# Patient Record
Sex: Male | Born: 1990 | Race: Black or African American | Hispanic: No | Marital: Single | Smoking: Never smoker
Health system: Southern US, Community
[De-identification: ages and names within clinical notes are randomized; demographics above are authoritative.]

## PROBLEM LIST (undated history)

## (undated) DIAGNOSIS — Z9109 Other allergy status, other than to drugs and biological substances: Secondary | ICD-10-CM

## (undated) DIAGNOSIS — L729 Follicular cyst of the skin and subcutaneous tissue, unspecified: Secondary | ICD-10-CM

## (undated) HISTORY — PX: COLON SURGERY: SHX602

## (undated) SURGERY — Surgical Case
Anesthesia: *Unknown

---
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
## 2012-10-01 ENCOUNTER — Encounter (HOSPITAL_COMMUNITY): Payer: Self-pay | Admitting: *Deleted

## 2012-10-01 ENCOUNTER — Emergency Department (HOSPITAL_COMMUNITY)
Admission: EM | Admit: 2012-10-01 | Discharge: 2012-10-02 | Disposition: A | Discharge status: Home / Self Care | Payer: Self-pay | Attending: Emergency Medicine | Admitting: Emergency Medicine

## 2012-10-01 DIAGNOSIS — J3489 Other specified disorders of nose and nasal sinuses: Secondary | ICD-10-CM | POA: Insufficient documentation

## 2012-10-01 DIAGNOSIS — J209 Acute bronchitis, unspecified: Secondary | ICD-10-CM | POA: Insufficient documentation

## 2012-10-01 DIAGNOSIS — R0981 Nasal congestion: Secondary | ICD-10-CM

## 2012-10-01 DIAGNOSIS — R0789 Other chest pain: Secondary | ICD-10-CM | POA: Insufficient documentation

## 2012-10-01 DIAGNOSIS — R05 Cough: Secondary | ICD-10-CM | POA: Insufficient documentation

## 2012-10-01 DIAGNOSIS — Z9109 Other allergy status, other than to drugs and biological substances: Secondary | ICD-10-CM | POA: Insufficient documentation

## 2012-10-01 DIAGNOSIS — R059 Cough, unspecified: Secondary | ICD-10-CM | POA: Insufficient documentation

## 2012-10-01 DIAGNOSIS — J40 Bronchitis, not specified as acute or chronic: Secondary | ICD-10-CM

## 2012-10-01 HISTORY — DX: Other allergy status, other than to drugs and biological substances: Z91.09

## 2012-10-01 MED ORDER — ALBUTEROL SULFATE HFA 108 (90 BASE) MCG/ACT IN AERS
2.0000 | INHALATION_SPRAY | RESPIRATORY_TRACT | Status: DC | PRN
  Administered 2012-10-01: 2 via RESPIRATORY_TRACT
  Filled 2012-10-01: qty 6.7

## 2012-10-01 MED ORDER — AEROCHAMBER PLUS W/MASK MISC
Status: AC
  Administered 2012-10-01: 1
  Filled 2012-10-01: qty 1

## 2012-10-01 NOTE — ED Notes (Addendum)
C/o cough congestion sob. Onset last week. "can't get a good breath". Throat sore from coughing. Wearing mask. No active coughing at this time. Nasal congestion noted. LS CTA, calm, NAD, alert, speaking in clear complete sentences, throat mildly swollen pink and irritated, VSS.

## 2012-10-01 NOTE — ED Provider Notes (Signed)
History     CSN: [id number]  Arrival date & time 10/01/12  2311   First MD Initiated Contact with Patient 10/01/12 2333      Chief Complaint  Patient presents with  . Shortness of Breath  . Nasal Congestion    (Consider location/radiation/quality/duration/timing/severity/associated sxs/prior treatment) HPI Comments: Patient with SOB on exertion and laying down. He tried his mothers neb machine and it did help for a short period of time. Has no Hx asthma.  States he has facial congestion but no rhinitis of Hx seasonal allergies   Patient is a 22 y.o. male presenting with shortness of breath. The history is provided by the patient.  Shortness of Breath Severity:  Mild Onset quality:  Unable to specify Duration:  10 days Timing:  Intermittent Progression:  Unchanged Chronicity:  New Context: not activity   Relieved by:  Rest Worsened by:  Activity Ineffective treatments:  None tried Associated symptoms: cough   Associated symptoms: no chest pain, no fever, no headaches, no vomiting and no wheezing     Past Medical History  Diagnosis Date  . Environmental allergies     Past Surgical History  Procedure Laterality Date  . Colon surgery      No family history on file.  History  Substance Use Topics  . Smoking status: Never Smoker   . Smokeless tobacco: Not on file  . Alcohol Use: No      Review of Systems  Constitutional: Negative for fever and chills.  HENT: Positive for congestion. Negative for rhinorrhea.   Respiratory: Positive for cough, chest tightness and shortness of breath. Negative for wheezing.   Cardiovascular: Negative for chest pain.  Gastrointestinal: Negative for nausea and vomiting.  Musculoskeletal: Negative for myalgias.  Neurological: Negative for dizziness, weakness and headaches.  All other systems reviewed and are negative.    Allergies  Eggs or egg-derived products and Peanut-containing drug products  Home Medications  No current  outpatient prescriptions on file.  BP 134/73  Pulse 74  Temp(Src) 98.7 F (37.1 C) (Oral)  Resp 16  Ht 6' (1.829 m)  Wt 210 lb (95.255 kg)  BMI 28.47 kg/m2  SpO2 98%  Physical Exam  Nursing note and vitals reviewed. Constitutional: He is oriented to person, place, and time. He appears well-developed and well-nourished.  HENT:  Head: Normocephalic and atraumatic.  Mouth/Throat: Oropharynx is clear and moist.  Eyes: Pupils are equal, round, and reactive to light.  Neck: Normal range of motion.  Cardiovascular: Normal rate and regular rhythm.   Pulmonary/Chest: Effort normal and breath sounds normal. He has no wheezes. He exhibits no tenderness.  Musculoskeletal: Normal range of motion.  Lymphadenopathy:    He has no cervical adenopathy.  Neurological: He is alert and oriented to person, place, and time.  Skin: Skin is warm and dry. No rash noted.    ED Course  Procedures (including critical care time)  Labs Reviewed - No data to display Dg Chest 2 View  10/02/2012   *RADIOLOGY REPORT*  Clinical Data: Cough and congestion; choking sensation.  CHEST - 2 VIEW  Comparison: None.  Findings: The lungs are well-aerated and clear.  There is no evidence of focal opacification, pleural effusion or pneumothorax.  The heart is normal in size; the mediastinal contour is within normal limits.  No acute osseous abnormalities are seen.  IMPRESSION: No acute cardiopulmonary process seen.   Original Report Authenticated By: Tonia Ghent, M.D.     1. Bronchitis   2. Nasal  sinus congestion       MDM           Arman Filter, NP 10/02/12 0110

## 2012-10-02 ENCOUNTER — Emergency Department (HOSPITAL_COMMUNITY): Payer: Self-pay

## 2012-10-02 NOTE — ED Provider Notes (Signed)
Medical screening examination/treatment/procedure(s) were performed by non-physician practitioner and as supervising physician I was immediately available for consultation/collaboration.  Lyanne Co, MD 10/02/12 [phone]

## 2013-03-30 ENCOUNTER — Emergency Department (HOSPITAL_COMMUNITY)
Admission: EM | Admit: 2013-03-30 | Discharge: 2013-03-30 | Disposition: A | Discharge status: Home / Self Care | Payer: Self-pay | Attending: Emergency Medicine | Admitting: Emergency Medicine

## 2013-03-30 ENCOUNTER — Encounter (HOSPITAL_COMMUNITY): Payer: Self-pay | Admitting: Emergency Medicine

## 2013-03-30 DIAGNOSIS — J069 Acute upper respiratory infection, unspecified: Secondary | ICD-10-CM | POA: Insufficient documentation

## 2013-03-30 MED ORDER — ALBUTEROL SULFATE HFA 108 (90 BASE) MCG/ACT IN AERS
2.0000 | INHALATION_SPRAY | Freq: Four times a day (QID) | RESPIRATORY_TRACT | Status: DC | PRN
  Administered 2013-03-30: 2 via RESPIRATORY_TRACT
  Filled 2013-03-30: qty 6.7

## 2013-03-30 MED ORDER — BENZONATATE 100 MG PO CAPS: 100.0000 mg | ORAL_CAPSULE | Freq: Three times a day (TID) | ORAL | Status: DC

## 2013-03-30 MED ORDER — GUAIFENESIN 100 MG/5ML PO LIQD: 100.0000 mg | ORAL | Status: DC | PRN

## 2013-03-30 NOTE — ED Provider Notes (Signed)
CSN: [id number]     Arrival date & time 03/30/13  1603 History  This chart was scribed for non-physician practitioner, Junius Finner, PA-C,working with Raeford Razor, MD, by Karle Plumber, ED Scribe.  This patient was seen in room TR05C/TR05C and the patient's care was started at 5:34 PM.  Chief Complaint  Patient presents with  . Shortness of Breath  . Nasal Congestion   The history is provided by the patient. No language interpreter was used.   HPI Comments:  Martin Stafford is a 22 y.o. male who presents to the Emergency Department complaining of severe pressure in his chest and severe congestion in his head onset one week. He reports an associated cough that keeps him up at night, rhinorrhea and sore throat. Pt reports taking Sudafed yesterday with no relief. He states everyone he lives with is sick as well. Pt denies h/o asthma but states he has intermittent problems breathing. Pt denies face pain, nausea, vomiting, or fever. He denies any recent travel. He states he does not have a PCP.   Past Medical History  Diagnosis Date  . Environmental allergies    Past Surgical History  Procedure Laterality Date  . Colon surgery     No family history on file. History  Substance Use Topics  . Smoking status: Never Smoker   . Smokeless tobacco: Not on file  . Alcohol Use: No    Review of Systems  HENT: Positive for congestion (nasal), rhinorrhea and sore throat.   Respiratory: Positive for cough and shortness of breath.   All other systems reviewed and are negative.   Allergies  Eggs or egg-derived products and Peanut-containing drug products  Home Medications   Current Outpatient Rx  Name  Route  Sig  Dispense  Refill  . benzonatate (TESSALON) 100 MG capsule   Oral   Take 1 capsule (100 mg total) by mouth every 8 (eight) hours.   21 capsule   0   . guaiFENesin (ROBITUSSIN) 100 MG/5ML liquid   Oral   Take 5-10 mLs (100-200 mg total) by mouth every 4 (four) hours as  needed for cough.   60 mL   0    Triage Vitals: BP 147/86  Pulse 87  Temp(Src) 98.4 F (36.9 C) (Oral)  Resp 18  Wt 218 lb 12.8 oz (99.247 kg)  SpO2 97% Physical Exam  Nursing note and vitals reviewed. Constitutional: He is oriented to person, place, and time. He appears well-developed and well-nourished.  HENT:  Head: Normocephalic and atraumatic.  Right Ear: Hearing, tympanic membrane, external ear and ear canal normal.  Left Ear: Hearing, tympanic membrane, external ear and ear canal normal.  Nose: Mucosal edema and rhinorrhea present. Right sinus exhibits no maxillary sinus tenderness and no frontal sinus tenderness. Left sinus exhibits no maxillary sinus tenderness and no frontal sinus tenderness.  Mouth/Throat: Uvula is midline, oropharynx is clear and moist and mucous membranes are normal. Mucous membranes are not pale, not dry and not cyanotic. He does not have dentures. No oral lesions. No trismus in the jaw. Normal dentition. No dental abscesses, uvula swelling, lacerations or dental caries. No oropharyngeal exudate, posterior oropharyngeal edema, posterior oropharyngeal erythema or tonsillar abscesses.  Eyes: EOM are normal.  Neck: Normal range of motion.  Cardiovascular: Normal rate.   Pulmonary/Chest: Effort normal and breath sounds normal. No respiratory distress. He has no wheezes. He has no rales.  Clear to auscultation bilaterally.  Musculoskeletal: Normal range of motion.  Neurological: He is alert  and oriented to person, place, and time.  Skin: Skin is warm and dry.  Psychiatric: He has a normal mood and affect. His behavior is normal.    ED Course  Procedures (including critical care time) DIAGNOSTIC STUDIES: Oxygen Saturation is 97% on RA, normal by my interpretation.   COORDINATION OF CARE: 5:37 PM- Will prescribe a cough medicine. Pt requests prescription for an inhaler. Will provide work note. Advised pt to rest and stay well-hydrated. Also advised to f/u  with PCP to discuss possible need for nebulizer machine at home as pt is concerned he needs one. Pt verbalizes understanding and agrees to plan.  Medications - No data to display  Labs Review Labs Reviewed - No data to display Imaging Review No results found.  EKG Interpretation   None       MDM   1. URI, acute     I personally performed the services described in this documentation, which was scribed in my presence. The recorded information has been reviewed and is accurate.    Junius Finner, PA-C 03/30/13 2340

## 2013-03-30 NOTE — ED Notes (Signed)
Pt discharged.Vital signs stable and GCS 15.Discharge instruction given. 

## 2013-03-30 NOTE — ED Notes (Signed)
C/o sob, runny nose, nasal congestion, and sore throat x 1 week.  States symptoms worse since last night.

## 2013-03-31 NOTE — ED Provider Notes (Signed)
Medical screening examination/treatment/procedure(s) were performed by non-physician practitioner and as supervising physician I was immediately available for consultation/collaboration.  EKG Interpretation   None        Raeford Razor, MD 03/31/13 [phone]

## 2014-10-03 ENCOUNTER — Emergency Department (HOSPITAL_COMMUNITY)
Admission: EM | Admit: 2014-10-03 | Discharge: 2014-10-04 | Disposition: A | Discharge status: Home / Self Care | Payer: Self-pay | Attending: Emergency Medicine | Admitting: Emergency Medicine

## 2014-10-03 ENCOUNTER — Encounter (HOSPITAL_COMMUNITY): Payer: Self-pay | Admitting: *Deleted

## 2014-10-03 DIAGNOSIS — S46819A Strain of other muscles, fascia and tendons at shoulder and upper arm level, unspecified arm, initial encounter: Secondary | ICD-10-CM

## 2014-10-03 DIAGNOSIS — S0990XA Unspecified injury of head, initial encounter: Secondary | ICD-10-CM | POA: Insufficient documentation

## 2014-10-03 DIAGNOSIS — S46919A Strain of unspecified muscle, fascia and tendon at shoulder and upper arm level, unspecified arm, initial encounter: Secondary | ICD-10-CM | POA: Insufficient documentation

## 2014-10-03 DIAGNOSIS — Y92410 Unspecified street and highway as the place of occurrence of the external cause: Secondary | ICD-10-CM | POA: Insufficient documentation

## 2014-10-03 DIAGNOSIS — Y9389 Activity, other specified: Secondary | ICD-10-CM | POA: Insufficient documentation

## 2014-10-03 DIAGNOSIS — Y998 Other external cause status: Secondary | ICD-10-CM | POA: Insufficient documentation

## 2014-10-03 DIAGNOSIS — S29092A Other injury of muscle and tendon of back wall of thorax, initial encounter: Secondary | ICD-10-CM | POA: Insufficient documentation

## 2014-10-03 DIAGNOSIS — S199XXA Unspecified injury of neck, initial encounter: Secondary | ICD-10-CM | POA: Insufficient documentation

## 2014-10-03 MED ORDER — NAPROXEN 500 MG PO TABS: 500.0000 mg | ORAL_TABLET | Freq: Two times a day (BID) | ORAL | Status: DC

## 2014-10-03 MED ORDER — HYDROCODONE-ACETAMINOPHEN 5-325 MG PO TABS: 1.0000 | ORAL_TABLET | ORAL | Status: DC | PRN

## 2014-10-03 MED ORDER — NAPROXEN 500 MG PO TABS
500.0000 mg | ORAL_TABLET | Freq: Once | ORAL | Status: AC
  Administered 2014-10-03: 500 mg via ORAL
  Filled 2014-10-03: qty 1

## 2014-10-03 MED ORDER — HYDROCODONE-ACETAMINOPHEN 5-325 MG PO TABS
1.0000 | ORAL_TABLET | Freq: Once | ORAL | Status: AC
  Administered 2014-10-03: 1 via ORAL
  Filled 2014-10-03: qty 1

## 2014-10-03 NOTE — Discharge Instructions (Signed)
Please follow the directions provided. Be sure to follow-up with your primary care doctor to make sure you're getting better. You may use the naproxen twice a day for pain and inflammation. You may use the Vicodin for pain not relieved by the naproxen. May use ice and heat to help with discomfort. Don't hesitate to return for any new, worsening, or concerning symptoms.    SEEK IMMEDIATE MEDICAL CARE IF:  You have numbness, tingling, or weakness in the arms or legs.  You develop severe headaches not relieved with medicine.  You have severe neck pain, especially tenderness in the middle of the back of your neck.  You have changes in bowel or bladder control.  There is increasing pain in any area of the body.  You have shortness of breath, light-headedness, dizziness, or fainting.  You have chest pain.  You feel sick to your stomach (nauseous), throw up (vomit), or sweat.  You have increasing abdominal discomfort.  There is blood in your urine, stool, or vomit.  You have pain in your shoulder (shoulder strap areas).  You feel your symptoms are getting worse.
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
## 2014-10-03 NOTE — ED Notes (Signed)
Called twice, no response
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
## 2014-12-05 ENCOUNTER — Encounter (HOSPITAL_COMMUNITY): Payer: Self-pay | Admitting: Emergency Medicine

## 2014-12-05 ENCOUNTER — Emergency Department (HOSPITAL_COMMUNITY)
Admission: EM | Admit: 2014-12-05 | Discharge: 2014-12-06 | Disposition: A | Discharge status: Home / Self Care | Payer: Self-pay | Attending: Emergency Medicine | Admitting: Emergency Medicine

## 2014-12-05 DIAGNOSIS — K219 Gastro-esophageal reflux disease without esophagitis: Secondary | ICD-10-CM | POA: Insufficient documentation

## 2014-12-05 DIAGNOSIS — R519 Headache, unspecified: Secondary | ICD-10-CM

## 2014-12-05 DIAGNOSIS — R51 Headache: Secondary | ICD-10-CM | POA: Insufficient documentation

## 2014-12-05 NOTE — ED Notes (Signed)
Pt states for the past week he has been having a lot of acid in his throat and has been unable to eat or drink a lot of fluids because it hurts his esophagus  Pt states he has not eaten in three days  Pt states he has used alka seltzer that helps for a short time but then it hurts worse when it wears off  Pt states he has not tried OTC acid reducers like zantac or prilosec  Pt states he has been having headaches lately too

## 2014-12-06 MED ORDER — IBUPROFEN 800 MG PO TABS
800.0000 mg | ORAL_TABLET | Freq: Once | ORAL | Status: AC
  Administered 2014-12-06: 800 mg via ORAL
  Filled 2014-12-06: qty 1

## 2014-12-06 MED ORDER — GI COCKTAIL ~~LOC~~
30.0000 mL | Freq: Once | ORAL | Status: AC
  Administered 2014-12-06: 30 mL via ORAL
  Filled 2014-12-06: qty 30

## 2014-12-06 MED ORDER — OMEPRAZOLE 20 MG PO CPDR: 20.0000 mg | DELAYED_RELEASE_CAPSULE | Freq: Every day | ORAL | Status: DC

## 2014-12-06 NOTE — ED Notes (Signed)
Patient would like a doctor's note with his discharge paperwork.

## 2014-12-06 NOTE — ED Provider Notes (Signed)
CSN: [id number]     Arrival date & time 12/05/14  2005 History   First MD Initiated Contact with Patient 12/06/14 0005     Chief Complaint  Patient presents with  . Gastrophageal Reflux     (Consider location/radiation/quality/duration/timing/severity/associated sxs/prior Treatment) HPI Comments: Patient is a 24 year old male past medical history significant for headaches, environmental allergies presenting to the emergency department for evaluation of 2 complaints. Patient's first complaint is 4 days of burning epigastric pain with radiation to his chest and throat. He states he feels like he has been tasting acid in his throat. He states eating and drinking worsen his pain. States he has tried Alka-Seltzer helps for short time but eventually wears off. Patient is also complaining of a throbbing headache that he has had on and off for several days. He endorses a history of headaches similar to this one. States he normally takes ibuprofen with relief, has not tried anything in 4 days for his headache. Denies any fevers, nausea, vomiting, syncope.  Patient is a 24 y.o. male presenting with GERD.  Gastrophageal Reflux Associated symptoms include abdominal pain, chest pain and headaches.    Past Medical History  Diagnosis Date  . Environmental allergies    Past Surgical History  Procedure Laterality Date  . Colon surgery     Family History  Problem Relation Age of Onset  . Hypertension Father   . Hypertension Other   . Diabetes Other   . Asthma Mother    History  Substance Use Topics  . Smoking status: Never Smoker   . Smokeless tobacco: Not on file  . Alcohol Use: No    Review of Systems  Eyes: Negative for photophobia and visual disturbance.  Cardiovascular: Positive for chest pain.  Gastrointestinal: Positive for abdominal pain.  Neurological: Positive for headaches.  All other systems reviewed and are negative.     Allergies  Eggs or egg-derived products and  Peanut-containing drug products  Home Medications   Prior to Admission medications   Medication Sig Start Date End Date Taking? Authorizing Provider  aspirin-sod bicarb-citric acid (ALKA-SELTZER) 325 MG TBEF tablet Take 325 mg by mouth every 6 (six) hours as needed (acid reflux).   Yes Historical Provider, MD  benzonatate (TESSALON) 100 MG capsule Take 1 capsule (100 mg total) by mouth every 8 (eight) hours. Patient not taking: Reported on 12/06/2014 03/30/13   Junius Finner, PA-C  guaiFENesin (ROBITUSSIN) 100 MG/5ML liquid Take 5-10 mLs (100-200 mg total) by mouth every 4 (four) hours as needed for cough. Patient not taking: Reported on 12/06/2014 03/30/13   Junius Finner, PA-C  HYDROcodone-acetaminophen (NORCO/VICODIN) 5-325 MG per tablet Take 1 tablet by mouth every 4 (four) hours as needed. Patient not taking: Reported on 12/06/2014 10/03/14   Harle Battiest, NP  naproxen (NAPROSYN) 500 MG tablet Take 1 tablet (500 mg total) by mouth 2 (two) times daily. Patient not taking: Reported on 12/06/2014 10/03/14   Harle Battiest, NP  omeprazole (PRILOSEC) 20 MG capsule Take 1 capsule (20 mg total) by mouth daily. 12/06/14   Jannet Calip, PA-C   BP 146/80 mmHg  Pulse 76  Temp(Src) 98.1 F (36.7 C) (Oral)  Resp 16  SpO2 99% Physical Exam  Constitutional: He is oriented to person, place, and time. He appears well-developed and well-nourished. No distress.  HENT:  Head: Normocephalic and atraumatic.  Right Ear: External ear normal.  Left Ear: External ear normal.  Nose: Nose normal.  Mouth/Throat: Oropharynx is clear and moist.  Eyes: Conjunctivae  are normal.  Neck: Normal range of motion. Neck supple.  No nuchal rigidity.   Cardiovascular: Normal rate, regular rhythm and normal heart sounds.   Pulmonary/Chest: Effort normal and breath sounds normal. No respiratory distress.  Abdominal: Soft. Bowel sounds are normal. There is no tenderness. There is no guarding.   Musculoskeletal: Normal range of motion. He exhibits no edema.  Neurological: He is alert and oriented to person, place, and time.  Skin: Skin is warm and dry. He is not diaphoretic.  Psychiatric: He has a normal mood and affect.  Nursing note and vitals reviewed.   ED Course  Procedures (including critical care time) Medications  ibuprofen (ADVIL,MOTRIN) tablet 800 mg (800 mg Oral Given 12/06/14 0134)  gi cocktail (Maalox,Lidocaine,Donnatal) (30 mLs Oral Given 12/06/14 0134)    Labs Review Labs Reviewed - No data to display  Imaging Review No results found.   EKG Interpretation None      MDM   Final diagnoses:  Gastroesophageal reflux disease, esophagitis presence not specified  Acute nonintractable headache, unspecified headache type   Filed Vitals:   12/06/14 0246  BP: 146/80  Pulse: 76  Temp:   Resp:    Afebrile, NAD, non-toxic appearing, AAOx4.   1) GERD: Symptoms consistent with acid reflux. Low clinical suspicion for cardiac or pulmonary cause of pain. GI cocktail given with improvement. Will start on trial of omeprazole with advised PCP f/u. Symptomatic measures and dietary measures discussed with patient.  2) HA: Pt HA treated and improved while in ED.  Presentation is like pts typical HA and non concerning for [facility], ICH, Meningitis, or temporal arteritis. Pt is afebrile with no focal neuro deficits, nuchal rigidity, or change in vision.   Pt is to follow up with PCP to discuss prophylactic medication. Pt verbalizes understanding and is agreeable with plan to dc.     Francee Piccolo, PA-C 12/06/14 6045  Marisa Severin, MD 12/07/14 [phone]

## 2014-12-06 NOTE — Discharge Instructions (Signed)
Please follow up with your primary care physician in 1-2 days. If you do not have one please call the [facility] number listed above.Please read all discharge instructions and return precautions.   Gastroesophageal Reflux Disease, Adult Gastroesophageal reflux disease (GERD) happens when acid from your stomach flows up into the esophagus. When acid comes in contact with the esophagus, the acid causes soreness (inflammation) in the esophagus. Over time, GERD may create small holes (ulcers) in the lining of the esophagus. CAUSES   Increased body weight. This puts pressure on the stomach, making acid rise from the stomach into the esophagus.  Smoking. This increases acid production in the stomach.  Drinking alcohol. This causes decreased pressure in the lower esophageal sphincter (valve or ring of muscle between the esophagus and stomach), allowing acid from the stomach into the esophagus.  Late evening meals and a full stomach. This increases pressure and acid production in the stomach.  A malformed lower esophageal sphincter. Sometimes, no cause is found. SYMPTOMS   Burning pain in the lower part of the mid-chest behind the breastbone and in the mid-stomach area. This may occur twice a week or more often.  Trouble swallowing.  Sore throat.  Dry cough.  Asthma-like symptoms including chest tightness, shortness of breath, or wheezing. DIAGNOSIS  Your caregiver may be able to diagnose GERD based on your symptoms. In some cases, X-rays and other tests may be done to check for complications or to check the condition of your stomach and esophagus. TREATMENT  Your caregiver may recommend over-the-counter or prescription medicines to help decrease acid production. Ask your caregiver before starting or adding any new medicines.  HOME CARE INSTRUCTIONS   Change the factors that you can control. Ask your caregiver for guidance concerning weight loss, quitting smoking, and  alcohol consumption.  Avoid foods and drinks that make your symptoms worse, such as:  Caffeine or alcoholic drinks.  Chocolate.  Peppermint or mint flavorings.  Garlic and onions.  Spicy foods.  Citrus fruits, such as oranges, lemons, or limes.  Tomato-based foods such as sauce, chili, salsa, and pizza.  Fried and fatty foods.  Avoid lying down for the 3 hours prior to your bedtime or prior to taking a nap.  Eat small, frequent meals instead of large meals.  Wear loose-fitting clothing. Do not wear anything tight around your waist that causes pressure on your stomach.  Raise the head of your bed 6 to 8 inches with wood blocks to help you sleep. Extra pillows will not help.  Only take over-the-counter or prescription medicines for pain, discomfort, or fever as directed by your caregiver.  Do not take aspirin, ibuprofen, or other nonsteroidal anti-inflammatory drugs (NSAIDs). SEEK IMMEDIATE MEDICAL CARE IF:   You have pain in your arms, neck, jaw, teeth, or back.  Your pain increases or changes in intensity or duration.  You develop nausea, vomiting, or sweating (diaphoresis).  You develop shortness of breath, or you faint.  Your vomit is green, yellow, black, or looks like coffee grounds or blood.  Your stool is red, bloody, or black. These symptoms could be signs of other problems, such as heart disease, gastric bleeding, or esophageal bleeding. MAKE SURE YOU:   Understand these instructions.  Will watch your condition.  Will get help right away if you are not doing well or get worse. Document Released: 02/05/2005 Document Revised: 07/21/2011 Document Reviewed: 11/15/2010 [facility] Patient Information 2015 [place], [place]. This information is not intended to replace advice  given to you by your health care provider. Make sure you discuss any questions you have with your health care provider. Food Choices for Gastroesophageal Reflux Disease When you have  gastroesophageal reflux disease (GERD), the foods you eat and your eating habits are very important. Choosing the right foods can help ease the discomfort of GERD. WHAT GENERAL GUIDELINES DO I NEED TO FOLLOW?  Choose fruits, vegetables, whole grains, low-fat dairy products, and low-fat meat, fish, and poultry.  Limit fats such as oils, salad dressings, butter, nuts, and avocado.  Keep a food diary to identify foods that cause symptoms.  Avoid foods that cause reflux. These may be different for different people.  Eat frequent small meals instead of three large meals each day.  Eat your meals slowly, in a relaxed setting.  Limit fried foods.  Cook foods using methods other than frying.  Avoid drinking alcohol.  Avoid drinking large amounts of liquids with your meals.  Avoid bending over or lying down until 2-3 hours after eating. WHAT FOODS ARE NOT RECOMMENDED? The following are some foods and drinks that may worsen your symptoms: Vegetables Tomatoes. Tomato juice. Tomato and spaghetti sauce. Chili peppers. Onion and garlic. Horseradish. Fruits Oranges, grapefruit, and lemon (fruit and juice). Meats High-fat meats, fish, and poultry. This includes hot dogs, ribs, ham, sausage, salami, and bacon. Dairy Whole milk and chocolate milk. Sour cream. Cream. Butter. Ice cream. Cream cheese.  Beverages Coffee and tea, with or without caffeine. Carbonated beverages or energy drinks. Condiments Hot sauce. Barbecue sauce.  Sweets/Desserts Chocolate and cocoa. Donuts. Peppermint and spearmint. Fats and Oils High-fat foods, including [place] fries and potato chips. Other Vinegar. Strong spices, such as black pepper, white pepper, red pepper, cayenne, curry powder, cloves, ginger, and chili powder. The items listed above may not be a complete list of foods and beverages to avoid. Contact your dietitian for more information. Document Released: 04/28/2005 Document Revised: 05/03/2013  Document Reviewed: 03/02/2013 [facility] Patient Information 2015 [place], [place]. This information is not intended to replace advice given to you by your health care provider. Make sure you discuss any questions you have with your health care provider.

## 2015-10-28 ENCOUNTER — Emergency Department (HOSPITAL_COMMUNITY): Payer: No Typology Code available for payment source

## 2015-10-28 ENCOUNTER — Emergency Department (HOSPITAL_COMMUNITY)
Admission: EM | Admit: 2015-10-28 | Discharge: 2015-10-28 | Disposition: A | Discharge status: Home / Self Care | Payer: No Typology Code available for payment source | Attending: Emergency Medicine | Admitting: Emergency Medicine

## 2015-10-28 ENCOUNTER — Encounter (HOSPITAL_COMMUNITY): Payer: Self-pay | Admitting: Emergency Medicine

## 2015-10-28 DIAGNOSIS — M545 Low back pain, unspecified: Secondary | ICD-10-CM

## 2015-10-28 DIAGNOSIS — M79602 Pain in left arm: Secondary | ICD-10-CM | POA: Insufficient documentation

## 2015-10-28 DIAGNOSIS — Y9389 Activity, other specified: Secondary | ICD-10-CM | POA: Insufficient documentation

## 2015-10-28 DIAGNOSIS — Z79899 Other long term (current) drug therapy: Secondary | ICD-10-CM | POA: Insufficient documentation

## 2015-10-28 DIAGNOSIS — Z791 Long term (current) use of non-steroidal anti-inflammatories (NSAID): Secondary | ICD-10-CM | POA: Insufficient documentation

## 2015-10-28 DIAGNOSIS — Y999 Unspecified external cause status: Secondary | ICD-10-CM | POA: Insufficient documentation

## 2015-10-28 DIAGNOSIS — Y92410 Unspecified street and highway as the place of occurrence of the external cause: Secondary | ICD-10-CM | POA: Insufficient documentation

## 2015-10-28 DIAGNOSIS — Z7982 Long term (current) use of aspirin: Secondary | ICD-10-CM | POA: Insufficient documentation

## 2015-10-28 MED ORDER — IBUPROFEN 800 MG PO TABS: 800.0000 mg | ORAL_TABLET | Freq: Three times a day (TID) | ORAL | Status: DC

## 2015-10-28 MED ORDER — IBUPROFEN 800 MG PO TABS
800.0000 mg | ORAL_TABLET | Freq: Once | ORAL | Status: AC
  Administered 2015-10-28: 800 mg via ORAL
  Filled 2015-10-28: qty 1

## 2015-10-28 MED ORDER — METHOCARBAMOL 500 MG PO TABS: 500.0000 mg | ORAL_TABLET | Freq: Two times a day (BID) | ORAL | Status: DC
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
## 2017-07-27 ENCOUNTER — Emergency Department (INDEPENDENT_AMBULATORY_CARE_PROVIDER_SITE_OTHER): Payer: BLUE CROSS/BLUE SHIELD

## 2017-07-27 ENCOUNTER — Encounter: Payer: Self-pay | Admitting: Emergency Medicine

## 2017-07-27 ENCOUNTER — Emergency Department (INDEPENDENT_AMBULATORY_CARE_PROVIDER_SITE_OTHER)
Admission: EM | Admit: 2017-07-27 | Discharge: 2017-07-27 | Disposition: A | Discharge status: Home / Self Care | Payer: BLUE CROSS/BLUE SHIELD | Source: Home / Self Care | Attending: Emergency Medicine | Admitting: Emergency Medicine

## 2017-07-27 DIAGNOSIS — J0120 Acute ethmoidal sinusitis, unspecified: Secondary | ICD-10-CM | POA: Diagnosis not present

## 2017-07-27 DIAGNOSIS — R05 Cough: Secondary | ICD-10-CM

## 2017-07-27 DIAGNOSIS — R509 Fever, unspecified: Secondary | ICD-10-CM | POA: Diagnosis not present

## 2017-07-27 MED ORDER — AMOXICILLIN 500 MG PO CAPS: 500.0000 mg | ORAL_CAPSULE | Freq: Three times a day (TID) | ORAL | 0 refills | Status: DC

## 2017-07-27 MED ORDER — IBUPROFEN 800 MG PO TABS: 800.0000 mg | ORAL_TABLET | Freq: Three times a day (TID) | ORAL | 0 refills | Status: DC

## 2017-07-27 NOTE — ED Provider Notes (Signed)
[facility]    CSN: [id number] Arrival date & time: 07/27/17  1234     History   Chief Complaint Chief Complaint  Patient presents with  . Chills    HPI Martin Stafford is a 27 y.o. male.   The history is provided by the patient. No language interpreter was used.  Fever  Max temp prior to arrival:  101 Temp source:  Subjective Severity:  Moderate Onset quality:  Gradual Duration:  1 week Timing:  Constant Progression:  Worsening Chronicity:  New Relieved by:  Nothing Worsened by:  Nothing Ineffective treatments:  None tried Associated symptoms: chills   Associated symptoms: no chest pain   Risk factors: recent sickness   Risk factors: no contaminated food, no contaminated water, no hx of cancer, no immunosuppression and no sick contacts    Pt was diagnosed with a viral illness at [facility] 5 days ago.  Flu test was negative strep was negative.  Pt complains of continued headache,  Fever on and off and chills and sweating at night. Past Medical History:  Diagnosis Date  . Environmental allergies     There are no active problems to display for this patient.   Past Surgical History:  Procedure Laterality Date  . COLON SURGERY         Home Medications    Prior to Admission medications   Medication Sig Start Date End Date Taking? Authorizing Provider  amoxicillin (AMOXIL) 500 MG capsule Take 1 capsule (500 mg total) by mouth 3 (three) times daily. 07/27/17   Elson Areas, PA-C  aspirin-sod bicarb-citric acid (ALKA-SELTZER) 325 MG TBEF tablet Take 325 mg by mouth every 6 (six) hours as needed (acid reflux).    [provider]  benzonatate (TESSALON) 100 MG capsule Take 1 capsule (100 mg total) by mouth every 8 (eight) hours. Patient not taking: Reported on 12/06/2014 03/30/13   Lurene Shadow, PA-C  guaiFENesin (ROBITUSSIN) 100 MG/5ML liquid Take 5-10 mLs (100-200 mg total) by mouth every 4 (four) hours as needed for cough. Patient not  taking: Reported on 12/06/2014 03/30/13   Lurene Shadow, PA-C  HYDROcodone-acetaminophen (NORCO/VICODIN) 5-325 MG per tablet Take 1 tablet by mouth every 4 (four) hours as needed. Patient not taking: Reported on 12/06/2014 10/03/14   Harle Battiest, NP  ibuprofen (ADVIL,MOTRIN) 800 MG tablet Take 1 tablet (800 mg total) by mouth 3 (three) times daily. 07/27/17   Elson Areas, PA-C  methocarbamol (ROBAXIN) 500 MG tablet Take 1 tablet (500 mg total) by mouth 2 (two) times daily. 10/28/15   Barrett, Rolm Gala, PA-C  naproxen (NAPROSYN) 500 MG tablet Take 1 tablet (500 mg total) by mouth 2 (two) times daily. Patient not taking: Reported on 12/06/2014 10/03/14   Harle Battiest, NP  omeprazole (PRILOSEC) 20 MG capsule Take 1 capsule (20 mg total) by mouth daily. 12/06/14   Piepenbrink, Victorino Dike, PA-C    Family History Family History  Problem Relation Age of Onset  . Hypertension Father   . Hypertension Other   . Diabetes Other   . Asthma Mother     Social History Social History   Tobacco Use  . Smoking status: Never Smoker  . Smokeless tobacco: Never Used  Substance Use Topics  . Alcohol use: No  . Drug use: No     Allergies   Eggs or egg-derived products and Peanut-containing drug products   Review of Systems Review of Systems  Constitutional: Positive for chills and fever.  Cardiovascular: Negative  for chest pain.  All other systems reviewed and are negative.    Physical Exam Triage Vital Signs ED Triage Vitals [07/27/17 1315]  Enc Vitals Group     BP 129/81     Pulse Rate 76     Resp      Temp 97.8 F (36.6 C)     Temp Source Oral     SpO2 99 %     Weight 233 lb (105.7 kg)     Height      Head Circumference      Peak Flow      Pain Score 0     Pain Loc      Pain Edu?      Excl. in GC?    No data found.  Updated Vital Signs BP 129/81 (BP Location: Right Arm)   Pulse 76   Temp 97.8 F (36.6 C) (Oral)   Wt 233 lb (105.7 kg)   SpO2 99%   BMI 31.60  kg/m   Visual Acuity Right Eye Distance:   Left Eye Distance:   Bilateral Distance:    Right Eye Near:   Left Eye Near:    Bilateral Near:     Physical Exam  Constitutional: He appears well-developed and well-nourished.  HENT:  Head: Normocephalic and atraumatic.  Eyes: Conjunctivae are normal.  Neck: Neck supple.  Cardiovascular: Normal rate and regular rhythm.  No murmur heard. Pulmonary/Chest: Effort normal and breath sounds normal. No respiratory distress.  Abdominal: Soft. There is no tenderness.  Musculoskeletal: He exhibits no edema.  Neurological: He is alert.  Skin: Skin is warm and dry.  Psychiatric: He has a normal mood and affect.  Nursing note and vitals reviewed.    UC Treatments / Results  Labs (all labs ordered are listed, but only abnormal results are displayed) Labs Reviewed - No data to display  EKG  EKG Interpretation None       Radiology Dg Chest 2 View  Result Date: 07/27/2017 CLINICAL DATA:  Cough and fever for the past week. EXAM: CHEST - 2 VIEW COMPARISON:  Chest x-ray dated Oct 02, 2012. FINDINGS: The heart size and mediastinal contours are within normal limits. Both lungs are clear. The visualized skeletal structures are unremarkable. IMPRESSION: No active cardiopulmonary disease. Electronically Signed   By: Obie Dredge M.D.   On: 07/27/2017 13:35    Procedures Procedures (including critical care time)  Medications Ordered in UC Medications - No data to display   Initial Impression / Assessment and Plan / UC Course  I have reviewed the triage vital signs and the nursing notes.  Pertinent labs & imaging results that were available during my care of the patient were reviewed by me and considered in my medical decision making (see chart for details).     MDM pt looks well chest xray is normal.  No signs of meningitis.  Pt may have sinus issues causing his headache.  I will treat him with amoxidcillian and ibuprofen.  I advised  1 week recheck if not resolved.  To ED if symptoms worsen An After Visit Summary was printed and given to the patient. Meds ordered this encounter  Medications  . ibuprofen (ADVIL,MOTRIN) 800 MG tablet    Sig: Take 1 tablet (800 mg total) by mouth 3 (three) times daily.    Dispense:  21 tablet    Refill:  0    Order Specific Question:   Supervising Provider    Answer:   Georgina Pillion, DAVID [  [id number]]  . amoxicillin (AMOXIL) 500 MG capsule    Sig: Take 1 capsule (500 mg total) by mouth 3 (three) times daily.    Dispense:  30 capsule    Refill:  0    Order Specific Question:   Supervising Provider    Answer:   Georgina PillionMASSEY, DAVID [[id number]]     Final Clinical Impressions(s) / UC Diagnoses   Final diagnoses:  Acute ethmoidal sinusitis, recurrence not specified    ED Discharge Orders        Ordered    ibuprofen (ADVIL,MOTRIN) 800 MG tablet  3 times daily     07/27/17 1341    amoxicillin (AMOXIL) 500 MG capsule  3 times daily     07/27/17 1341       Controlled Substance Prescriptions [place] Controlled Substance Registry consulted? Not Applicable   Elson AreasSofia, Lenzi Marmo K, [place]PA-C 07/27/17 1500

## 2017-07-27 NOTE — Discharge Instructions (Signed)
See your Physician for recheck in 1 week  °

## 2017-07-27 NOTE — ED Triage Notes (Signed)
Pt c/o chills and HA since last wed. States he was seen at [facility] for a syncope episode. States All labs WNL.

## 2017-07-29 ENCOUNTER — Other Ambulatory Visit: Payer: Self-pay

## 2017-07-29 ENCOUNTER — Encounter: Payer: Self-pay | Admitting: *Deleted

## 2017-07-29 ENCOUNTER — Telehealth: Payer: Self-pay | Admitting: *Deleted

## 2017-07-29 ENCOUNTER — Emergency Department (INDEPENDENT_AMBULATORY_CARE_PROVIDER_SITE_OTHER)
Admission: EM | Admit: 2017-07-29 | Discharge: 2017-07-29 | Disposition: A | Discharge status: Home / Self Care | Payer: BLUE CROSS/BLUE SHIELD | Source: Home / Self Care | Attending: Family Medicine | Admitting: Family Medicine

## 2017-07-29 DIAGNOSIS — H8110 Benign paroxysmal vertigo, unspecified ear: Secondary | ICD-10-CM | POA: Diagnosis not present

## 2017-07-29 MED ORDER — MECLIZINE HCL 25 MG PO TABS: ORAL_TABLET | ORAL | 0 refills | Status: DC

## 2017-07-29 NOTE — ED Provider Notes (Signed)
[name]    CSN: [id number] Arrival date & time: 07/29/17  1322     History   Chief Complaint Chief Complaint  Patient presents with  . Dizziness    HPI Martin Stafford is a 27 y.o. male.   Patient was treated for possible sinusitis two days ago.   Since then his fever and chills have resolved, his energy has improved, and he feels better.  He awoke this morning feeling off balance with changes in position.  No headache.  No nausea/vomiting.   The history is provided by the patient.    Past Medical History:  Diagnosis Date  . Environmental allergies     There are no active problems to display for this patient.   Past Surgical History:  Procedure Laterality Date  . COLON SURGERY         Home Medications    Prior to Admission medications   Medication Sig Start Date End Date Taking? Authorizing Provider  amoxicillin (AMOXIL) 500 MG capsule Take 1 capsule (500 mg total) by mouth 3 (three) times daily. 07/27/17   Elson Areas, PA-C  aspirin-sod bicarb-citric acid (ALKA-SELTZER) 325 MG TBEF tablet Take 325 mg by mouth every 6 (six) hours as needed (acid reflux).    [provider]  ibuprofen (ADVIL,MOTRIN) 800 MG tablet Take 1 tablet (800 mg total) by mouth 3 (three) times daily. 07/27/17   Elson Areas, PA-C  meclizine (ANTIVERT) 25 MG tablet Take one tab by mouth one to two times daily as needed for dizziness 07/29/17   Lattie Haw, MD  methocarbamol (ROBAXIN) 500 MG tablet Take 1 tablet (500 mg total) by mouth 2 (two) times daily. 10/28/15   Barrett, Rolm Gala, PA-C  omeprazole (PRILOSEC) 20 MG capsule Take 1 capsule (20 mg total) by mouth daily. 12/06/14   Piepenbrink, Victorino Dike, PA-C    Family History Family History  Problem Relation Age of Onset  . Hypertension Father   . Hypertension Other   . Diabetes Other   . Asthma Mother     Social History Social History   Tobacco Use  . Smoking status: Never Smoker  . Smokeless  tobacco: Never Used  Substance Use Topics  . Alcohol use: No  . Drug use: No     Allergies   Eggs or egg-derived products and Peanut-containing drug products   Review of Systems Review of Systems   No sore throat No cough No pleuritic pain No wheezing ? nasal congestion No post-nasal drainage No sinus pain/pressure No itchy/red eyes No earache + dizziness No hemoptysis No SOB No fever/chills No nausea No vomiting No abdominal pain No diarrhea No urinary symptoms No skin rash + fatigue No myalgias No headache    Physical Exam Triage Vital Signs ED Triage Vitals  Enc Vitals Group     BP 07/29/17 1340 131/81     Pulse Rate 07/29/17 1340 84     Resp 07/29/17 1340 18     Temp 07/29/17 1340 (!) 97.4 F (36.3 C)     Temp Source 07/29/17 1340 Oral     SpO2 07/29/17 1340 99 %     Weight 07/29/17 1341 235 lb (106.6 kg)     Height 07/29/17 1341 6' (1.829 m)     Head Circumference --      Peak Flow --      Pain Score 07/29/17 1341 0     Pain Loc --      Pain Edu? --  Excl. in GC? --    No data found.  Updated Vital Signs BP 131/81 (BP Location: Right Arm)   Pulse 84   Temp (!) 97.4 F (36.3 C) (Oral)   Resp 18   Ht 6' (1.829 m)   Wt 235 lb (106.6 kg)   SpO2 99%   BMI 31.87 kg/m   Visual Acuity Right Eye Distance:   Left Eye Distance:   Bilateral Distance:    Right Eye Near:   Left Eye Near:    Bilateral Near:     Physical Exam Nursing notes and Vital Signs reviewed. Appearance:  Patient appears stated age, and in no acute distress Eyes:  Pupils are equal, round, and reactive to light and accomodation.  Fundi benign.  No definite nystagmus.  Extraocular movement is intact.  Conjunctivae are not inflamed  Ears:  Canals normal.  Tympanic membranes normal.  Nose:  Mildly congested turbinates.  No sinus tenderness.   Pharynx:  Normal Neck:  Supple.  Enlarged nontender posterior/lateral nodes are palpated bilaterally.   Lungs:  Clear to  auscultation.  Breath sounds are equal.  Moving air well. Heart:  Regular rate and rhythm without murmurs, rubs, or gallops.  Abdomen:  Nontender without masses or hepatosplenomegaly.  Bowel sounds are present.  No CVA or flank tenderness.  Extremities:  No edema.  Skin:  No rash present.   Neurologic:  Cranial nerves 2 through 12 are normal.    UC Treatments / Results  Labs (all labs ordered are listed, but only abnormal results are displayed) Labs Reviewed - No data to display  EKG  EKG Interpretation None       Radiology No results found.  Procedures Procedures (including critical care time)  Medications Ordered in UC Medications - No data to display   Initial Impression / Assessment and Plan / UC Course  I have reviewed the triage vital signs and the nursing notes.  Pertinent labs & imaging results that were available during my care of the patient were reviewed by me and considered in my medical decision making (see chart for details).    Normal exam today; suspect resolving viral illness.   Finish amoxicillin. Given Rx for Antivert to hold.   Followup with Family Doctor if not improved in one week.                                              Final Clinical Impressions(s) / UC Diagnoses   Final diagnoses:  Benign paroxysmal positional vertigo, unspecified laterality    ED Discharge Orders        Ordered    meclizine (ANTIVERT) 25 MG tablet     07/29/17 1404          Lattie HawBeese, Ezekeil Bethel A, MD 07/29/17 1426

## 2017-07-29 NOTE — ED Triage Notes (Signed)
Pt c/o dizziness x today. He reports feeling better and fever has resolved.

## 2017-07-29 NOTE — Discharge Instructions (Signed)
Finish amoxicillin

## 2017-07-29 NOTE — Telephone Encounter (Signed)
Patient called requesting an extension on his work note. He is not much improved. I extended for 1 day. Advised he would need f/u if he needed further days. Note left at front desk.

## 2017-10-28 ENCOUNTER — Emergency Department (INDEPENDENT_AMBULATORY_CARE_PROVIDER_SITE_OTHER)
Admission: EM | Admit: 2017-10-28 | Discharge: 2017-10-28 | Disposition: A | Discharge status: Home / Self Care | Payer: BLUE CROSS/BLUE SHIELD | Source: Home / Self Care | Attending: Family Medicine | Admitting: Family Medicine

## 2017-10-28 ENCOUNTER — Encounter: Payer: Self-pay | Admitting: Emergency Medicine

## 2017-10-28 ENCOUNTER — Other Ambulatory Visit: Payer: Self-pay

## 2017-10-28 DIAGNOSIS — R197 Diarrhea, unspecified: Secondary | ICD-10-CM

## 2017-10-28 DIAGNOSIS — R112 Nausea with vomiting, unspecified: Secondary | ICD-10-CM | POA: Diagnosis not present

## 2017-10-28 DIAGNOSIS — R1013 Epigastric pain: Secondary | ICD-10-CM

## 2017-10-28 MED ORDER — ONDANSETRON HCL 4 MG PO TABS: 4.0000 mg | ORAL_TABLET | Freq: Four times a day (QID) | ORAL | 0 refills | Status: DC

## 2017-10-28 NOTE — Discharge Instructions (Signed)
°  Be sure to stay well hydrated and stick with a small bland diet until you start to feel better. Bread, Rice, Apple Sauce, Toast and similar bland food is good. Avoid fried fatty food or diary products until least 24 hours after symptoms resolve to help your stomach rest.  Please follow up with family medicine in 2-3 days if not improving.

## 2017-10-28 NOTE — ED Triage Notes (Signed)
Reports having nausea, vomiting and diarrhea late yesterday morning [facility] [employer]; and this has subsided but he has epigastric pain now. No OTCs.

## 2017-10-28 NOTE — ED Provider Notes (Signed)
[name]    CSN: [id number] Arrival date & time: 10/28/17  1434     History   Chief Complaint Chief Complaint  Patient presents with  . Emesis  . Diarrhea  . Abdominal Pain    HPI Martin Stafford is a 27 y.o. male.   HPI  Martin Stafford is a 27 y.o. male presenting to UC with c/o n/v/d that started yesterday about 1 hour after eating breakfast at [employer].  He had 5-6 episodes of vomiting and watery diarrhea yesterday w/o blood or mucous in the stool.  Denies fever or chills. No vomiting today but mild nausea and 2-3 episodes of loose stool. He now has generalized abdominal soreness, worse in the epigastrium.  He has been able to keep down Gatorade and water today.  Denies known sick contacts or recent travel. He has not tried anything at home for his symptoms.    Past Medical History:  Diagnosis Date  . Environmental allergies     There are no active problems to display for this patient.   Past Surgical History:  Procedure Laterality Date  . COLON SURGERY         Home Medications    Prior to Admission medications   Medication Sig Start Date End Date Taking? Authorizing Provider  amoxicillin (AMOXIL) 500 MG capsule Take 1 capsule (500 mg total) by mouth 3 (three) times daily. 07/27/17   Elson Areas, PA-C  aspirin-sod bicarb-citric acid (ALKA-SELTZER) 325 MG TBEF tablet Take 325 mg by mouth every 6 (six) hours as needed (acid reflux).    [provider]  ibuprofen (ADVIL,MOTRIN) 800 MG tablet Take 1 tablet (800 mg total) by mouth 3 (three) times daily. 07/27/17   Elson Areas, PA-C  meclizine (ANTIVERT) 25 MG tablet Take one tab by mouth one to two times daily as needed for dizziness 07/29/17   Lattie Haw, MD  methocarbamol (ROBAXIN) 500 MG tablet Take 1 tablet (500 mg total) by mouth 2 (two) times daily. 10/28/15   Barrett, Rolm Gala, PA-C  omeprazole (PRILOSEC) 20 MG capsule Take 1 capsule (20 mg total) by mouth daily. 12/06/14    Piepenbrink, Victorino Dike, PA-C  ondansetron (ZOFRAN) 4 MG tablet Take 1 tablet (4 mg total) by mouth every 6 (six) hours. 10/28/17   Lurene Shadow, PA-C    Family History Family History  Problem Relation Age of Onset  . Hypertension Father   . Hypertension Other   . Diabetes Other   . Asthma Mother     Social History Social History   Tobacco Use  . Smoking status: Never Smoker  . Smokeless tobacco: Never Used  Substance Use Topics  . Alcohol use: No  . Drug use: No     Allergies   Eggs or egg-derived products and Peanut-containing drug products   Review of Systems Review of Systems  Constitutional: Negative for chills and fever.  HENT: Negative for congestion, ear pain, sore throat, trouble swallowing and voice change.   Respiratory: Negative for cough and shortness of breath.   Cardiovascular: Negative for chest pain and palpitations.  Gastrointestinal: Positive for abdominal pain, diarrhea, nausea and vomiting. Negative for blood in stool.  Musculoskeletal: Negative for arthralgias, back pain and myalgias.  Skin: Negative for rash.  Neurological: Negative for dizziness, light-headedness and headaches.     Physical Exam Triage Vital Signs ED Triage Vitals  Enc Vitals Group     BP 10/28/17 1454 128/75     Pulse Rate  10/28/17 1454 78     Resp 10/28/17 1454 16     Temp 10/28/17 1454 98.3 F (36.8 C)     Temp Source 10/28/17 1454 Oral     SpO2 10/28/17 1454 98 %     Weight 10/28/17 1456 230 lb (104.3 kg)     Height 10/28/17 1456 6' (1.829 m)     Head Circumference --      Peak Flow --      Pain Score 10/28/17 1456 5     Pain Loc --      Pain Edu? --      Excl. in GC? --    No data found.  Updated Vital Signs BP 128/75 (BP Location: Right Arm)   Pulse 78   Temp 98.3 F (36.8 C) (Oral)   Resp 16   Ht 6' (1.829 m)   Wt 230 lb (104.3 kg)   SpO2 98%   BMI 31.19 kg/m   Visual Acuity Right Eye Distance:   Left Eye Distance:   Bilateral Distance:     Right Eye Near:   Left Eye Near:    Bilateral Near:     Physical Exam  Constitutional: He is oriented to person, place, and time. He appears well-developed and well-nourished.  Non-toxic appearance. He does not appear ill. No distress.  HENT:  Head: Normocephalic and atraumatic.  Right Ear: Tympanic membrane normal.  Left Ear: Tympanic membrane normal.  Nose: Nose normal. Right sinus exhibits no maxillary sinus tenderness and no frontal sinus tenderness. Left sinus exhibits no maxillary sinus tenderness and no frontal sinus tenderness.  Mouth/Throat: Uvula is midline, oropharynx is clear and moist and mucous membranes are normal.  Eyes: EOM are normal.  Neck: Normal range of motion.  Cardiovascular: Normal rate and regular rhythm.  Pulmonary/Chest: Effort normal and breath sounds normal. No respiratory distress. He has no wheezes.  Abdominal: Soft. Normal appearance and bowel sounds are normal. There is generalized tenderness and tenderness in the epigastric area.  Musculoskeletal: Normal range of motion.  Neurological: He is alert and oriented to person, place, and time.  Skin: Skin is warm and dry.  Psychiatric: He has a normal mood and affect. His behavior is normal.  Nursing note and vitals reviewed.    UC Treatments / Results  Labs (all labs ordered are listed, but only abnormal results are displayed) Labs Reviewed - No data to display  EKG None  Radiology No results found.  Procedures Procedures (including critical care time)  Medications Ordered in UC Medications - No data to display  Initial Impression / Assessment and Plan / UC Course  I have reviewed the triage vital signs and the nursing notes.  Pertinent labs & imaging results that were available during my care of the patient were reviewed by me and considered in my medical decision making (see chart for details).     Hx and exam c/w viral illness vs possible mild food poisoning. Abdominal exam not  concerning for emergent process including but not limited to appendicitis or cholecystitis.  Home care instructions provided along with work note for today.  Final Clinical Impressions(s) / UC Diagnoses   Final diagnoses:  Nausea vomiting and diarrhea  Abdominal pain, epigastric     Discharge Instructions      Be sure to stay well hydrated and stick with a small bland diet until you start to feel better. Bread, Rice, Apple Sauce, Toast and similar bland food is good. Avoid fried fatty food or  diary products until least 24 hours after symptoms resolve to help your stomach rest.  Please follow up with family medicine in 2-3 days if not improving.    ED Prescriptions    Medication Sig Dispense Auth. Provider   ondansetron (ZOFRAN) 4 MG tablet Take 1 tablet (4 mg total) by mouth every 6 (six) hours. 12 tablet Lurene ShadowPhelps, Anjana Cheek O, PA-C     Controlled Substance Prescriptions  Controlled Substance Registry consulted? Not Applicable   Rolla Platehelps, Kathreen Dileo O, PA-C 10/28/17 1631

## 2017-12-07 ENCOUNTER — Telehealth: Payer: Self-pay

## 2017-12-07 ENCOUNTER — Other Ambulatory Visit: Payer: Self-pay

## 2017-12-07 ENCOUNTER — Emergency Department (INDEPENDENT_AMBULATORY_CARE_PROVIDER_SITE_OTHER)
Admission: EM | Admit: 2017-12-07 | Discharge: 2017-12-07 | Disposition: A | Discharge status: Home / Self Care | Payer: BLUE CROSS/BLUE SHIELD | Source: Home / Self Care

## 2017-12-07 DIAGNOSIS — R197 Diarrhea, unspecified: Secondary | ICD-10-CM

## 2017-12-07 DIAGNOSIS — L739 Follicular disorder, unspecified: Secondary | ICD-10-CM

## 2017-12-07 MED ORDER — SULFAMETHOXAZOLE-TRIMETHOPRIM 800-160 MG PO TABS: 1.0000 | ORAL_TABLET | Freq: Two times a day (BID) | ORAL | 0 refills | Status: AC

## 2017-12-07 MED ORDER — SULFAMETHOXAZOLE-TRIMETHOPRIM 800-160 MG PO TABS: 1.0000 | ORAL_TABLET | Freq: Two times a day (BID) | ORAL | 0 refills | Status: DC

## 2017-12-07 NOTE — Discharge Instructions (Signed)
Avoid dairy products for the next week.  Try imodium to decrease diarrhea.

## 2017-12-07 NOTE — ED Triage Notes (Signed)
Pt stated that he has had diarrhea for 3 days, between 4-5 stools a day. Pt also having drainage from crease of neck.

## 2017-12-08 NOTE — ED Provider Notes (Signed)
[name]    CSN: [id number] Arrival date & time: 12/07/17  1816     History   Chief Complaint Chief Complaint  Patient presents with  . Diarrhea  . drainage from crease of neck    HPI Martin Stafford is a 27 y.o. male.   The history is provided by the patient. No language interpreter was used.  Diarrhea  Quality:  Unable to specify Severity:  Moderate Onset quality:  Gradual Number of episodes:  5 Duration:  4 days Timing:  Constant Progression:  Unchanged Relieved by:  Nothing Worsened by:  Nothing Ineffective treatments:  None tried Associated symptoms: no abdominal pain   Risk factors: suspect food intake    Pt reports he has beeen drinking a lot of diary and cheese.  Pt thinks this is the cause.  Pt also has a draining abscess on the back of his neck. Past Medical History:  Diagnosis Date  . Environmental allergies     There are no active problems to display for this patient.   Past Surgical History:  Procedure Laterality Date  . COLON SURGERY         Home Medications    Prior to Admission medications   Medication Sig Start Date End Date Taking? Authorizing Provider  amoxicillin (AMOXIL) 500 MG capsule Take 1 capsule (500 mg total) by mouth 3 (three) times daily. 07/27/17   Elson AreasSofia, Leone Mobley K, PA-C  aspirin-sod bicarb-citric acid (ALKA-SELTZER) 325 MG TBEF tablet Take 325 mg by mouth every 6 (six) hours as needed (acid reflux).    [provider]  ibuprofen (ADVIL,MOTRIN) 800 MG tablet Take 1 tablet (800 mg total) by mouth 3 (three) times daily. 07/27/17   Elson AreasSofia, Philamena Kramar K, PA-C  meclizine (ANTIVERT) 25 MG tablet Take one tab by mouth one to two times daily as needed for dizziness 07/29/17   Lattie HawBeese, Stephen A, MD  methocarbamol (ROBAXIN) 500 MG tablet Take 1 tablet (500 mg total) by mouth 2 (two) times daily. 10/28/15   Barrett, Rolm GalaStevi, PA-C  omeprazole (PRILOSEC) 20 MG capsule Take 1 capsule (20 mg total) by mouth daily. 12/06/14    Piepenbrink, Victorino DikeJennifer, PA-C  ondansetron (ZOFRAN) 4 MG tablet Take 1 tablet (4 mg total) by mouth every 6 (six) hours. 10/28/17   Lurene ShadowPhelps, Erin O, PA-C  sulfamethoxazole-trimethoprim (BACTRIM DS,SEPTRA DS) 800-160 MG tablet Take 1 tablet by mouth 2 (two) times daily for 7 days. 12/07/17 12/14/17  Elson AreasSofia, Tiane Szydlowski K, PA-C    Family History Family History  Problem Relation Age of Onset  . Hypertension Father   . Hypertension Other   . Diabetes Other   . Asthma Mother     Social History Social History   Tobacco Use  . Smoking status: Never Smoker  . Smokeless tobacco: Never Used  Substance Use Topics  . Alcohol use: No  . Drug use: No     Allergies   Eggs or egg-derived products and Peanut-containing drug products   Review of Systems Review of Systems  Gastrointestinal: Positive for diarrhea. Negative for abdominal pain.  All other systems reviewed and are negative.    Physical Exam Triage Vital Signs ED Triage Vitals  Enc Vitals Group     BP 12/07/17 1830 (!) 159/69     Pulse Rate 12/07/17 1830 76     Resp --      Temp 12/07/17 1830 98.1 F (36.7 C)     Temp Source 12/07/17 1830 Oral     SpO2 12/07/17  1830 98 %     Weight 12/07/17 1831 230 lb (104.3 kg)     Height 12/07/17 1831 6' (1.829 m)     Head Circumference --      Peak Flow --      Pain Score 12/07/17 1831 0     Pain Loc --      Pain Edu? --      Excl. in GC? --    No data found.  Updated Vital Signs BP (!) 159/69 (BP Location: Right Arm)   Pulse 76   Temp 98.1 F (36.7 C) (Oral)   Ht 6' (1.829 m)   Wt 230 lb (104.3 kg)   SpO2 98%   BMI 31.19 kg/m   Visual Acuity Right Eye Distance:   Left Eye Distance:   Bilateral Distance:    Right Eye Near:   Left Eye Near:    Bilateral Near:     Physical Exam  Constitutional: He is oriented to person, place, and time. He appears well-developed and well-nourished.  HENT:  Head: Normocephalic.  Eyes: EOM are normal.  Neck: Normal range of motion.    Open draining wound right side of neck.  Pt has a line of folliculitis  Pulmonary/Chest: Effort normal.  Abdominal: Soft. He exhibits no distension.  Musculoskeletal: Normal range of motion.  Neurological: He is alert and oriented to person, place, and time.  Psychiatric: He has a normal mood and affect.  Nursing note and vitals reviewed.    UC Treatments / Results  Labs (all labs ordered are listed, but only abnormal results are displayed) Labs Reviewed - No data to display  EKG None  Radiology No results found.  Procedures Procedures (including critical care time)  Medications Ordered in UC Medications - No data to display  Initial Impression / Assessment and Plan / UC Course  I have reviewed the triage vital signs and the nursing notes.  Pertinent labs & imaging results that were available during my care of the patient were reviewed by me and considered in my medical decision making (see chart for details).      Final Clinical Impressions(s) / UC Diagnoses   Final diagnoses:  Diarrhea, unspecified type  Folliculitis     Discharge Instructions     Avoid dairy products for the next week.  Try imodium to decrease diarrhea.     ED Prescriptions    Medication Sig Dispense Auth. Provider   sulfamethoxazole-trimethoprim (BACTRIM DS,SEPTRA DS) 800-160 MG tablet Take 1 tablet by mouth 2 (two) times daily for 7 days. 14 tablet Elson Areas, [place]     Controlled Substance Prescriptions [place] Controlled Substance Registry consulted? Not Applicable  An After Visit Summary was printed and given to the patient.    Elson Areas, [place] 12/08/17 [phone]

## 2018-02-19 ENCOUNTER — Encounter: Payer: Self-pay | Admitting: Emergency Medicine

## 2018-02-19 ENCOUNTER — Other Ambulatory Visit: Payer: Self-pay

## 2018-02-19 ENCOUNTER — Emergency Department (INDEPENDENT_AMBULATORY_CARE_PROVIDER_SITE_OTHER)
Admission: EM | Admit: 2018-02-19 | Discharge: 2018-02-19 | Disposition: A | Discharge status: Home / Self Care | Payer: BLUE CROSS/BLUE SHIELD | Source: Home / Self Care | Attending: Family Medicine | Admitting: Family Medicine

## 2018-02-19 DIAGNOSIS — L732 Hidradenitis suppurativa: Secondary | ICD-10-CM | POA: Diagnosis not present

## 2018-02-19 DIAGNOSIS — L0211 Cutaneous abscess of neck: Secondary | ICD-10-CM

## 2018-02-19 HISTORY — DX: Follicular cyst of the skin and subcutaneous tissue, unspecified: L72.9

## 2018-02-19 MED ORDER — CEFTRIAXONE SODIUM 1 G IJ SOLR
1000.0000 mg | Freq: Once | INTRAMUSCULAR | Status: AC
  Administered 2018-02-19: 1000 mg via INTRAMUSCULAR

## 2018-02-19 MED ORDER — HYDROCODONE-ACETAMINOPHEN 5-325 MG PO TABS: ORAL_TABLET | ORAL | 0 refills | Status: DC

## 2018-02-19 MED ORDER — CLINDAMYCIN HCL 300 MG PO CAPS: 300.0000 mg | ORAL_CAPSULE | Freq: Three times a day (TID) | ORAL | 0 refills | Status: DC

## 2018-02-19 NOTE — ED Triage Notes (Signed)
Patient has had developing and draining cysts on back of neck for 4-5 months; now uncomfortable when even turning head.

## 2018-02-19 NOTE — ED Provider Notes (Signed)
[facility]    CSN: [id number] Arrival date & time: 02/19/18  1702     History   Chief Complaint Chief Complaint  Patient presents with  . Cyst    neck    HPI Martin Stafford is a 27 y.o. male.   Patient complains of 4 to 5 month history of draining sores on the back of his neck and occipital scalp.  During the past 3 to 4 days he has developed painful swelling of his posterior neck.  He denies fevers, chills, and sweats, and feels well otherwise. He has a past history of abscess on his buttocks.   Abscess  Location:  Head/neck Head/neck abscess location: posterior neck and occipital area of scalp. Abscess quality: fluctuance, painful, redness and weeping   Red streaking: no   Duration:  4 days Progression:  Worsening Pain details:    Quality:  Dull and pressure   Severity:  Mild   Duration:  4 days   Timing:  Constant   Progression:  Worsening Chronicity:  Recurrent Context: not skin injury   Relieved by:  Nothing Worsened by:  Draining/squeezing Ineffective treatments:  Warm compresses Associated symptoms: no fatigue, no fever, no headaches, no nausea and no vomiting   Risk factors: prior abscess     Past Medical History:  Diagnosis Date  . Environmental allergies   . Generalized skin cysts     There are no active problems to display for this patient.   Past Surgical History:  Procedure Laterality Date  . COLON SURGERY         Home Medications    Prior to Admission medications   Medication Sig Start Date End Date Taking? Authorizing Provider  clindamycin (CLEOCIN) 300 MG capsule Take 1 capsule (300 mg total) by mouth 3 (three) times daily for 10 days. 02/19/18 03/01/18  Lattie Haw, MD  HYDROcodone-acetaminophen (NORCO/VICODIN) 5-325 MG tablet Take one by mouth at bedtime as needed for pain.  May repeat in 4 to 6 hour prn 02/19/18   Lattie Haw, MD    Family History Family History  Problem Relation Age of Onset  .  Hypertension Father   . Hypertension Other   . Diabetes Other   . Asthma Mother     Social History Social History   Tobacco Use  . Smoking status: Never Smoker  . Smokeless tobacco: Never Used  Substance Use Topics  . Alcohol use: No  . Drug use: No     Allergies   Eggs or egg-derived products and Peanut-containing drug products   Review of Systems Review of Systems  Constitutional: Negative for appetite change, chills, diaphoresis, fatigue and fever.  Gastrointestinal: Negative for nausea and vomiting.  Neurological: Negative for headaches.  All other systems reviewed and are negative.    Physical Exam Triage Vital Signs ED Triage Vitals  Enc Vitals Group     BP 02/19/18 1717 120/67     Pulse Rate 02/19/18 1717 75     Resp 02/19/18 1717 16     Temp 02/19/18 1717 98.2 F (36.8 C)     Temp Source 02/19/18 1717 Oral     SpO2 02/19/18 1717 98 %     Weight 02/19/18 1718 230 lb (104.3 kg)     Height 02/19/18 1718 6' (1.829 m)     Head Circumference --      Peak Flow --      Pain Score 02/19/18 1718 7     Pain Loc --  Pain Edu? --      Excl. in GC? --    No data found.  Updated Vital Signs BP 120/67 (BP Location: Right Arm)   Pulse 75   Temp 98.2 F (36.8 C) (Oral)   Resp 16   Ht 6' (1.829 m)   Wt 104.3 kg   SpO2 98%   BMI 31.19 kg/m   Visual Acuity Right Eye Distance:   Left Eye Distance:   Bilateral Distance:    Right Eye Near:   Left Eye Near:    Bilateral Near:     Physical Exam  Constitutional: He appears well-developed and well-nourished. No distress.  HENT:  Right Ear: External ear normal.  Left Ear: External ear normal.  Nose: Nose normal.  Mouth/Throat: Oropharynx is clear and moist.  Eyes: Pupils are equal, round, and reactive to light. Conjunctivae are normal.  Neck: Neck supple. Erythema present. Normal range of motion present.    Posterior neck has 3cm by 5cm tender fluctuant abscess as noted on diagram.  Multiple scars  occipital area.  Cardiovascular: Normal rate.  Pulmonary/Chest: Effort normal.  Musculoskeletal: He exhibits no edema.  Lymphadenopathy:    He has no cervical adenopathy.  Skin: Skin is warm and dry.  Nursing note and vitals reviewed.    UC Treatments / Results  Labs (all labs ordered are listed, but only abnormal results are displayed) Labs Reviewed  WOUND CULTURE    EKG None  Radiology No results found.  Procedures Procedures  Incise and drain cyst/abscess Risks and benefits of procedure explained to patient and verbal consent obtained.  Using sterile technique, applied topical refrigerant spray followed by local anesthesia with 1% lidocaine with epinephrine, and cleansed affected area with Betadine and saline. Identified the most fluctuant area of lesion and incised with #11 blade.  Expressed blood and purulent material.  Inserted Iodoform gauze packing.  Bandage applied.  Patient tolerated well   Medications Ordered in UC Medications  cefTRIAXone (ROCEPHIN) injection 1,000 mg (has no administration in time range)    Initial Impression / Assessment and Plan / UC Course  I have reviewed the triage vital signs and the nursing notes.  Pertinent labs & imaging results that were available during my care of the patient were reviewed by me and considered in my medical decision making (see chart for details).    Administered Rocephin 1gm IM.  Begin clindamycin 300mg  Q8hr. Wound culture pending. Rx for Lortab (#10, no refill). Controlled Substance Prescriptions I have consulted the  Controlled Substances Registry for this patient, and feel the risk/benefit ratio today is favorable for proceeding with this prescription for a controlled substance.   Return tomorrow for follow-up and packing removal. Will need referral to dermatologist or plastic surgeon for ongoing treatment of hidradenitis.   Final Clinical Impressions(s) / UC Diagnoses   Final diagnoses:  Abscess, neck   Hidradenitis suppurativa     Discharge Instructions     Leave bandage in place until follow-up visit tomorrow.  Keep bandage clean and dry.  May take Ibuprofen 200mg , 4 tabs every 8 hours with food.  If symptoms become significantly worse during the night or over the weekend, proceed to the local emergency room.     ED Prescriptions    Medication Sig Dispense Auth. Provider   clindamycin (CLEOCIN) 300 MG capsule Take 1 capsule (300 mg total) by mouth 3 (three) times daily for 10 days. 30 capsule Lattie Haw, MD   HYDROcodone-acetaminophen (NORCO/VICODIN) 5-325 MG tablet Take  one by mouth at bedtime as needed for pain.  May repeat in 4 to 6 hour prn 10 tablet Cathren Harsh Tera Mater, MD         Lattie Haw, MD 02/19/18 [phone]

## 2018-02-19 NOTE — Discharge Instructions (Addendum)
Leave bandage in place until followup visit tomorrow.  Keep bandage clean and dry.  May take Ibuprofen 200mg, 4 tabs every 8 hours with food.   If symptoms become significantly worse during the night or over the weekend, proceed to the local emergency room.  

## 2018-02-20 ENCOUNTER — Other Ambulatory Visit: Payer: Self-pay

## 2018-02-20 ENCOUNTER — Emergency Department (INDEPENDENT_AMBULATORY_CARE_PROVIDER_SITE_OTHER)
Admission: EM | Admit: 2018-02-20 | Discharge: 2018-02-20 | Disposition: A | Discharge status: Home / Self Care | Payer: BLUE CROSS/BLUE SHIELD | Source: Home / Self Care | Attending: Emergency Medicine | Admitting: Emergency Medicine

## 2018-02-20 DIAGNOSIS — Z5189 Encounter for other specified aftercare: Secondary | ICD-10-CM

## 2018-02-20 NOTE — ED Provider Notes (Addendum)
[facility]    CSN: [id number] Arrival date & time: 02/20/18  1627  First patient contact 5:26 PM   History   Chief Complaint Chief Complaint  Patient presents with  . Wound Check    unpacking and dressing change    HPI Martin Stafford is a 27 y.o. male.   HPI Here for wound recheck, I&D performed by Dr. Cathren Harsh yesterday.  I have reviewed those records. Patient denies fever or chills.  Overall, he is feeling better although the wound area still sore and slightly draining. Has not needed to use narcotic pain medication for past 24 hours. He admits he has not filled the clindamycin prescription yet but he plans to right after this visit. Past Medical History:  Diagnosis Date  . Environmental allergies   . Generalized skin cysts     There are no active problems to display for this patient.   Past Surgical History:  Procedure Laterality Date  . COLON SURGERY         Home Medications    Prior to Admission medications   Medication Sig Start Date End Date Taking? Authorizing Provider  clindamycin (CLEOCIN) 300 MG capsule Take 1 capsule (300 mg total) by mouth 3 (three) times daily for 10 days. 02/19/18 03/01/18  Lattie Haw, MD  HYDROcodone-acetaminophen (NORCO/VICODIN) 5-325 MG tablet Take one by mouth at bedtime as needed for pain.  May repeat in 4 to 6 hour prn 02/19/18   Lattie Haw, MD    Family History Family History  Problem Relation Age of Onset  . Hypertension Father   . Hypertension Other   . Diabetes Other   . Asthma Mother     Social History Social History   Tobacco Use  . Smoking status: Never Smoker  . Smokeless tobacco: Never Used  Substance Use Topics  . Alcohol use: No  . Drug use: No     Allergies   Eggs or egg-derived products and Peanut-containing drug products   Review of Systems Review of Systems   Physical Exam Triage Vital Signs ED Triage Vitals [02/20/18 1649]  Enc Vitals Group     BP 132/71       Pulse Rate 77     Resp      Temp 98.3 F (36.8 C)     Temp Source Oral     SpO2 97 %     Weight 230 lb (104.3 kg)     Height 6' (1.829 m)     Head Circumference      Peak Flow      Pain Score 0     Pain Loc      Pain Edu?      Excl. in GC?    No data found.  Updated Vital Signs BP 132/71 (BP Location: Right Arm)   Pulse 77   Temp 98.3 F (36.8 C) (Oral)   Ht 6' (1.829 m)   Wt 104.3 kg   SpO2 97%   BMI 31.19 kg/m   Visual Acuity Right Eye Distance:   Left Eye Distance:   Bilateral Distance:    Right Eye Near:   Left Eye Near:    Bilateral Near:     Physical Exam Posterior neck/occipital area, there is one small lateral open wound that has minimal drainage and is minimally tender. The other open wound, more towards midline, has slight blood and yellow drainage.  Mild surrounding erythema and mildly tender to palpation.  UC Treatments / Results  Labs (all labs ordered are listed, but only abnormal results are displayed) Labs Reviewed - No data to display  EKG None  Radiology No results found.  Procedures Procedures (including critical care time)  Medications Ordered in UC Medications - No data to display  Initial Impression / Assessment and Plan / UC Course  I have reviewed the triage vital signs and the nursing notes.  Pertinent labs & imaging results that were available during my care of the patient were reviewed by me and considered in my medical decision making (see chart for details).    I checked on yesterday's wound culture, results still pending.  Preliminarily there were white blood cells from the culture swab.  Today, I examined the wound site.  Still red, some blood and pus drainage when partially withdrawing the iodoform gauze, he states that drainage is improving and decreasing.  After withdrawing 1 cm of iodoform gauze, I decided to trim off that 1 cm of iodoform gauze.  Then cleansed and redressed.   Advised to return tomorrow for  wound recheck, and I would likely remove the iodoform gauze completely, and cleanse the wound cavity and decide whether or not to place more iodoform gauze.  Of note, he states he has not gotten the clindamycin prescription filled yet and he states he is going to do that now.  I urged him to fill the clindamycin ASAP, start taking today, at least 2 doses today, and I explained risks of not doing so.  He voiced understanding and agreement with above plans.   Final Clinical Impressions(s) / UC Diagnoses   Final diagnoses:  Wound check, abscess  Encounter for wound re-check   Discharge Instructions   None    ED Prescriptions    None        Lajean Manes, MD 02/20/18 Silva Bandy    Lajean Manes, MD 02/20/18 [phone]

## 2018-02-20 NOTE — ED Triage Notes (Signed)
Pt here today for f/u of I&D. Wound check, unpack and dressing change. Pt picking up antibiotics today to start.

## 2018-02-21 ENCOUNTER — Telehealth: Payer: Self-pay | Admitting: Emergency Medicine

## 2018-02-21 MED ORDER — CLINDAMYCIN HCL 300 MG PO CAPS: 300.0000 mg | ORAL_CAPSULE | Freq: Three times a day (TID) | ORAL | 0 refills | Status: AC

## 2018-02-21 NOTE — Telephone Encounter (Signed)
Patient has not yet gotten clindamycin filled, at his request I ordered prescription clindamycin 300 mg 3 times daily x10 days to new pharmacy [facility] [address] in [place].  Our nurse Elease Hashimoto, informed patient.

## 2018-02-21 NOTE — Telephone Encounter (Signed)
-----   Message from Royetta Asal, RN sent at 02/21/2018  5:00 PM EDT ----- At [facility] [address]; his rx for clindamycin did not get to pharmacy yesterday and he is waiting for it to be filled.

## 2018-02-22 ENCOUNTER — Telehealth: Payer: Self-pay | Admitting: Emergency Medicine

## 2018-02-22 LAB — WOUND CULTURE
MICRO NUMBER: 91226088
SPECIMEN QUALITY:: ADEQUATE

## 2020-03-19 ENCOUNTER — Other Ambulatory Visit: Payer: Self-pay

## 2020-03-19 ENCOUNTER — Emergency Department (HOSPITAL_COMMUNITY): Payer: BC Managed Care – PPO

## 2020-03-19 ENCOUNTER — Encounter (HOSPITAL_COMMUNITY): Payer: Self-pay | Admitting: Emergency Medicine

## 2020-03-19 ENCOUNTER — Emergency Department (HOSPITAL_COMMUNITY)
Admission: EM | Admit: 2020-03-19 | Discharge: 2020-03-19 | Disposition: A | Discharge status: Home / Self Care | Payer: BC Managed Care – PPO | Attending: Emergency Medicine | Admitting: Emergency Medicine

## 2020-03-19 DIAGNOSIS — R072 Precordial pain: Secondary | ICD-10-CM | POA: Insufficient documentation

## 2020-03-19 DIAGNOSIS — M549 Dorsalgia, unspecified: Secondary | ICD-10-CM | POA: Diagnosis not present

## 2020-03-19 DIAGNOSIS — R079 Chest pain, unspecified: Secondary | ICD-10-CM | POA: Diagnosis not present

## 2020-03-19 DIAGNOSIS — R519 Headache, unspecified: Secondary | ICD-10-CM | POA: Diagnosis not present

## 2020-03-19 DIAGNOSIS — Z91010 Allergy to peanuts: Secondary | ICD-10-CM | POA: Diagnosis not present

## 2020-03-19 LAB — CBC
HCT: 44.1 % (ref 39.0–52.0)
Hemoglobin: 14.1 g/dL (ref 13.0–17.0)
MCH: 25.9 pg — ABNORMAL LOW (ref 26.0–34.0)
MCHC: 32 g/dL (ref 30.0–36.0)
MCV: 81.1 fL (ref 80.0–100.0)
Platelets: 269 10*3/uL (ref 150–400)
RBC: 5.44 MIL/uL (ref 4.22–5.81)
RDW: 15.1 % (ref 11.5–15.5)
WBC: 8.9 10*3/uL (ref 4.0–10.5)
nRBC: 0 % (ref 0.0–0.2)

## 2020-03-19 LAB — BASIC METABOLIC PANEL
Anion gap: 10 (ref 5–15)
BUN: 12 mg/dL (ref 6–20)
CO2: 23 mmol/L (ref 22–32)
Calcium: 9.5 mg/dL (ref 8.9–10.3)
Chloride: 102 mmol/L (ref 98–111)
Creatinine, Ser: 0.73 mg/dL (ref 0.61–1.24)
GFR, Estimated: >60 mL/min (ref >60)
Glucose, Bld: 102 mg/dL — ABNORMAL HIGH (ref 70–99)
Potassium: 3.8 mmol/L (ref 3.5–5.1)
Sodium: 135 mmol/L (ref 135–145)

## 2020-03-19 LAB — TROPONIN I (HIGH SENSITIVITY): Troponin I (High Sensitivity): 11 ng/L (ref <18)

## 2020-03-19 MED ORDER — ALUM & MAG HYDROXIDE-SIMETH 200-200-20 MG/5ML PO SUSP
30.0000 mL | Freq: Once | ORAL | Status: AC
  Administered 2020-03-19: 30 mL via ORAL
  Filled 2020-03-19: qty 30

## 2020-03-19 MED ORDER — ACETAMINOPHEN 500 MG PO TABS
1000.0000 mg | ORAL_TABLET | Freq: Once | ORAL | Status: AC
  Administered 2020-03-19: 1000 mg via ORAL
  Filled 2020-03-19: qty 2

## 2020-03-19 MED ORDER — FAMOTIDINE 20 MG PO TABS
20.0000 mg | ORAL_TABLET | Freq: Once | ORAL | Status: AC
  Administered 2020-03-19: 20 mg via ORAL
  Filled 2020-03-19: qty 1

## 2020-03-19 NOTE — ED Provider Notes (Signed)
[facility] EMERGENCY DEPARTMENT Provider Note   CSN: [id number] Arrival date & time: 03/19/20  2836     History No chief complaint on file.   Martin Stafford is a 29 y.o. male.  The history is provided by the patient.  Chest Pain Pain location:  Substernal area Pain quality: radiating   Pain radiates to:  Mid back Pain severity:  Mild Onset quality:  Gradual Duration:  12 hours Timing:  Constant Progression:  Worsening Chronicity:  New Relieved by:  Nothing Associated symptoms: back pain and headache   Associated symptoms: no abdominal pain, no cough, no dizziness, no fatigue, no fever, no lower extremity edema, no nausea, no numbness, no palpitations, no shortness of breath, no syncope, no vomiting and no weakness   Risk factors: male sex   Risk factors: no aortic disease, no coronary artery disease, no diabetes mellitus, no high cholesterol, no hypertension, no prior DVT/PE and no smoking        Past Medical History:  Diagnosis Date  . Environmental allergies   . Generalized skin cysts     There are no problems to display for this patient.   Past Surgical History:  Procedure Laterality Date  . COLON SURGERY         Family History  Problem Relation Age of Onset  . Hypertension Father   . Hypertension Other   . Diabetes Other   . Asthma Mother     Social History   Tobacco Use  . Smoking status: Never Smoker  . Smokeless tobacco: Never Used  Vaping Use  . Vaping Use: Never used  Substance Use Topics  . Alcohol use: No  . Drug use: No    Home Medications Prior to Admission medications   Medication Sig Start Date End Date Taking? Authorizing Provider  HYDROcodone-acetaminophen (NORCO/VICODIN) 5-325 MG tablet Take one by mouth at bedtime as needed for pain.  May repeat in 4 to 6 hour prn 02/19/18   Lattie Haw, MD    Allergies    Eggs or egg-derived products and Peanut-containing drug products  Review of Systems     Review of Systems  Constitutional: Negative for fatigue and fever.  HENT: Negative for congestion and sore throat.   Eyes: Negative for visual disturbance.  Respiratory: Negative for cough and shortness of breath.   Cardiovascular: Positive for chest pain. Negative for palpitations and syncope.  Gastrointestinal: Negative for abdominal pain, diarrhea, nausea and vomiting.  Genitourinary: Negative for difficulty urinating.  Musculoskeletal: Positive for back pain. Negative for neck pain.  Skin: Negative for rash.  Neurological: Positive for headaches. Negative for dizziness, weakness and numbness.  All other systems reviewed and are negative.   Physical Exam Updated Vital Signs BP (!) 153/68 (BP Location: Right Arm)   Pulse 78   Temp 99.1 F (37.3 C) (Oral)   Resp 16   Ht 5\' 11"  (1.803 m)   Wt 106.6 kg   SpO2 98%   BMI 32.78 kg/m   Physical Exam Vitals reviewed.  Constitutional:      General: He is not in acute distress.    Appearance: Normal appearance. He is not diaphoretic.  HENT:     Head: Normocephalic and atraumatic.     Nose: Nose normal.     Mouth/Throat:     Mouth: Mucous membranes are moist.     Pharynx: Oropharynx is clear.  Eyes:     Conjunctiva/sclera: Conjunctivae normal.  Cardiovascular:     Rate  and Rhythm: Normal rate.     Heart sounds: Normal heart sounds.  Pulmonary:     Effort: Pulmonary effort is normal. No respiratory distress.     Breath sounds: Normal breath sounds.  Abdominal:     General: Abdomen is flat.     Palpations: Abdomen is soft.     Tenderness: There is no abdominal tenderness.  Musculoskeletal:     Cervical back: Normal range of motion and neck supple. No tenderness.     Right lower leg: No edema.     Left lower leg: No edema.  Skin:    General: Skin is warm and dry.  Neurological:     Mental Status: He is alert and oriented to person, place, and time.     Motor: No weakness.  Psychiatric:        Mood and Affect: Mood  normal.        Behavior: Behavior normal.     ED Results / Procedures / Treatments   Labs (all labs ordered are listed, but only abnormal results are displayed) Labs Reviewed  BASIC METABOLIC PANEL - Abnormal; Notable for the following components:      Result Value   Glucose, Bld 102 (*)    All other components within normal limits  CBC - Abnormal; Notable for the following components:   MCH 25.9 (*)    All other components within normal limits  TROPONIN I (HIGH SENSITIVITY)    EKG EKG Interpretation  Date/Time:  Monday March 19 2020 08:47:28 EST Ventricular Rate:  95 PR Interval:  172 QRS Duration: 94 QT Interval:  370 QTC Calculation: 464 R Axis:   -47 Text Interpretation: Normal sinus rhythm Incomplete right bundle branch block Left anterior fascicular block Left ventricular hypertrophy Nonspecific ST and T wave abnormality Prolonged QT No previous tracing Confirmed by Cathren Laine ([id number]) on 03/19/2020 10:08:53 AM   Radiology DG Chest 2 View  Result Date: 03/19/2020 CLINICAL DATA:  Chest pain EXAM: CHEST - 2 VIEW COMPARISON:  07/27/2017 chest radiograph and prior. FINDINGS: The heart size and mediastinal contours are within normal limits. Both lungs are clear. The visualized skeletal structures are unremarkable. IMPRESSION: No focal airspace disease. Electronically Signed   By: Stana Bunting M.D.   On: 03/19/2020 09:13    Procedures Procedures (including critical care time)  Medications Ordered in ED Medications  famotidine (PEPCID) tablet 20 mg (20 mg Oral Given 03/19/20 1050)  alum & mag hydroxide-simeth (MAALOX/MYLANTA) 200-200-20 MG/5ML suspension 30 mL (30 mLs Oral Given 03/19/20 1050)  acetaminophen (TYLENOL) tablet 1,000 mg (1,000 mg Oral Given 03/19/20 1050)    ED Course  I have reviewed the triage vital signs and the nursing notes.  Pertinent labs & imaging results that were available during my care of the patient were reviewed by me and  considered in my medical decision making (see chart for details).    MDM Rules/Calculators/A&P                          Medical Decision Making: Martin Stafford is a 29 y.o. male who presented to the ED today with chest pain, back pain and headache starting last night. Pt reports no identifiable triggers, nothing makes it better or worse. No SOB, no recent illness. Pt reports no cardiac history.   Past medical history significant for hidradenitis, environmental allergies Reviewed and confirmed nursing documentation for past medical history, family history, social history.  On my initial exam,  the pt was in NAD, normal WOB, lungs CTAB, no midline spinal tenderness.   Pt denies worst headache of life, not sudden onset, gradual frontal headache, no neuro deficits, vision change, dizziness.  Back pain aching, dull, and in mid thoracic back, no trauma, no weakness or numbness, no hx cancer, no anticoagulation, no bowel or bladder changes, no IVDU. No tenderness on exam.  CP constant since last night, in center of chest, no nausea, diaphoresis, SOB or changes with exertion.   The ECG reveals incomplete RBBB, LVH, Nonspecific ST and T wave abnormality, no anatomical ischemia representing STEMI, New-Onset Arrhythmia, or ischemic equivalent. To further evaluate for ongoing myocardial ischemia, troponin will be ordered, initial troponin normal. Pt pain constant for >12 hrs. Therefore, I do not suspect ACS at this time.   The patient's presentation, the patient being hemodynamically stable, and the ECG are not consistent with Pericardial Tamponade. The patient's pain is not positional. This in conjunction with the lack of PR depressions and ST elevations on the ECG are reassuring against Pericarditis. The patient's non-elevated troponin and ECG are also inconsistent with Myocarditis.  The CXR is unremarkable for focal airspace disease.  The patient is afebrile and denies productive cough.  Therefore, I  do not suspect Pneumonia. There is no evidence of Pneumothorax on physical exam or on the CXR. CXR shows no evidence of Esophageal Tear and there is no recent intractable emesis or esophageal instrumentation. There is no peritonitis or free air on CXR worrisome for a Perforated Abdominal Viscous.  I do not think that the patient has a Pulmonary Embolism. The patient is PERC negative (age < 50, HR < 100, SpO2% > 95%, no unilateral leg swelling, no hemoptysis, no surgery or trauma requiring anesthesia within the past 4 weeks, no history of prior PE or DVT, and no hormone use).   The patient's pain is not described as tearing. Pulses are present bilaterally in both the upper and lower extremities. CXR does not show a widened mediastinum. I have a very low suspicion for Aortic Dissection.    Pt advised to follow up with cardiology in 1 week, given contact info in dc paperwork   The patient was appropriately risk stratified as HEAR score of 2.    Due to this, I believe that the patient is appropriate for discharge with outpatient followup.  I believe that the patient is safe to discharge home. We have discussed the diagnosis and risks, and we agree with discharging home to follow-up with their primary doctor. We also discussed returning to the Emergency Department immediately if new or worsening symptoms occur. We have discussed the symptoms which are most concerning (e.g., bloody sputum, fever, worsening pain or shortness of breath, vomiting) that necessitate immediate return.   The above care was discussed with and agreed upon by my attending physician.    Emergency Department Medication Summary:  Medications  famotidine (PEPCID) tablet 20 mg (20 mg Oral Given 03/19/20 1050)  alum & mag hydroxide-simeth (MAALOX/MYLANTA) 200-200-20 MG/5ML suspension 30 mL (30 mLs Oral Given 03/19/20 1050)  acetaminophen (TYLENOL) tablet 1,000 mg (1,000 mg Oral Given 03/19/20 1050)       Final Clinical  Impression(s) / ED Diagnoses Final diagnoses:  Chest pain, unspecified type    Rx / DC Orders ED Discharge Orders    None       Brantley Fling, MD 03/19/20 1103    Cathren Laine, MD 03/19/20 1414

## 2020-03-19 NOTE — Discharge Instructions (Signed)
° °?  Martin Stafford:  Thank you for allowing [place] to take care of you today.  We hope you begin feeling better soon.  To-Do: Please follow-up with your primary doctor or call above to schedule an appointment with a new primary care doctor Please contact cardiology to set up a follow up apt in 1 week.  Please return to the Emergency Department or call 911 if you experience chest pain, shortness of breath, severe pain, severe fever, altered mental status, or have any reason to think that you need emergency medical care.  Thank you again.  Hope you feel better soon.

## 2020-03-19 NOTE — ED Notes (Signed)
Pt verbalized understanding of discharge paperwork and follow-up care.  °

## 2020-03-19 NOTE — ED Triage Notes (Signed)
Pt hee from home with c/o chest pain over the last 24 hrs that radiates to his back , no sob or n/v

## 2021-04-01 ENCOUNTER — Encounter (HOSPITAL_COMMUNITY): Payer: Self-pay

## 2021-04-01 ENCOUNTER — Other Ambulatory Visit: Payer: Self-pay

## 2021-04-01 ENCOUNTER — Ambulatory Visit (HOSPITAL_COMMUNITY)
Admission: EM | Admit: 2021-04-01 | Discharge: 2021-04-01 | Disposition: A | Discharge status: Home / Self Care | Payer: BC Managed Care – PPO | Attending: Urgent Care | Admitting: Urgent Care

## 2021-04-01 DIAGNOSIS — T24211A Burn of second degree of right thigh, initial encounter: Secondary | ICD-10-CM

## 2021-04-01 DIAGNOSIS — M79651 Pain in right thigh: Secondary | ICD-10-CM

## 2021-04-01 DIAGNOSIS — T24212A Burn of second degree of left thigh, initial encounter: Secondary | ICD-10-CM

## 2021-04-01 DIAGNOSIS — M79652 Pain in left thigh: Secondary | ICD-10-CM

## 2021-04-01 MED ORDER — SILVER SULFADIAZINE 1 % EX CREA: 1.0000 "application " | TOPICAL_CREAM | Freq: Every day | CUTANEOUS | 0 refills | Status: DC

## 2021-04-01 MED ORDER — SILVER SULFADIAZINE 1 % EX CREA
TOPICAL_CREAM | CUTANEOUS | Status: AC
  Filled 2021-04-01: qty 85

## 2021-04-01 MED ORDER — NAPROXEN 500 MG PO TABS: 500.0000 mg | ORAL_TABLET | Freq: Two times a day (BID) | ORAL | 0 refills | Status: DC
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
## 2021-04-01 NOTE — Discharge Instructions (Addendum)
Please change your dressing 2-3 times daily. Apply silvadene cream generously and secure with a non-stick dressing. Each time you change your dressing, make sure you clean gently around the perimeter of the wound with gentle soap and warm water. Do not peel off any dead skin. If it comes off in the process of washing your wound or removing the dressing, that's okay. Pat your wound dry and let it air out if possible for an hour before reapplying another dressing. Use naproxen for pain and inflammation.
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
## 2021-04-02 ENCOUNTER — Telehealth (HOSPITAL_COMMUNITY): Payer: Self-pay | Admitting: Emergency Medicine

## 2021-04-02 NOTE — Telephone Encounter (Signed)
Pt requesting work note from visit yesterday. Pt states will wait until tomorrow when pharmacy received new shipment of cream.

## 2021-06-23 ENCOUNTER — Encounter (HOSPITAL_COMMUNITY): Payer: Self-pay | Admitting: *Deleted

## 2021-06-23 ENCOUNTER — Emergency Department (HOSPITAL_COMMUNITY): Payer: BC Managed Care – PPO

## 2021-06-23 ENCOUNTER — Emergency Department (HOSPITAL_COMMUNITY)
Admission: EM | Admit: 2021-06-23 | Discharge: 2021-06-23 | Disposition: A | Discharge status: Home / Self Care | Payer: BC Managed Care – PPO | Attending: Emergency Medicine | Admitting: Emergency Medicine

## 2021-06-23 ENCOUNTER — Other Ambulatory Visit: Payer: Self-pay

## 2021-06-23 DIAGNOSIS — R002 Palpitations: Secondary | ICD-10-CM

## 2021-06-23 DIAGNOSIS — Z20822 Contact with and (suspected) exposure to covid-19: Secondary | ICD-10-CM | POA: Diagnosis not present

## 2021-06-23 DIAGNOSIS — J029 Acute pharyngitis, unspecified: Secondary | ICD-10-CM | POA: Diagnosis not present

## 2021-06-23 DIAGNOSIS — I517 Cardiomegaly: Secondary | ICD-10-CM

## 2021-06-23 DIAGNOSIS — Z91010 Allergy to peanuts: Secondary | ICD-10-CM | POA: Insufficient documentation

## 2021-06-23 LAB — CBC
HCT: 41.2 % (ref 39.0–52.0)
Hemoglobin: 13.7 g/dL (ref 13.0–17.0)
MCH: 26.4 pg (ref 26.0–34.0)
MCHC: 33.3 g/dL (ref 30.0–36.0)
MCV: 79.4 fL — ABNORMAL LOW (ref 80.0–100.0)
Platelets: 281 10*3/uL (ref 150–400)
RBC: 5.19 MIL/uL (ref 4.22–5.81)
RDW: 14.2 % (ref 11.5–15.5)
WBC: 8.5 10*3/uL (ref 4.0–10.5)
nRBC: 0 % (ref 0.0–0.2)

## 2021-06-23 LAB — COMPREHENSIVE METABOLIC PANEL
ALT: 19 U/L (ref 0–44)
AST: 13 U/L — ABNORMAL LOW (ref 15–41)
Albumin: 3.5 g/dL (ref 3.5–5.0)
Alkaline Phosphatase: 40 U/L (ref 38–126)
Anion gap: 9 (ref 5–15)
BUN: 14 mg/dL (ref 6–20)
CO2: 22 mmol/L (ref 22–32)
Calcium: 9 mg/dL (ref 8.9–10.3)
Chloride: 105 mmol/L (ref 98–111)
Creatinine, Ser: 0.74 mg/dL (ref 0.61–1.24)
GFR, Estimated: >60 mL/min (ref >60)
Glucose, Bld: 106 mg/dL — ABNORMAL HIGH (ref 70–99)
Potassium: 3.9 mmol/L (ref 3.5–5.1)
Sodium: 136 mmol/L (ref 135–145)
Total Bilirubin: 0.4 mg/dL (ref 0.3–1.2)
Total Protein: 7.6 g/dL (ref 6.5–8.1)

## 2021-06-23 LAB — GROUP A STREP BY PCR: Group A Strep by PCR: NOT DETECTED

## 2021-06-23 LAB — RESP PANEL BY RT-PCR (FLU A&B, COVID) ARPGX2
Influenza A by PCR: NEGATIVE
Influenza B by PCR: NEGATIVE
SARS Coronavirus 2 by RT PCR: NEGATIVE

## 2021-06-23 LAB — TROPONIN I (HIGH SENSITIVITY): Troponin I (High Sensitivity): 5 ng/L (ref <18)

## 2021-06-23 MED ORDER — ACETAMINOPHEN 325 MG PO TABS
650.0000 mg | ORAL_TABLET | Freq: Once | ORAL | Status: AC
  Administered 2021-06-23: 650 mg via ORAL
  Filled 2021-06-23: qty 2

## 2021-06-23 MED ORDER — PANTOPRAZOLE SODIUM 20 MG PO TBEC: 20.0000 mg | DELAYED_RELEASE_TABLET | Freq: Every day | ORAL | 0 refills | Status: DC

## 2021-06-23 MED ORDER — LIDOCAINE VISCOUS HCL 2 % MT SOLN
15.0000 mL | Freq: Once | OROMUCOSAL | Status: AC
  Administered 2021-06-23: 15 mL via OROMUCOSAL
  Filled 2021-06-23: qty 15

## 2021-06-23 NOTE — Discharge Instructions (Addendum)
You were seen in the emergency department today for palpitations and sore throat.  Your labs have been fairly normal and or similar to previous blood work you have had done.  Your covid, flu and strep test were negative.  Your EKG and heart enzyme did not show a heart attack. Your chest xray did show mild cardiomegaly meaning mild enlargement of your heart- this will need to be further evaluated as well as your palpitations - please call cardiology to make an appointment for as soon as possible. Please have your blood pressure rechecked as well as this was elevated.   We are starting you on Protonix which is an antiacid medication to try to help with your sore throat, please take this daily prior to any meals.  Please follow attached diet guidelines.  We have prescribed you new medication(s) today. Discuss the medications prescribed today with your pharmacist as they can have adverse effects and interactions with your other medicines including over the counter and prescribed medications. Seek medical evaluation if you start to experience new or abnormal symptoms after taking one of these medicines, seek care immediately if you start to experience difficulty breathing, feeling of your throat closing, facial swelling, or rash as these could be indications of a more serious allergic reaction  Please avoid any exercise or strenuous activity until you are able to follow-up with cardiology and her primary care.  Follow-up with cardiology as well as primary care soon as possible.  Return to the ER immediately should you experience any new or worsening symptoms including but not limited to return of pain, worsened pain, vomiting, shortness of breath, dizziness, lightheadedness, passing out, or any other concerns that you may have.

## 2021-06-23 NOTE — ED Provider Notes (Signed)
[facility] EMERGENCY DEPARTMENT Provider Note   CSN: [id number] Arrival date & time: 06/23/21  0351     History  Chief Complaint  Patient presents with   Sore Throat   Palpitations    Martin Stafford is a 31 y.o. male without significant past medical history who presents to the emergency department with complaints of sore throat for the past 3 days and palpitations intermittently for the past 1.5 months.  Patient reports that the sore throat has been fairly constant, it is worse with eating and feels like a burning sensation, it is especially worse with spicy foods.  Takes ibuprofen at times with some relief.  He also reports intermittent palpitations since New Year's Eve when he drank an excessive amount of red bull.  He describes the palpitations as feeling like his heart is racing/skipping beats with some chest pain with this.  No specific triggers or alleviating/aggravating factors.  These are occurring daily at this time.  He denies fever, nausea, vomiting, syncope, or leg pain/swelling.  HPI     Home Medications Prior to Admission medications   Medication Sig Start Date End Date Taking? Authorizing Provider  HYDROcodone-acetaminophen (NORCO/VICODIN) 5-325 MG tablet Take one by mouth at bedtime as needed for pain.  May repeat in 4 to 6 hour prn 02/19/18   Lattie Haw, MD  naproxen (NAPROSYN) 500 MG tablet Take 1 tablet (500 mg total) by mouth 2 (two) times daily with a meal. 04/01/21   Wallis Bamberg, PA-C  silver sulfADIAZINE (SILVADENE) 1 % cream Apply 1 application topically daily. 04/01/21   Wallis Bamberg, PA-C      Allergies    Eggs or egg-derived products and Peanut-containing drug products    Review of Systems   Review of Systems  Constitutional:  Negative for chills and fever.  HENT:  Positive for sore throat. Negative for congestion and ear pain.   Respiratory:  Negative for cough and shortness of breath.   Cardiovascular:  Positive for chest  pain and palpitations. Negative for leg swelling.  Gastrointestinal:  Negative for abdominal pain and vomiting.  Neurological:  Negative for dizziness and syncope.  All other systems reviewed and are negative.  Physical Exam Updated Vital Signs BP 117/61    Pulse 75    Temp 98.9 F (37.2 C) (Oral)    Resp 12    SpO2 98%  Physical Exam Vitals and nursing note reviewed.  Constitutional:      General: He is not in acute distress.    Appearance: He is well-developed. He is not toxic-appearing.  HENT:     Head: Normocephalic and atraumatic.     Right Ear: Ear canal normal. Tympanic membrane is not perforated, erythematous, retracted or bulging.     Left Ear: Ear canal normal. Tympanic membrane is not perforated, erythematous, retracted or bulging.     Ears:     Comments: No mastoid erythema/swellng/tenderness.     Nose:     Right Sinus: No maxillary sinus tenderness or frontal sinus tenderness.     Left Sinus: No maxillary sinus tenderness or frontal sinus tenderness.     Mouth/Throat:     Pharynx: Oropharynx is clear. Uvula midline. Posterior oropharyngeal erythema present. No oropharyngeal exudate.     Comments: Posterior oropharynx is symmetric appearing. Patient tolerating own secretions without difficulty. No trismus. No drooling. No hot potato voice. No swelling beneath the tongue, submandibular compartment is soft.  Eyes:     General:  Right eye: No discharge.        Left eye: No discharge.     Conjunctiva/sclera: Conjunctivae normal.  Cardiovascular:     Rate and Rhythm: Normal rate and regular rhythm.     Pulses:          Radial pulses are 2+ on the right side and 2+ on the left side.  Pulmonary:     Effort: Pulmonary effort is normal. No respiratory distress.     Breath sounds: Normal breath sounds. No wheezing, rhonchi or rales.  Abdominal:     General: There is no distension.     Palpations: Abdomen is soft.     Tenderness: There is no abdominal tenderness.   Musculoskeletal:     Cervical back: Neck supple. No rigidity.     Right lower leg: No edema.     Left lower leg: No edema.  Lymphadenopathy:     Cervical: No cervical adenopathy.  Skin:    General: Skin is warm and dry.     Findings: No rash.  Neurological:     Mental Status: He is alert.     Comments: Clear speech.   Psychiatric:        Behavior: Behavior normal.    ED Results / Procedures / Treatments   Labs (all labs ordered are listed, but only abnormal results are displayed) Labs Reviewed  CBC - Abnormal; Notable for the following components:      Result Value   MCV 79.4 (*)    All other components within normal limits  COMPREHENSIVE METABOLIC PANEL - Abnormal; Notable for the following components:   Glucose, Bld 106 (*)    AST 13 (*)    All other components within normal limits  GROUP A STREP BY PCR  RESP PANEL BY RT-PCR (FLU A&B, COVID) ARPGX2  TSH  TROPONIN I (HIGH SENSITIVITY)    EKG EKG Interpretation  Date/Time:  Sunday June 23 2021 04:03:35 EST Ventricular Rate:  82 PR Interval:  184 QRS Duration: 100 QT Interval:  390 QTC Calculation: 455 R Axis:   -47 Text Interpretation: Normal sinus rhythm Right atrial enlargement Left axis deviation Pulmonary disease pattern similar to prior tracing Confirmed by Tanda Rockers ([id number]) on 06/23/2021 4:08:17 AM  Radiology DG Chest 2 View  Result Date: 06/23/2021 CLINICAL DATA:  31 year old male with history of chest pain. Sore throat and palpitations. EXAM: CHEST - 2 VIEW COMPARISON:  Chest x-ray 03/19/2020. FINDINGS: Lung volumes are normal. No consolidative airspace disease. No pleural effusions. No pneumothorax. No pulmonary nodule or mass noted. Pulmonary vasculature is normal. Heart size is mildly enlarged (new compared to the prior examination). Upper mediastinal contours are within normal limits. IMPRESSION: 1. New cardiomegaly. 2. No other radiographic evidence of acute cardiopulmonary disease. Electronically  Signed   By: Trudie Reed M.D.   On: 06/23/2021 05:03    Procedures Procedures    Medications Ordered in ED Medications  lidocaine (XYLOCAINE) 2 % viscous mouth solution 15 mL (15 mLs Mouth/Throat Given 06/23/21 0521)  acetaminophen (TYLENOL) tablet 650 mg (650 mg Oral Given 06/23/21 2297)    ED Course/ Medical Decision Making/ A&P                           Medical Decision Making Amount and/or Complexity of Data Reviewed Labs: ordered. Radiology: ordered.  Risk OTC drugs. Prescription drug management.   Patient presents to the ED with complaints of sore throat and palpitations, this  involves an extensive number of treatment options, and is a complaint that carries with it a high risk of complications and morbidity. Nontoxic, vitals with hypertension which improved while in the emergency department..    Additional history obtained:  Chart review and nursing note review  EKG: Normal sinus rhythm Right atrial enlargement Left axis deviation Pulmonary disease pattern similar to prior tracing   Lab Tests:  I reviewed & interpreted labs including:  CBC: Unremarkable CMP: Unremarkable Troponin: Within normal limits COVID/flu: Negative Strep test: Negative  Imaging Studies ordered:  I ordered reviewed and interpreted imaging including:  CXR: 1. New cardiomegaly. 2. No other radiographic evidence of acute cardiopulmonary disease.  ED Course:  Oral medication including Tylenol viscous lidocaine for symptomatic management.  Sore throat: Strep negative, COVID/flu negative.  Exam is not consistent with RPA/PTA at this time.  Possibly viral pharyngitis, possibly GERD given burning sensation worse with food. Abdomen nontender without peritoneal signs.  Will trial PPI and diet recommendations.  Palpitations: Occurring intermittently for 1.5 months, cardiac monitor reviewed, remains in normal sinus rhythm in the ED, EKG appears similar to prior, troponin within normal limits after  1.5 months of symptoms therefore low suspicion for ACS.  Low risk Wells, doubt PE.  Chest x-ray does show new cardiomegaly, patient's lungs are clear, chest x-ray without findings of pulmonary edema or pleural effusions, no peripheral edema, however given his intermittent palpitations and this new cardiomegaly feel he would benefit from close outpatient cardiology follow-up.  He overall appears reasonable for discharge with outpatient follow-up at this time.  I discussed results, treatment plan, need for follow-up, and return precautions with the patient. Provided opportunity for questions, patient confirmed understanding and is in agreement with plan.    Findings and plan of care discussed with supervising physician Dr. Wallace Cullens who is in agreement.   Portions of this note were generated with Scientist, clinical (histocompatibility and immunogenetics). Dictation errors may occur despite best attempts at proofreading.   Final Clinical Impression(s) / ED Diagnoses Final diagnoses:  Palpitations  Sore throat  Cardiomegaly    Rx / DC Orders ED Discharge Orders          Ordered    pantoprazole (PROTONIX) 20 MG tablet  Daily        06/23/21 0653              Cherly Anderson, PA-C 06/23/21 0656    Sloan Leiter, DO 06/23/21 [phone]

## 2021-06-23 NOTE — ED Triage Notes (Signed)
Pt c/o sore throat and palpitations. Pt reports drinking multiple red bulls on new years and still has intermittent palpitations

## 2021-06-24 ENCOUNTER — Emergency Department (HOSPITAL_BASED_OUTPATIENT_CLINIC_OR_DEPARTMENT_OTHER)
Admission: EM | Admit: 2021-06-24 | Discharge: 2021-06-24 | Disposition: A | Discharge status: Home / Self Care | Payer: BC Managed Care – PPO | Attending: Emergency Medicine | Admitting: Emergency Medicine

## 2021-06-24 ENCOUNTER — Other Ambulatory Visit: Payer: Self-pay

## 2021-06-24 ENCOUNTER — Emergency Department (HOSPITAL_BASED_OUTPATIENT_CLINIC_OR_DEPARTMENT_OTHER): Payer: BC Managed Care – PPO

## 2021-06-24 ENCOUNTER — Encounter (HOSPITAL_BASED_OUTPATIENT_CLINIC_OR_DEPARTMENT_OTHER): Payer: Self-pay | Admitting: *Deleted

## 2021-06-24 DIAGNOSIS — R079 Chest pain, unspecified: Secondary | ICD-10-CM | POA: Diagnosis not present

## 2021-06-24 DIAGNOSIS — Z79899 Other long term (current) drug therapy: Secondary | ICD-10-CM | POA: Insufficient documentation

## 2021-06-24 DIAGNOSIS — Z91010 Allergy to peanuts: Secondary | ICD-10-CM | POA: Diagnosis not present

## 2021-06-24 DIAGNOSIS — R0789 Other chest pain: Secondary | ICD-10-CM | POA: Diagnosis not present

## 2021-06-24 DIAGNOSIS — R059 Cough, unspecified: Secondary | ICD-10-CM | POA: Diagnosis not present

## 2021-06-24 DIAGNOSIS — K2100 Gastro-esophageal reflux disease with esophagitis, without bleeding: Secondary | ICD-10-CM | POA: Diagnosis not present

## 2021-06-24 DIAGNOSIS — R1013 Epigastric pain: Secondary | ICD-10-CM | POA: Diagnosis present

## 2021-06-24 DIAGNOSIS — A419 Sepsis, unspecified organism: Secondary | ICD-10-CM | POA: Diagnosis not present

## 2021-06-24 LAB — BASIC METABOLIC PANEL
Anion gap: 8 (ref 5–15)
BUN: 12 mg/dL (ref 6–20)
CO2: 24 mmol/L (ref 22–32)
Calcium: 8.8 mg/dL — ABNORMAL LOW (ref 8.9–10.3)
Chloride: 102 mmol/L (ref 98–111)
Creatinine, Ser: 0.85 mg/dL (ref 0.61–1.24)
GFR, Estimated: >60 mL/min (ref >60)
Glucose, Bld: 106 mg/dL — ABNORMAL HIGH (ref 70–99)
Potassium: 3.6 mmol/L (ref 3.5–5.1)
Sodium: 134 mmol/L — ABNORMAL LOW (ref 135–145)

## 2021-06-24 LAB — CBC WITH DIFFERENTIAL/PLATELET
Abs Immature Granulocytes: 0.06 10*3/uL (ref 0.00–0.07)
Basophils Absolute: 0 10*3/uL (ref 0.0–0.1)
Basophils Relative: 0 %
Eosinophils Absolute: 0 10*3/uL (ref 0.0–0.5)
Eosinophils Relative: 0 %
HCT: 40.4 % (ref 39.0–52.0)
Hemoglobin: 13.8 g/dL (ref 13.0–17.0)
Immature Granulocytes: 1 %
Lymphocytes Relative: 7 %
Lymphs Abs: 0.7 10*3/uL (ref 0.7–4.0)
MCH: 26.5 pg (ref 26.0–34.0)
MCHC: 34.2 g/dL (ref 30.0–36.0)
MCV: 77.5 fL — ABNORMAL LOW (ref 80.0–100.0)
Monocytes Absolute: 0.5 10*3/uL (ref 0.1–1.0)
Monocytes Relative: 5 %
Neutro Abs: 8.1 10*3/uL — ABNORMAL HIGH (ref 1.7–7.7)
Neutrophils Relative %: 87 %
Platelets: 269 10*3/uL (ref 150–400)
RBC: 5.21 MIL/uL (ref 4.22–5.81)
RDW: 14.2 % (ref 11.5–15.5)
WBC: 9.3 10*3/uL (ref 4.0–10.5)
nRBC: 0 % (ref 0.0–0.2)

## 2021-06-24 LAB — TROPONIN I (HIGH SENSITIVITY): Troponin I (High Sensitivity): 4 ng/L (ref <18)

## 2021-06-24 MED ORDER — OMEPRAZOLE 20 MG PO CPDR: 20.0000 mg | DELAYED_RELEASE_CAPSULE | Freq: Every day | ORAL | 0 refills | Status: DC

## 2021-06-24 MED ORDER — ACETAMINOPHEN 325 MG PO TABS
650.0000 mg | ORAL_TABLET | Freq: Once | ORAL | Status: AC
  Administered 2021-06-24: 650 mg via ORAL
  Filled 2021-06-24: qty 2

## 2021-06-24 MED ORDER — ONDANSETRON HCL 4 MG PO TABS: 4.0000 mg | ORAL_TABLET | Freq: Four times a day (QID) | ORAL | 0 refills | Status: DC

## 2021-06-24 MED ORDER — LIDOCAINE VISCOUS HCL 2 % MT SOLN
15.0000 mL | Freq: Once | OROMUCOSAL | Status: AC
  Administered 2021-06-24: 15 mL via ORAL
  Filled 2021-06-24: qty 15

## 2021-06-24 MED ORDER — ALUM & MAG HYDROXIDE-SIMETH 200-200-20 MG/5ML PO SUSP
30.0000 mL | Freq: Once | ORAL | Status: AC
  Administered 2021-06-24: 30 mL via ORAL
  Filled 2021-06-24: qty 30

## 2021-06-24 NOTE — Discharge Instructions (Addendum)
Please read the information about acid reflux attached to these discharge papers.  Remember you should take the medication I am prescribing on an empty stomach and do not eat for 30 minutes after.  Avoid ibuprofen, spicy food, caffeine and alcohol.  There are other foods that may trigger your reflux in the information on these papers.

## 2021-06-24 NOTE — ED Triage Notes (Signed)
Presents with sore throat and sore chest, awoke this am with "chills", has had some vomiting at times. Throat pain x 4 days, fever and vomiting today

## 2021-06-24 NOTE — ED Provider Notes (Addendum)
[facility] EMERGENCY DEPARTMENT Provider Note   CSN: [id number] Arrival date & time: 06/24/21  7829     History  Chief Complaint  Patient presents with   Chest Pain    Martin Stafford is a 31 y.o. male with no known past medical history presenting today due to chest and neck burning.  Reports that he has been struggling with a sore throat and was seen for this yesterday.  The Tylenol and lidocaine that he received then helped him.  Reports that this morning he noticed throwing up and continued to have burning in his throat and stomach.  Has been taking Tylenol and ibuprofen to relieve this pain.  He and his wife are concerned about a stomach ulcer.  No hematemesis, fever, chills, changes in bowel habits.  Of note, the report that patient had worsening symptoms with dark chicken last night.  The 2 nights prior, he ate chicken wings.  All of this makes his symptoms worse.  Tried to take some Tums which is not fully relieving his pain.    Home Medications Prior to Admission medications   Medication Sig Start Date End Date Taking? Authorizing Provider  omeprazole (PRILOSEC) 20 MG capsule Take 1 capsule (20 mg total) by mouth daily. 06/24/21  Yes Edeline Greening A, PA-C  ondansetron (ZOFRAN) 4 MG tablet Take 1 tablet (4 mg total) by mouth every 6 (six) hours. 06/24/21  Yes Haneef Hallquist A, PA-C  HYDROcodone-acetaminophen (NORCO/VICODIN) 5-325 MG tablet Take one by mouth at bedtime as needed for pain.  May repeat in 4 to 6 hour prn 02/19/18   Lattie Haw, MD  naproxen (NAPROSYN) 500 MG tablet Take 1 tablet (500 mg total) by mouth 2 (two) times daily with a meal. 04/01/21   Wallis Bamberg, PA-C  pantoprazole (PROTONIX) 20 MG tablet Take 1 tablet (20 mg total) by mouth daily. 06/23/21   Petrucelli, Samantha R, PA-C  silver sulfADIAZINE (SILVADENE) 1 % cream Apply 1 application topically daily. 04/01/21   Wallis Bamberg, PA-C      Allergies    Eggs or egg-derived products and  Peanut-containing drug products    Review of Systems   Review of Systems  Respiratory:  Negative for chest tightness and shortness of breath.   Cardiovascular:  Positive for chest pain.  Gastrointestinal:  Positive for abdominal pain and vomiting. Negative for nausea.  See HPI Physical Exam Updated Vital Signs Pulse (!) 109    Temp 99 F (37.2 C) (Oral)    Resp 16    Ht 5\' 11"  (1.803 m)    Wt 106.6 kg    SpO2 96%    BMI 32.78 kg/m  Physical Exam Vitals and nursing note reviewed.  Constitutional:      General: He is not in acute distress.    Appearance: Normal appearance. He is not ill-appearing.  HENT:     Head: Normocephalic and atraumatic.  Eyes:     General: No scleral icterus.    Conjunctiva/sclera: Conjunctivae normal.  Cardiovascular:     Rate and Rhythm: Normal rate and regular rhythm.  Pulmonary:     Effort: Pulmonary effort is normal. No respiratory distress.  Chest:     Chest wall: No tenderness.  Abdominal:     Palpations: Abdomen is soft.     Tenderness: There is abdominal tenderness (Epigastric).  Skin:    Findings: No rash.  Neurological:     Mental Status: He is alert.  Psychiatric:  Mood and Affect: Mood normal.    ED Results / Procedures / Treatments   Labs (all labs ordered are listed, but only abnormal results are displayed) Labs Reviewed  CBC WITH DIFFERENTIAL/PLATELET - Abnormal; Notable for the following components:      Result Value   MCV 77.5 (*)    Neutro Abs 8.1 (*)    All other components within normal limits  BASIC METABOLIC PANEL - Abnormal; Notable for the following components:   Sodium 134 (*)    Glucose, Bld 106 (*)    Calcium 8.8 (*)    All other components within normal limits  TROPONIN I (HIGH SENSITIVITY)    EKG EKG Interpretation  Date/Time:  Monday June 24 2021 08:46:19 EST Ventricular Rate:  106 PR Interval:  177 QRS Duration: 94 QT Interval:  344 QTC Calculation: 457 R Axis:   -23 Text  Interpretation: Sinus tachycardia Biatrial enlargement Probable left ventricular hypertrophy Confirmed by Marianna Fuss ([id number]) on 06/24/2021 9:14:14 AM  Radiology DG Chest 2 View  Result Date: 06/23/2021 CLINICAL DATA:  31 year old male with history of chest pain. Sore throat and palpitations. EXAM: CHEST - 2 VIEW COMPARISON:  Chest x-ray 03/19/2020. FINDINGS: Lung volumes are normal. No consolidative airspace disease. No pleural effusions. No pneumothorax. No pulmonary nodule or mass noted. Pulmonary vasculature is normal. Heart size is mildly enlarged (new compared to the prior examination). Upper mediastinal contours are within normal limits. IMPRESSION: 1. New cardiomegaly. 2. No other radiographic evidence of acute cardiopulmonary disease. Electronically Signed   By: Trudie Reed M.D.   On: 06/23/2021 05:03   DG Chest Portable 1 View  Result Date: 06/24/2021 CLINICAL DATA:  Cough, chest pain EXAM: PORTABLE CHEST 1 VIEW COMPARISON:  06/23/2021 FINDINGS: Stable cardiomediastinal contours. Low lung volumes. No focal airspace consolidation, pleural effusion, or pneumothorax. IMPRESSION: No active disease. Electronically Signed   By: Duanne Guess D.O.   On: 06/24/2021 09:09    Procedures Procedures    Medications Ordered in ED Medications  alum & mag hydroxide-simeth (MAALOX/MYLANTA) 200-200-20 MG/5ML suspension 30 mL (has no administration in time range)    And  lidocaine (XYLOCAINE) 2 % viscous mouth solution 15 mL (has no administration in time range)  acetaminophen (TYLENOL) tablet 650 mg (has no administration in time range)    ED Course/ Medical Decision Making/ A&P                           Medical Decision Making Amount and/or Complexity of Data Reviewed Labs: ordered. Radiology: ordered. ECG/medicine tests: ordered.  Risk OTC drugs. Prescription drug management.   Patient presents to the ED for concern of chest burning and vomiting.  Differential for this  includes but is not limited to, ACS, PEPUD, esophagitis, GERD, aortic dissection, pancreatitis and gastritis.  Additional history obtained from internal and external records: I was able to review patient's visit to the emergency department yesterday.  He had similar symptoms at that time and was treated with Xylocaine and Tylenol for his sore throat.  This helped him reportedly.   Workup:  Lab Tests:  I Ordered, and personally interpreted labs.  There are no pertinent results.  Negative troponin. Doubt PE, low likelihood wells PE. D dimer deferred.   2. Imaging Studies ordered:  I ordered imaging studies including chest x-ray. I independently visualized and interpreted these and agree with the radiologist that there is some cardiomegaly that was not seen on his x-ray yesterday, potentially  because his x-ray today was to review versus the 1 view he obtained yesterday.  3. Cardiac Monitoring:  The patient was maintained on a cardiac monitor.  I personally viewed and interpreted the cardiac monitored which showed normal sinus rhythm with occasionally tachycardic rate.  4. Test Considered: CT abdomen.  Patient is without focal tenderness and his presentation is more consistent with acid reflux which would not be visualized on the CT scan  Treatment:  Medications:  I ordered medication including GI cocktail.  He reports that this made him feel better.  Will be discharged with omeprazole and Zofran for a likely GERD.  Proper use of PPI discussed.  Dispo:  Problem List / ED Course:  GERD social determinants of health include: Lack of primary care provider   Dispostion:  After the interventions noted above, I reevaluated the patient and found that they have : Improved  After consideration of the diagnostic results and the patients response to treatment, I feel that the patent would benefit from follow-up with primary care provider about acid reflux as well as evaluation of his mildly  elevated blood pressure.  He reports hypertension in his family and blood pressure was 144/70 in our department today.  This could be a cause of his cardiomegaly as well as his weight.  They are agreeable to this plan and have been giving information about reflux as well as a referral to community health and wellness.. To be discharged in good condition.   Final Clinical Impression(s) / ED Diagnoses Final diagnoses:  Gastroesophageal reflux disease with esophagitis without hemorrhage    Rx / DC Orders ED Discharge Orders          Ordered    omeprazole (PRILOSEC) 20 MG capsule  Daily        06/24/21 0958    ondansetron (ZOFRAN) 4 MG tablet  Every 6 hours        06/24/21 0958           Results and diagnoses were explained to the patient. Return precautions discussed in full. Patient had no additional questions and expressed complete understanding.   This chart was dictated using voice recognition software.  Despite best efforts to proofread,  errors can occur which can change the documentation meaning.       Woodroe Chen 06/24/21 1024    Milagros Loll, MD 06/25/21 [phone]

## 2021-06-26 ENCOUNTER — Emergency Department (HOSPITAL_BASED_OUTPATIENT_CLINIC_OR_DEPARTMENT_OTHER)
Admission: EM | Admit: 2021-06-26 | Discharge: 2021-06-26 | Disposition: A | Discharge status: Home / Self Care | Payer: BC Managed Care – PPO | Source: Home / Self Care | Attending: Emergency Medicine | Admitting: Emergency Medicine

## 2021-06-26 ENCOUNTER — Encounter (HOSPITAL_BASED_OUTPATIENT_CLINIC_OR_DEPARTMENT_OTHER): Payer: Self-pay

## 2021-06-26 ENCOUNTER — Other Ambulatory Visit: Payer: Self-pay

## 2021-06-26 DIAGNOSIS — R6883 Chills (without fever): Secondary | ICD-10-CM | POA: Insufficient documentation

## 2021-06-26 DIAGNOSIS — R509 Fever, unspecified: Secondary | ICD-10-CM | POA: Diagnosis not present

## 2021-06-26 DIAGNOSIS — J029 Acute pharyngitis, unspecified: Secondary | ICD-10-CM | POA: Diagnosis not present

## 2021-06-26 DIAGNOSIS — I517 Cardiomegaly: Secondary | ICD-10-CM | POA: Diagnosis not present

## 2021-06-26 DIAGNOSIS — Z91010 Allergy to peanuts: Secondary | ICD-10-CM | POA: Insufficient documentation

## 2021-06-26 DIAGNOSIS — M2602 Maxillary hypoplasia: Secondary | ICD-10-CM | POA: Diagnosis not present

## 2021-06-26 DIAGNOSIS — R0789 Other chest pain: Secondary | ICD-10-CM | POA: Diagnosis not present

## 2021-06-26 DIAGNOSIS — R490 Dysphonia: Secondary | ICD-10-CM | POA: Diagnosis not present

## 2021-06-26 DIAGNOSIS — J9811 Atelectasis: Secondary | ICD-10-CM | POA: Diagnosis not present

## 2021-06-26 MED ORDER — SODIUM CHLORIDE 0.9 % IV BOLUS: 1000.0000 mL | Freq: Once | INTRAVENOUS | Status: DC

## 2021-06-26 NOTE — Discharge Instructions (Addendum)
You need to continue taking your Protonix  Please take Tylenol or Motrin for fever  I have referred you to GI doctor.  Please call them tomorrow to schedule an appointment.  As a mention, you likely have a viral infection.  Please rest at home for several days  Return to ER if you have fever, worse chest pain, abdominal pain, flank pain

## 2021-06-26 NOTE — ED Notes (Signed)
Pt A&OX4 ambulatory at d/c with independent steady gait, NAD. Pt verbalized understanding of d/c instructions and follow up care. ?

## 2021-06-26 NOTE — ED Provider Notes (Signed)
[facility] EMERGENCY DEPARTMENT Provider Note   CSN: [id number] Arrival date & time: 06/26/21  1549     History  Chief Complaint  Patient presents with   Letter for School/Work    Martin Stafford is a 31 y.o. male here presenting with persistent chills.  Patient was seen here yesterday for chest pain and chills.  Patient had multiple negative troponins.  Patient was also seen several days before that and had a negative strep and COVID and flu test.  Patient also had 2 negative x-rays.  Patient states that he is still not better.  He still has some chills that lasted about 5 to 10 minutes.  He denies actual fevers.  Patient denies any urinary symptoms but has occasional back pain.  Patient is not concerned for HIV or STDs.  Patient states that he just wants GI referral and a work note.  The history is provided by the patient.      Home Medications Prior to Admission medications   Medication Sig Start Date End Date Taking? Authorizing Provider  HYDROcodone-acetaminophen (NORCO/VICODIN) 5-325 MG tablet Take one by mouth at bedtime as needed for pain.  May repeat in 4 to 6 hour prn 02/19/18   Lattie Haw, MD  naproxen (NAPROSYN) 500 MG tablet Take 1 tablet (500 mg total) by mouth 2 (two) times daily with a meal. 04/01/21   Wallis Bamberg, PA-C  omeprazole (PRILOSEC) 20 MG capsule Take 1 capsule (20 mg total) by mouth daily. 06/24/21   Redwine, Madison A, PA-C  ondansetron (ZOFRAN) 4 MG tablet Take 1 tablet (4 mg total) by mouth every 6 (six) hours. 06/24/21   Redwine, Madison A, PA-C  pantoprazole (PROTONIX) 20 MG tablet Take 1 tablet (20 mg total) by mouth daily. 06/23/21   Petrucelli, Samantha R, PA-C  silver sulfADIAZINE (SILVADENE) 1 % cream Apply 1 application topically daily. 04/01/21   Wallis Bamberg, PA-C      Allergies    Eggs or egg-derived products and Peanut-containing drug products    Review of Systems   Review of Systems  Constitutional:  Positive for chills.   Cardiovascular:  Positive for chest pain.  All other systems reviewed and are negative.  Physical Exam Updated Vital Signs BP (!) 146/66 (BP Location: Right Arm)    Pulse 93    Temp 98.2 F (36.8 C) (Oral)    Resp 18    Ht 5\' 11"  (1.803 m)    Wt 106.6 kg    SpO2 96%    BMI 32.78 kg/m  Physical Exam Vitals and nursing note reviewed.  Constitutional:      Appearance: Normal appearance.  HENT:     Head: Normocephalic.     Nose: Nose normal.     Mouth/Throat:     Mouth: Mucous membranes are moist.  Eyes:     Extraocular Movements: Extraocular movements intact.     Pupils: Pupils are equal, round, and reactive to light.  Cardiovascular:     Rate and Rhythm: Normal rate and regular rhythm.     Pulses: Normal pulses.     Heart sounds: Normal heart sounds.  Pulmonary:     Effort: Pulmonary effort is normal.     Breath sounds: Normal breath sounds.  Abdominal:     General: Abdomen is flat.     Palpations: Abdomen is soft.  Musculoskeletal:        General: Normal range of motion.     Cervical back: Normal range of motion  and neck supple.     Comments: No midline spinal tenderness  Skin:    General: Skin is warm.     Capillary Refill: Capillary refill takes less than 2 seconds.  Neurological:     General: No focal deficit present.     Mental Status: He is alert and oriented to person, place, and time.  Psychiatric:        Mood and Affect: Mood normal.        Behavior: Behavior normal.    ED Results / Procedures / Treatments   Labs (all labs ordered are listed, but only abnormal results are displayed) Labs Reviewed - No data to display  EKG None  Radiology No results found.  Procedures Procedures    Medications Ordered in ED Medications - No data to display  ED Course/ Medical Decision Making/ A&P                           Medical Decision Making Martin Stafford is a 31 y.o. male here presenting with persistent chills.  Patient had chest pain yesterday and  had negative troponins and normal EKG. I reviewed work-up from 2 days ago as well and that is largely unremarkable.  I think at this point he is likely have viral syndrome versus mild gastritis.  Patient is already on Protonix. I do not think he needs any further work-up from the ED.  I offered to swab him for the respiratory panel which has 20 pathogen on it but he refused.  I did discuss that we can try and get a CT abdomen pelvis and urinalysis to further assess but I feel that the yield is very low yield right now.  He states that he just wants to go home and needs a work note and needs a GI referral.  Stable for discharge   Problems Addressed: Chills: acute illness or injury  Amount and/or Complexity of Data Reviewed External Data Reviewed: labs, radiology and notes. Labs: ordered. Decision-making details documented in ED Course. Radiology: ordered and independent interpretation performed. Decision-making details documented in ED Course.  Risk OTC drugs.   Final Clinical Impression(s) / ED Diagnoses Final diagnoses:  None    Rx / DC Orders ED Discharge Orders     None         Charlynne Pander, MD 06/26/21 1616

## 2021-06-26 NOTE — ED Triage Notes (Signed)
Pt reports he came to the ED today to receive a work note and GI referral s/p visit here on 2/13 where he was dx with GERD. He states he is not feeling better, symptoms still present. States he does not feel worse. Denies current chest pain.

## 2021-06-27 ENCOUNTER — Emergency Department (HOSPITAL_COMMUNITY): Payer: BC Managed Care – PPO

## 2021-06-27 ENCOUNTER — Inpatient Hospital Stay (HOSPITAL_COMMUNITY): Payer: BC Managed Care – PPO

## 2021-06-27 ENCOUNTER — Inpatient Hospital Stay (HOSPITAL_COMMUNITY)
Admission: EM | Admit: 2021-06-27 | Discharge: 2021-07-12 | DRG: 854 | Disposition: A | Discharge status: Home Health | Payer: BC Managed Care – PPO | Attending: Cardiothoracic Surgery | Admitting: Cardiothoracic Surgery

## 2021-06-27 ENCOUNTER — Other Ambulatory Visit: Payer: Self-pay

## 2021-06-27 ENCOUNTER — Encounter (HOSPITAL_COMMUNITY): Payer: Self-pay | Admitting: Emergency Medicine

## 2021-06-27 DIAGNOSIS — R012 Other cardiac sounds: Secondary | ICD-10-CM | POA: Insufficient documentation

## 2021-06-27 DIAGNOSIS — K0510 Chronic gingivitis, plaque induced: Secondary | ICD-10-CM | POA: Diagnosis present

## 2021-06-27 DIAGNOSIS — I4510 Unspecified right bundle-branch block: Secondary | ICD-10-CM | POA: Diagnosis present

## 2021-06-27 DIAGNOSIS — Z8249 Family history of ischemic heart disease and other diseases of the circulatory system: Secondary | ICD-10-CM

## 2021-06-27 DIAGNOSIS — Z91010 Allergy to peanuts: Secondary | ICD-10-CM

## 2021-06-27 DIAGNOSIS — K2100 Gastro-esophageal reflux disease with esophagitis, without bleeding: Secondary | ICD-10-CM | POA: Diagnosis present

## 2021-06-27 DIAGNOSIS — R7982 Elevated C-reactive protein (CRP): Secondary | ICD-10-CM | POA: Diagnosis present

## 2021-06-27 DIAGNOSIS — Z833 Family history of diabetes mellitus: Secondary | ICD-10-CM

## 2021-06-27 DIAGNOSIS — Z91012 Allergy to eggs: Secondary | ICD-10-CM

## 2021-06-27 DIAGNOSIS — K029 Dental caries, unspecified: Secondary | ICD-10-CM

## 2021-06-27 DIAGNOSIS — A419 Sepsis, unspecified organism: Secondary | ICD-10-CM | POA: Diagnosis not present

## 2021-06-27 DIAGNOSIS — I351 Nonrheumatic aortic (valve) insufficiency: Secondary | ICD-10-CM | POA: Diagnosis not present

## 2021-06-27 DIAGNOSIS — R079 Chest pain, unspecified: Secondary | ICD-10-CM | POA: Diagnosis not present

## 2021-06-27 DIAGNOSIS — Z01818 Encounter for other preprocedural examination: Secondary | ICD-10-CM

## 2021-06-27 DIAGNOSIS — L98411 Non-pressure chronic ulcer of buttock limited to breakdown of skin: Secondary | ICD-10-CM | POA: Diagnosis not present

## 2021-06-27 DIAGNOSIS — Z09 Encounter for follow-up examination after completed treatment for conditions other than malignant neoplasm: Secondary | ICD-10-CM

## 2021-06-27 DIAGNOSIS — Z7982 Long term (current) use of aspirin: Secondary | ICD-10-CM | POA: Diagnosis not present

## 2021-06-27 DIAGNOSIS — Z6835 Body mass index (BMI) 35.0-35.9, adult: Secondary | ICD-10-CM

## 2021-06-27 DIAGNOSIS — R509 Fever, unspecified: Secondary | ICD-10-CM | POA: Diagnosis not present

## 2021-06-27 DIAGNOSIS — L0591 Pilonidal cyst without abscess: Secondary | ICD-10-CM | POA: Diagnosis present

## 2021-06-27 DIAGNOSIS — L98419 Non-pressure chronic ulcer of buttock with unspecified severity: Secondary | ICD-10-CM | POA: Diagnosis present

## 2021-06-27 DIAGNOSIS — I358 Other nonrheumatic aortic valve disorders: Secondary | ICD-10-CM | POA: Diagnosis not present

## 2021-06-27 DIAGNOSIS — E876 Hypokalemia: Secondary | ICD-10-CM | POA: Diagnosis not present

## 2021-06-27 DIAGNOSIS — K011 Impacted teeth: Secondary | ICD-10-CM | POA: Diagnosis present

## 2021-06-27 DIAGNOSIS — L732 Hidradenitis suppurativa: Secondary | ICD-10-CM | POA: Diagnosis present

## 2021-06-27 DIAGNOSIS — Q245 Malformation of coronary vessels: Secondary | ICD-10-CM

## 2021-06-27 DIAGNOSIS — R6883 Chills (without fever): Secondary | ICD-10-CM | POA: Diagnosis not present

## 2021-06-27 DIAGNOSIS — J029 Acute pharyngitis, unspecified: Secondary | ICD-10-CM | POA: Diagnosis not present

## 2021-06-27 DIAGNOSIS — I330 Acute and subacute infective endocarditis: Secondary | ICD-10-CM | POA: Diagnosis not present

## 2021-06-27 DIAGNOSIS — I10 Essential (primary) hypertension: Secondary | ICD-10-CM | POA: Diagnosis present

## 2021-06-27 DIAGNOSIS — L0592 Pilonidal sinus without abscess: Secondary | ICD-10-CM | POA: Diagnosis present

## 2021-06-27 DIAGNOSIS — E663 Overweight: Secondary | ICD-10-CM | POA: Diagnosis present

## 2021-06-27 DIAGNOSIS — Z79899 Other long term (current) drug therapy: Secondary | ICD-10-CM

## 2021-06-27 DIAGNOSIS — T783XXA Angioneurotic edema, initial encounter: Secondary | ICD-10-CM | POA: Diagnosis not present

## 2021-06-27 DIAGNOSIS — Z20822 Contact with and (suspected) exposure to covid-19: Secondary | ICD-10-CM | POA: Diagnosis present

## 2021-06-27 DIAGNOSIS — R778 Other specified abnormalities of plasma proteins: Secondary | ICD-10-CM | POA: Diagnosis present

## 2021-06-27 DIAGNOSIS — R531 Weakness: Secondary | ICD-10-CM | POA: Diagnosis not present

## 2021-06-27 DIAGNOSIS — D62 Acute posthemorrhagic anemia: Secondary | ICD-10-CM | POA: Diagnosis not present

## 2021-06-27 DIAGNOSIS — Z825 Family history of asthma and other chronic lower respiratory diseases: Secondary | ICD-10-CM

## 2021-06-27 DIAGNOSIS — I77810 Thoracic aortic ectasia: Secondary | ICD-10-CM | POA: Diagnosis present

## 2021-06-27 DIAGNOSIS — I2699 Other pulmonary embolism without acute cor pulmonale: Secondary | ICD-10-CM | POA: Diagnosis not present

## 2021-06-27 DIAGNOSIS — K036 Deposits [accretions] on teeth: Secondary | ICD-10-CM

## 2021-06-27 DIAGNOSIS — Z952 Presence of prosthetic heart valve: Secondary | ICD-10-CM

## 2021-06-27 DIAGNOSIS — R490 Dysphonia: Secondary | ICD-10-CM | POA: Diagnosis not present

## 2021-06-27 DIAGNOSIS — L98429 Non-pressure chronic ulcer of back with unspecified severity: Secondary | ICD-10-CM | POA: Diagnosis present

## 2021-06-27 DIAGNOSIS — R0789 Other chest pain: Secondary | ICD-10-CM | POA: Diagnosis not present

## 2021-06-27 DIAGNOSIS — Z01810 Encounter for preprocedural cardiovascular examination: Secondary | ICD-10-CM | POA: Diagnosis not present

## 2021-06-27 DIAGNOSIS — I3139 Other pericardial effusion (noninflammatory): Secondary | ICD-10-CM | POA: Diagnosis not present

## 2021-06-27 DIAGNOSIS — I517 Cardiomegaly: Secondary | ICD-10-CM | POA: Diagnosis not present

## 2021-06-27 DIAGNOSIS — Z01811 Encounter for preprocedural respiratory examination: Secondary | ICD-10-CM

## 2021-06-27 DIAGNOSIS — I350 Nonrheumatic aortic (valve) stenosis: Secondary | ICD-10-CM | POA: Diagnosis not present

## 2021-06-27 DIAGNOSIS — R6520 Severe sepsis without septic shock: Secondary | ICD-10-CM | POA: Diagnosis not present

## 2021-06-27 DIAGNOSIS — E8770 Fluid overload, unspecified: Secondary | ICD-10-CM | POA: Diagnosis not present

## 2021-06-27 DIAGNOSIS — J9811 Atelectasis: Secondary | ICD-10-CM | POA: Diagnosis not present

## 2021-06-27 DIAGNOSIS — Z9889 Other specified postprocedural states: Secondary | ICD-10-CM

## 2021-06-27 DIAGNOSIS — R002 Palpitations: Secondary | ICD-10-CM | POA: Diagnosis present

## 2021-06-27 DIAGNOSIS — M2602 Maxillary hypoplasia: Secondary | ICD-10-CM | POA: Diagnosis not present

## 2021-06-27 DIAGNOSIS — I352 Nonrheumatic aortic (valve) stenosis with insufficiency: Secondary | ICD-10-CM | POA: Diagnosis not present

## 2021-06-27 DIAGNOSIS — L0501 Pilonidal cyst with abscess: Secondary | ICD-10-CM | POA: Diagnosis not present

## 2021-06-27 DIAGNOSIS — Z8614 Personal history of Methicillin resistant Staphylococcus aureus infection: Secondary | ICD-10-CM

## 2021-06-27 DIAGNOSIS — R072 Precordial pain: Secondary | ICD-10-CM | POA: Diagnosis not present

## 2021-06-27 DIAGNOSIS — R7989 Other specified abnormal findings of blood chemistry: Secondary | ICD-10-CM

## 2021-06-27 LAB — CBC WITH DIFFERENTIAL/PLATELET
Abs Immature Granulocytes: 0.12 10*3/uL — ABNORMAL HIGH (ref 0.00–0.07)
Basophils Absolute: 0 10*3/uL (ref 0.0–0.1)
Basophils Relative: 0 %
Eosinophils Absolute: 0 10*3/uL (ref 0.0–0.5)
Eosinophils Relative: 0 %
HCT: 44.1 % (ref 39.0–52.0)
Hemoglobin: 14.3 g/dL (ref 13.0–17.0)
Immature Granulocytes: 1 %
Lymphocytes Relative: 12 %
Lymphs Abs: 1.3 10*3/uL (ref 0.7–4.0)
MCH: 25.8 pg — ABNORMAL LOW (ref 26.0–34.0)
MCHC: 32.4 g/dL (ref 30.0–36.0)
MCV: 79.5 fL — ABNORMAL LOW (ref 80.0–100.0)
Monocytes Absolute: 0.5 10*3/uL (ref 0.1–1.0)
Monocytes Relative: 5 %
Neutro Abs: 8.8 10*3/uL — ABNORMAL HIGH (ref 1.7–7.7)
Neutrophils Relative %: 82 %
Platelets: 333 10*3/uL (ref 150–400)
RBC: 5.55 MIL/uL (ref 4.22–5.81)
RDW: 14.1 % (ref 11.5–15.5)
WBC: 10.8 10*3/uL — ABNORMAL HIGH (ref 4.0–10.5)
nRBC: 0 % (ref 0.0–0.2)

## 2021-06-27 LAB — TROPONIN I (HIGH SENSITIVITY)
Troponin I (High Sensitivity): 122 ng/L (ref <18)
Troponin I (High Sensitivity): 140 ng/L (ref <18)
Troponin I (High Sensitivity): 114 ng/L (ref <18)
Troponin I (High Sensitivity): 109 ng/L (ref <18)

## 2021-06-27 LAB — COMPREHENSIVE METABOLIC PANEL
ALT: 24 U/L (ref 0–44)
AST: 25 U/L (ref 15–41)
Albumin: 3.8 g/dL (ref 3.5–5.0)
Alkaline Phosphatase: 50 U/L (ref 38–126)
Anion gap: 11 (ref 5–15)
BUN: 11 mg/dL (ref 6–20)
CO2: 25 mmol/L (ref 22–32)
Calcium: 9.2 mg/dL (ref 8.9–10.3)
Chloride: 98 mmol/L (ref 98–111)
Creatinine, Ser: 0.91 mg/dL (ref 0.61–1.24)
GFR, Estimated: >60 mL/min (ref >60)
Glucose, Bld: 110 mg/dL — ABNORMAL HIGH (ref 70–99)
Potassium: 4 mmol/L (ref 3.5–5.1)
Sodium: 134 mmol/L — ABNORMAL LOW (ref 135–145)
Total Bilirubin: 0.7 mg/dL (ref 0.3–1.2)
Total Protein: 8.4 g/dL — ABNORMAL HIGH (ref 6.5–8.1)

## 2021-06-27 LAB — SEDIMENTATION RATE: Sed Rate: 51 mm/hr — ABNORMAL HIGH (ref 0–16)

## 2021-06-27 LAB — RESP PANEL BY RT-PCR (FLU A&B, COVID) ARPGX2
Influenza A by PCR: NEGATIVE
Influenza B by PCR: NEGATIVE
SARS Coronavirus 2 by RT PCR: NEGATIVE

## 2021-06-27 LAB — MONONUCLEOSIS SCREEN: Mono Screen: NEGATIVE

## 2021-06-27 LAB — C-REACTIVE PROTEIN: CRP: 13.3 mg/dL — ABNORMAL HIGH (ref <1.0)

## 2021-06-27 MED ORDER — IOHEXOL 350 MG/ML SOLN
100.0000 mL | Freq: Once | INTRAVENOUS | Status: AC | PRN
  Administered 2021-06-27: 100 mL via INTRAVENOUS

## 2021-06-27 MED ORDER — COLCHICINE 0.6 MG PO TABS
0.6000 mg | ORAL_TABLET | Freq: Two times a day (BID) | ORAL | Status: DC
  Administered 2021-06-27 – 2021-07-03 (×14): 0.6 mg via ORAL
  Filled 2021-06-27 (×15): qty 1

## 2021-06-27 MED ORDER — PANTOPRAZOLE SODIUM 40 MG PO TBEC
40.0000 mg | DELAYED_RELEASE_TABLET | Freq: Every day | ORAL | Status: DC
  Administered 2021-06-27 – 2021-07-04 (×6): 40 mg via ORAL
  Filled 2021-06-27 (×7): qty 1

## 2021-06-27 MED ORDER — ACETAMINOPHEN 325 MG PO TABS
650.0000 mg | ORAL_TABLET | Freq: Once | ORAL | Status: AC
  Administered 2021-06-27: 650 mg via ORAL
  Filled 2021-06-27: qty 2

## 2021-06-27 MED ORDER — METOPROLOL TARTRATE 100 MG PO TABS
100.0000 mg | ORAL_TABLET | Freq: Once | ORAL | Status: AC
  Administered 2021-06-27: 100 mg via ORAL
  Filled 2021-06-27: qty 1

## 2021-06-27 MED ORDER — IBUPROFEN 600 MG PO TABS
600.0000 mg | ORAL_TABLET | Freq: Three times a day (TID) | ORAL | Status: DC
  Administered 2021-06-27 – 2021-07-03 (×18): 600 mg via ORAL
  Filled 2021-06-27 (×18): qty 1

## 2021-06-27 MED ORDER — ACETAMINOPHEN 650 MG RE SUPP: 650.0000 mg | Freq: Four times a day (QID) | RECTAL | Status: DC | PRN

## 2021-06-27 MED ORDER — ASPIRIN 81 MG PO CHEW
324.0000 mg | CHEWABLE_TABLET | Freq: Once | ORAL | Status: AC
  Administered 2021-06-27: 324 mg via ORAL
  Filled 2021-06-27: qty 4

## 2021-06-27 MED ORDER — LACTATED RINGERS IV BOLUS
1000.0000 mL | Freq: Once | INTRAVENOUS | Status: AC
  Administered 2021-06-27: 1000 mL via INTRAVENOUS

## 2021-06-27 MED ORDER — ACETAMINOPHEN 325 MG PO TABS
650.0000 mg | ORAL_TABLET | Freq: Four times a day (QID) | ORAL | Status: DC | PRN
  Administered 2021-06-28 – 2021-07-02 (×6): 650 mg via ORAL
  Filled 2021-06-27 (×6): qty 2

## 2021-06-27 MED ORDER — ENOXAPARIN SODIUM 40 MG/0.4ML IJ SOSY
40.0000 mg | PREFILLED_SYRINGE | INTRAMUSCULAR | Status: DC
  Administered 2021-06-27 – 2021-06-30 (×4): 40 mg via SUBCUTANEOUS
  Filled 2021-06-27 (×4): qty 0.4

## 2021-06-27 NOTE — Progress Notes (Signed)
° ° °  Echocardiogram reviewed by Dr. Royann Shivers- showed Severe AI. Formal Echo read pending  CT angio chest from this AM is not sufficient to rule out aortic dissection. Ordered CT angio chest aorta to better evaluate.   With fever, chills, newly diagnosed AI, ordered TEE to better evaluate potential endocarditis. TEE tentatively scheduled for Monday. Blood cultures drawn and pending.   Jonita Albee, PA-C 06/27/2021 4:26 PM

## 2021-06-27 NOTE — ED Triage Notes (Signed)
Pt reported to ED for evaluation. States "I think I have bacterial pneumonia", endorses chest pain, upper back pain chills, fever and cough for approximately 4-5 days.

## 2021-06-27 NOTE — Progress Notes (Signed)
Echocardiogram 2D Echocardiogram has been performed.  Martin Stafford 06/27/2021, 3:02 PM

## 2021-06-27 NOTE — ED Notes (Signed)
Echo at bedside

## 2021-06-27 NOTE — H&P (Signed)
[facility] Admission History and Physical Service Pager: [phone]  Patient name: Martin Stafford Medical record number: [id number] Date of birth: [birth year] Age: 31 y.o. Gender: male  Primary Care Provider: Patient, No Pcp Per (Inactive) Consultants: Cardiology Code Status: FULL Preferred Emergency Contact:  Contact Information     Name Relation Home Work [place] Significant other   [phone]        Chief Complaint: Chest pain  Assessment and Plan: NAFTULA DONAHUE is a 31 y.o. male presenting with nonradiating left-sided chest pain with no significant PMHx  Chest pain   throat pain   viral symptoms Pt has week long history of sore throat and left sided non radiating chest pain and some SOB with chills for past 2 days. Pt has been taking tylenol regularly for several days, unsure if he has had fever. This is his 4th visit to ED since 04/22/22. At previous visits, labs, imaging, and EKGs have been unremarkable with exception of new cardiomegaly on CXR and pt was treated with tylenol, GI cocktail and omeprazole.   In ED today pt was initially tachycardic to 119 with elevated temp of 100. Vitals have otherwise been stable with a few elevated bps. To evaluate chest pain, chest CT showed no evidence of PE, pneumothorax, effusions but did show borderline cardiomegaly. Troponins were elevated to 122> 140. EKG showed no ST segment elevations did show sinus tachycardia, incomplete RBBB.   CT neck was obtained due to sore throat which showed no acute inflammatory process. CRP elevated to 13.3, ESR elevated to 51, WBC slightly elevated to 10.8. Cardiology was consulted and based on pt's age, symptoms no risk factors, no signs of ischemia on EKG they state this pain is unlikely to be ACS. With viral symptoms of chills, elevated temp, sore throat, accompanied by chest pain with elevated inflammatory markers, will consider pericarditis in setting of  viral illness. EKG shows no diffuse ST segment elevations which can be found with pericarditis but this does not rule it out. On exam, pt does have what sounds to be either a murmur pr a friction rub which would also support diagnosis of pericarditis. Cardiology recommends echocardiogram to evaluate for pericarditis vs myocarditis and to start treatment with colchicine and ibuprofen. Can also consider GERD as cause or contributing to pts chest pain as he thinks omeprazole has helped slightly and pain in chest/throat was worse with spicy food. Will continue PPI. Will consider bacterial infection as cause of sore throat although on exam nasopharynx is without exudate or erythema. Can also consider endocarditis which could also cause chest pain, fevers and elevated inflammatory markers. Blood cultures are pending. -admit to cardiac tele under FPTS, Dr. Erin Hearing -cards consulted  -colchicine 0.6 mg BID -Ibuprofen 600 mg TID -Protonix 40 mg daily -tylenol 650 mg q6h prn -f/u on blood cultures -f/u echocardiogram -Continuous cardiac monitoring   FEN/GI: regular diet Prophylaxis: lovenox  Disposition: telemetry cardiac   History of Present Illness:  Martin Stafford is a 31 y.o. male presenting with left-sided chest pain.  Patient states he began having symptoms about 6 weeks ago after New Years after drinking two Red Bull energy drinks. He has had intermittent palpitations since then with unclear trigger. He reports a burning, aching pain in his chest and throat (left-sided) for the past week which he thought to be related to acid reflux. He was prescribed omeprazole after a recent ED visit which has helped some. He started having chills  2 days ago and possible subjective fever. He has been taking Tylenol around the clock for the past few days which does relieve pain and chills. He had a single episode of vomiting, also with fatigue and intermittent headaches. Son had a recent diarrheal/GI illness but  otherwise no sick contacts.  Per chart review, patient was seen in the ED on 2/12 due to sore throat for 3 days and palpitations for over a month and discharged with trial PPI and Tylenol.  He was seen again in the ED on 2/13 complaining of chest and neck pain and vomiting.  He was treated with a GI cocktail which improved symptoms and discharged with omeprazole and Zofran.  He was seen for third time in the ED on 2/15 with complaint of chills and declined 20 PRP, CT abdomen and urinalysis.   He had a syncopal episode about 3 years ago. He has never been evaluated by a cardiologist before.  Review Of Systems: Per HPI with the following additions:   Review of Systems  Constitutional:  Positive for appetite change, chills and fatigue.  HENT:  Positive for sore throat.   Respiratory:  Positive for shortness of breath.   Cardiovascular:  Positive for chest pain and palpitations.  Gastrointestinal:  Positive for vomiting (x1).  Neurological:  Positive for headaches.    Patient Active Problem List   Diagnosis Date Noted   Chest pain 06/27/2021   Chills    Elevated troponin    Abnormal heart sounds     Past Medical History: Past Medical History:  Diagnosis Date   Environmental allergies    Generalized skin cysts     Past Surgical History: Past Surgical History:  Procedure Laterality Date   COLON SURGERY      Social History: Social History   Tobacco Use   Smoking status: Never   Smokeless tobacco: Never  Vaping Use   Vaping Use: Never used  Substance Use Topics   Alcohol use: No   Drug use: No   Additional social history:  Denies smoking. Reports occasional alcohol use and occasional marijuana use. He works for [employer] and does heavy lifting at work.  Please also refer to relevant sections of EMR.  Family History: Family History  Problem Relation Age of Onset   Hypertension Father    Hypertension Other    Diabetes Other    Asthma Mother     Allergies and  Medications: Allergies  Allergen Reactions   Eggs Or Egg-Derived Products     "eggs"   Peanut-Containing Drug Products     "peanuts"   No current facility-administered medications on file prior to encounter.   Current Outpatient Medications on File Prior to Encounter  Medication Sig Dispense Refill   acetaminophen (TYLENOL) 325 MG tablet Take 650 mg by mouth every 6 (six) hours as needed for mild pain or headache.     Ascorbic Acid (VITAMIN C PO) Take 1 tablet by mouth daily.     omeprazole (PRILOSEC) 20 MG capsule Take 1 capsule (20 mg total) by mouth daily. 30 capsule 0   ondansetron (ZOFRAN) 4 MG tablet Take 1 tablet (4 mg total) by mouth every 6 (six) hours. 30 tablet 0   Pyridoxine HCl (VITAMIN B-6 PO) Take 1 tablet by mouth daily.     naproxen (NAPROSYN) 500 MG tablet Take 1 tablet (500 mg total) by mouth 2 (two) times daily with a meal. (Patient not taking: Reported on 06/27/2021) 30 tablet 0   pantoprazole (PROTONIX)  20 MG tablet Take 1 tablet (20 mg total) by mouth daily. (Patient not taking: Reported on 06/27/2021) 30 tablet 0   silver sulfADIAZINE (SILVADENE) 1 % cream Apply 1 application topically daily. (Patient not taking: Reported on 06/27/2021) 500 g 0    Objective: BP 126/73    Pulse 91    Temp 98.5 F (36.9 C) (Oral)    Resp 14    Ht 5' 11"  (1.803 m)    Wt 109.5 kg    SpO2 94%    BMI 33.68 kg/m  Exam: General: Pt lying comfortably in bed with mother and fiance at bedside, NAD Eyes: white sclera, clear conjunctiva ENTM: MMM. No erythema or exudate of nasopharynx Neck: no cervical lymphadenopathy Cardiovascular: regular rhythm with diastolic murmur vs friction rub Respiratory: CTAB, normal WOB MSK: good bulk and tone Derm: warm and dry Neuro: no focal deficits Psych: mood and affect appropriate for situation  Labs and Imaging: CBC BMET  Recent Labs  Lab 06/27/21 0547  WBC 10.8*  HGB 14.3  HCT 44.1  PLT 333   Recent Labs  Lab 06/27/21 0547  NA 134*  K  4.0  CL 98  CO2 25  BUN 11  CREATININE 0.91  GLUCOSE 110*  CALCIUM 9.2     EKG: NSR, 87 bpm, Qtc 470 (449 on subsequent EKG), Left ventricular hypertrophy    Martin Gilding, DO 06/27/2021, 6:53 PM PGY-1, [facility] pager: [phone], text pages welcome

## 2021-06-27 NOTE — Progress Notes (Signed)
FPTS Brief Progress Note  S: Patient sleeping comfortably, did not awaken.  O: BP 121/62 (BP Location: Left Arm)    Pulse 77    Temp 97.9 F (36.6 C) (Oral)    Resp 14    Ht 5\' 11"  (1.803 m)    Wt 109.5 kg    SpO2 96%    BMI 33.68 kg/m   General: NAD, sleeping comfortably in bed, family also asleep in room Respiratory: Breathing comfortably on room air  A/P: Chest pain, pericarditis versus endocarditis  Troponins downtrending. Echocardiogram with severe aortic regurgitation. CTA aorta negative for aneurysm or dissection.  - Cardiology following - Plan for future TEE - Continue colchicine and ibuprofen - Follow blood cultures   Rise Patience, DO 06/27/2021, 10:42 PM PGY-2, [facility] Night Resident  Please page [phone] with questions.

## 2021-06-27 NOTE — ED Provider Notes (Signed)
[facility] EMERGENCY DEPARTMENT Provider Note   CSN: [id number] Arrival date & time: 06/27/21  [id number]     History  Chief Complaint  Patient presents with   Chest Pain    Martin Stafford is a 31 y.o. male.  HPI     31 year old male comes in with chief complaint of chest pain, chills.  Patient indicates that for the past 4 to 5 days he has been having chest pain.  Chest pain is left-sided, sharp and sometimes he has noted some back pain as well.  Pain is worse with certain positions.  He has a cough, it is mostly dry.  He denies any exertional chest pain, but does indicate that he gets short of breath more easily.  Over the last 3 days he is also been having intermittent episodes of rigors that are unprovoked and he had a low-grade fever here.  Patient had come to to the ED on couple of occasions and had negative work-up, was advised that he might be having GERD.  He has been taking his medication as prescribed and has been careful with p.o. intake, symptoms have persisted.  There is family history of PE, patient's father had it. Also he indicates that [place] had gone [place], he drove on that occasion.  No substance use disorder. No unexplained weight loss.  Home Medications Prior to Admission medications   Medication Sig Start Date End Date Taking? Authorizing Provider  acetaminophen (TYLENOL) 325 MG tablet Take 650 mg by mouth every 6 (six) hours as needed for mild pain or headache.   Yes [provider]  Ascorbic Acid (VITAMIN C PO) Take 1 tablet by mouth daily.   Yes [provider]  omeprazole (PRILOSEC) 20 MG capsule Take 1 capsule (20 mg total) by mouth daily. 06/24/21  Yes Redwine, Madison A, PA-C  ondansetron (ZOFRAN) 4 MG tablet Take 1 tablet (4 mg total) by mouth every 6 (six) hours. 06/24/21  Yes Redwine, Madison A, PA-C  Pyridoxine HCl (VITAMIN B-6 PO) Take 1 tablet by mouth daily.   Yes [provider]   naproxen (NAPROSYN) 500 MG tablet Take 1 tablet (500 mg total) by mouth 2 (two) times daily with a meal. Patient not taking: Reported on 06/27/2021 04/01/21   Jaynee Eagles, PA-C  pantoprazole (PROTONIX) 20 MG tablet Take 1 tablet (20 mg total) by mouth daily. Patient not taking: Reported on 06/27/2021 06/23/21   Petrucelli, Aldona Bar R, PA-C  silver sulfADIAZINE (SILVADENE) 1 % cream Apply 1 application topically daily. Patient not taking: Reported on 06/27/2021 04/01/21   Jaynee Eagles, PA-C      Allergies    Eggs or egg-derived products and Peanut-containing drug products    Review of Systems   Review of Systems  All other systems reviewed and are negative.  Physical Exam Updated Vital Signs BP (!) 114/48    Pulse 81    Temp 98.6 F (37 C) (Oral)    Resp 15    SpO2 98%  Physical Exam Vitals and nursing note reviewed.  Constitutional:      Appearance: He is well-developed.  HENT:     Head: Atraumatic.  Cardiovascular:     Rate and Rhythm: Normal rate.  Pulmonary:     Effort: Pulmonary effort is normal.     Breath sounds: No decreased breath sounds, wheezing, rhonchi or rales.  Musculoskeletal:     Cervical back: Neck supple.     Right lower leg: No  tenderness. No edema.     Left lower leg: No tenderness. No edema.  Skin:    General: Skin is warm.  Neurological:     Mental Status: He is alert and oriented to person, place, and time.    ED Results / Procedures / Treatments   Labs (all labs ordered are listed, but only abnormal results are displayed) Labs Reviewed  CBC WITH DIFFERENTIAL/PLATELET - Abnormal; Notable for the following components:      Result Value   WBC 10.8 (*)    MCV 79.5 (*)    MCH 25.8 (*)    Neutro Abs 8.8 (*)    Abs Immature Granulocytes 0.12 (*)    All other components within normal limits  COMPREHENSIVE METABOLIC PANEL - Abnormal; Notable for the following components:   Sodium 134 (*)    Glucose, Bld 110 (*)    Total Protein 8.4 (*)    All other  components within normal limits  C-REACTIVE PROTEIN - Abnormal; Notable for the following components:   CRP 13.3 (*)    All other components within normal limits  TROPONIN I (HIGH SENSITIVITY) - Abnormal; Notable for the following components:   Troponin I (High Sensitivity) 122 (*)    All other components within normal limits  TROPONIN I (HIGH SENSITIVITY) - Abnormal; Notable for the following components:   Troponin I (High Sensitivity) 140 (*)    All other components within normal limits  RESP PANEL BY RT-PCR (FLU A&B, COVID) ARPGX2  CULTURE, BLOOD (ROUTINE X 2)  CULTURE, BLOOD (ROUTINE X 2)  MONONUCLEOSIS SCREEN  SEDIMENTATION RATE  TROPONIN I (HIGH SENSITIVITY)    EKG EKG Interpretation  Date/Time:  Thursday June 27 2021 05:21:27 EST Ventricular Rate:  110 PR Interval:  184 QRS Duration: 96 QT Interval:  332 QTC Calculation: 449 R Axis:   -48 Text Interpretation: Sinus tachycardia Biatrial enlargement Pulmonary disease pattern Incomplete right bundle branch block Left anterior fascicular block Left ventricular hypertrophy ( R in aVL , Romhilt-Estes ) Abnormal ECG When compared with ECG of 24-Jun-2021 08:46, PREVIOUS ECG IS PRESENT Confirmed by Varney Biles [id number]) on 06/27/2021 8:33:58 AM  Radiology CT Soft Tissue Neck W Contrast  Result Date: 06/27/2021 CLINICAL DATA:  31 year old male with sore throat, hoarseness, fever, chills. EXAM: CT NECK WITH CONTRAST TECHNIQUE: Multidetector CT imaging of the neck was performed using the standard protocol following the bolus administration of intravenous contrast. RADIATION DOSE REDUCTION: This exam was performed according to the departmental dose-optimization program which includes automated exposure control, adjustment of the mA and/or kV according to patient size and/or use of iterative reconstruction technique. CONTRAST:  144mL OMNIPAQUE IOHEXOL 350 MG/ML SOLN COMPARISON:  CTA chest today reported separately. FINDINGS: Pharynx  and larynx: Minor motion artifact. Larynx and epiglottis appear normal. Symmetric, physiologic appearance of the lingual tonsil, palatine tonsils and adenoids. No tonsillar hyperenhancement. No parapharyngeal or retropharyngeal space inflammation. Salivary glands: Negative sublingual space. Submandibular glands and parotid glands are symmetric and within normal limits. Thyroid: Negative. Lymph nodes: Negative. No lymphadenopathy. Bilateral cervical lymph nodes are diminutive and within normal limits. Vascular: Suboptimal intravascular contrast bolus but the major vascular structures in the neck and at the skull base appear grossly patent. Limited intracranial: Negative. Visualized orbits: Negative. Mastoids and visualized paranasal sinuses: Mild bilateral paranasal sinus mucosal thickening, most pronounced in a hypoplastic left maxillary sinus. No sinus fluid levels identified. Tympanic cavities are clear. There is right greater than left mastoid air cell sclerosis and scattered air cell opacification.  No mastoid coalescence or bone erosion identified. Skeleton: No acute dental finding. No acute osseous abnormality identified. Upper chest: Reported separately. Other: Nonspecific suboccipital and posterior neck superficial skin thickening and mild subcutaneous architectural distortion (series 3, image 38), probably chronic soft tissue scarring. IMPRESSION: 1. No acute or inflammatory process identified in the Neck. 2. Chronic appearing postinflammatory changes of the mastoid air cells, greater on the right. Minor bilateral paranasal sinus inflammation. 3. CTA Chest today reported separately. Electronically Signed   By: Genevie Ann M.D.   On: 06/27/2021 09:44   CT Angio Chest PE W and/or Wo Contrast  Result Date: 06/27/2021 CLINICAL DATA:  31 year old male with sore throat, hoarseness, fever, chills. EXAM: CT ANGIOGRAPHY CHEST WITH CONTRAST TECHNIQUE: Multidetector CT imaging of the chest was performed using the  standard protocol during bolus administration of intravenous contrast. Multiplanar CT image reconstructions and MIPs were obtained to evaluate the vascular anatomy. RADIATION DOSE REDUCTION: This exam was performed according to the departmental dose-optimization program which includes automated exposure control, adjustment of the mA and/or kV according to patient size and/or use of iterative reconstruction technique. CONTRAST:  169mL OMNIPAQUE IOHEXOL 350 MG/ML SOLN COMPARISON:  Neck CT today.  Chest radiographs 06/24/2020. FINDINGS: Cardiovascular: Suboptimal, but there is adequate opacification of the central pulmonary arteries. Bilateral subsegmental and distal branch detail degraded by motion artifact. The central and hilar pulmonary arteries are enhancing and patent. No convincing pulmonary artery filling defect elsewhere. Cardiac size at the upper limits of normal to mildly enlarged. No pericardial effusion; physiologic appearing fluid in a superior pericardial recess. Negative visible aorta. Contrast bolus timing in the pulmonary arterial tree. Mediastinum/Nodes: Negative. No mediastinal mass or lymphadenopathy. Lungs/Pleura: Major airways are patent. Mild respiratory motion. But both lungs appear negative. No pleural effusion. Minimal dependent atelectasis. Upper Abdomen: Negative visible liver, spleen, stomach. Musculoskeletal: Chronic T7-T8 disc degeneration with some vacuum disc. Otherwise negative. Review of the MIP images confirms the above findings. IMPRESSION: 1. Suboptimal evaluation for PE, with subsegmental and distal branch detail degraded. But no acute pulmonary embolus is identified. 2. Borderline to mild cardiomegaly. Otherwise negative CT appearance of the Chest. Electronically Signed   By: Genevie Ann M.D.   On: 06/27/2021 09:50    Procedures .Critical Care Performed by: Varney Biles, MD Authorized by: Varney Biles, MD   Critical care provider statement:    Critical care time  (minutes):  45   Critical care was necessary to treat or prevent imminent or life-threatening deterioration of the following conditions: Elevated heart enzymes.   Critical care was time spent personally by me on the following activities:  Development of treatment plan with patient or surrogate, discussions with consultants, evaluation of patient's response to treatment, examination of patient, ordering and review of laboratory studies, ordering and review of radiographic studies, ordering and performing treatments and interventions, pulse oximetry, re-evaluation of patient's condition and review of old charts    Medications Ordered in ED Medications  acetaminophen (TYLENOL) tablet 650 mg (650 mg Oral Given 06/27/21 0603)  lactated ringers bolus 1,000 mL (0 mLs Intravenous Stopped 06/27/21 1111)  iohexol (OMNIPAQUE) 350 MG/ML injection 100 mL (100 mLs Intravenous Contrast Given 06/27/21 0938)  aspirin chewable tablet 324 mg (324 mg Oral Given 06/27/21 1104)    ED Course/ Medical Decision Making/ A&P Clinical Course as of 06/27/21 1239  Thu Jun 27, 2021  1238 Mononucleosis screen Results reviewed, negative [AN]  1238 CT Angio Chest PE W and/or Wo Contrast Results reviewed, no evidence of  PE [AN]  [id number] CT Soft Tissue Neck W Contrast CT soft tissue neck visualized, no evidence of deep space infection.  Results from radiologist reviewed separately, they also do not see any acute findings. [AN]  1239 Patient made aware of the plan for admission. [AN]    Clinical Course User Index [AN] Varney Biles, MD                           Medical Decision Making Amount and/or Complexity of Data Reviewed Labs: ordered. Radiology: ordered.  Risk OTC drugs. Prescription drug management. Decision regarding hospitalization.   This patient presents to the ED with chief complaint(s) of 5 days of chest pain along with 3-day history of chills, also has some exertional shortness of breath with pertinent  past medical history of ED visits with normal cardiac work-up and normal chest x-ray.  There is family history of PE and patient had driven to [place] on December 31. the complaint involves an extensive differential diagnosis and treatment options and also carries with it a high risk of complications and morbidity.    The differential diagnosis includes : Viral infection -including flu or COVID ACS syndrome Aortic dissection CHF exacerbation Valvular disorder Myocarditis Pericarditis Endocarditis Pericardial effusion / tamponade Pneumonia Pleural effusion / Pulmonary edema PE Pneumothorax Musculoskeletal pain PUD / Gastritis / Esophagitis Esophageal spasm   I have reviewed prior ED visits and prior labs.  Patient had normal troponin, normal-appearing chest x-ray.  On this visit, he is noted to have elevated troponin at 122.  I am adding CT PE and we will follow-up on the repeat troponin.  I will also be adding sed rate, CRP, blood cultures to ensure there is no endocarditis, myopericarditis is also a possibility.   Additional history obtained: Additional history obtained from  fianc Records reviewed  lab results from recent ED visit, chest x-ray results from recent ED visit  Reassessment and review (also see workup area): Lab Tests: I Ordered, and personally interpreted labs.  The pertinent results include: Troponin of 122 followed by a troponin of 140.  Imaging Studies: I ordered and independently visualized and interpreted the following imaging CT scan PE   which showed no evidence of saddle PE was visualized by me.  There is no evidence of pneumothorax or large effusions. The interpretation of the imaging was limited to assessing for emergent pathology, for which purpose it was ordered.  Consultations Obtained: I requested consultation with the consultant cardiologist, Dr. Percival Spanish , and discussed  findings as well as pertinent plan - they recommend: Admission to the hospital  by medicine service, they will be on consult team.   Cardiac Monitoring: The patient was maintained on a cardiac monitor.  I personally viewed and interpreted the cardiac monitor which showed an underlying rhythm of:  sinus rhythm and sinus tachycardia  Complexity of problems addressed: Patients presentation is most consistent with  acute presentation with potential threat to life or bodily function and acute illness/injury with systemic symptoms During patient's assessment  Disposition: After consideration of the diagnostic results and the patients response to treatment,  I feel that the patent would benefit from admission to the hospital for further evaluation for elevated troponin .     Final Clinical Impression(s) / ED Diagnoses Final diagnoses:  Elevated troponin  Chills    Rx / DC Orders ED Discharge Orders     None         Varney Biles, MD  06/27/21 1239 ° °

## 2021-06-27 NOTE — ED Provider Triage Note (Signed)
Emergency Medicine Provider Triage Evaluation Note  Martin Stafford , a 31 y.o. male  was evaluated in triage.  Pt complains of chills, fevers, cough, fevers, chest pain.  Review of Systems  Positive: Chest pain, chills, cough Negative: diarrhea  Physical Exam  BP (!) 190/138 (BP Location: Right Arm)    Pulse (!) 119    Temp 100 F (37.8 C) (Oral)    Resp (!) 30    SpO2 98%  Gen:   Awake, no distress   Resp:  Normal effort  MSK:   Moves extremities without difficulty  Other:  Rigors, lungs ctab, tachycardic  Medical Decision Making  Medically screening exam initiated at 5:19 AM.  Appropriate orders placed.  Martin Stafford was informed that the remainder of the evaluation will be completed by another provider, this initial triage assessment does not replace that evaluation, and the importance of remaining in the ED until their evaluation is complete.     Karrie Meres, [place] 06/27/21 [phone]

## 2021-06-27 NOTE — Consult Note (Addendum)
Cardiology Consultation:   Patient ID: Martin Stafford MRN: [id number]; DOB: 1990-08-12  Admit date: 06/27/2021 Date of Consult: 06/27/2021  PCP:  Patient, No Pcp Per (Inactive)   [facility] Providers Cardiologist:  None       Patient Profile:   Martin Stafford is a 31 y.o. male without significant medical history who is being seen 06/27/2021 for the evaluation of chest pain at the request of Dr. Kathrynn Humble.  History of Present Illness:   Mr. Candy denies any significant cardiac history.   Patient has been to the ED 3 times in the past 4 days. He first presented on 2/12 complaining of a sore throat for the past 3 days and palpitations for the past 1.5 months. Reported that sore throat was worse with spicy foods and felt like a burning sensation. COVID/flu were negative, HSTN were within normal limits. Patient was discharged from the ED with recommendations of a trial PPI, tylenol for symptom management.   Patient returned to the ED on 2/13 complaining of chest and neck burning, vomiting. Negative troponins, normal EKG. Patient was treated with a GI cocktail which improved his symptoms, discharged with omeprazole and zofran.   Patient returned on 2/15 complaining of chills. Patient requested a GI referral and work note. ED provider offered respiratory panel but patient refused. Also refused CT abdomen pelvis and urinalysis but patient refused. '  Patient presented to the ED on 2/16 complaining of chest pain, upper back pain, chills, fever, and cough for the past 4-5 days. Labs in the ED showed Na 134, K 4.0, creatinine 0.91, hemoglobin 14.3, WBC 10.8, hematocrit 44.1. HSTN 122>>140>>114. COVID/flu negative. EKG showed sinus tachycardia, biatrial enlargement, incomplete RBBB. CT neck showed no acute inflammatory process, chronic appearing postinflammatory changes of the mastoid air cells, greater on the right. CT chest was a suboptimal evaluation for PE, but no acute PE identified.  Also showed borderline to mild cardiomegaly. CXR from 2/13 and 2/12 showed no active cardiopulmonary disease.   On interview, patient reported that he has had burning in his chest, throat for the past 4-5 days. Chest pain is left-sided, sharp, occasionally felt in the back. Worse in certain positions. Relieved with tylenol. Occasionally becomes SOB if pain is severe. Denies any SOB on exertion or while lying flat on back. Also has noticed chills at night. Dry cough for the past few days. Denies any tobacco use. Occasionally smokes marijuana. Is very active in his day to day life. Maternal grandfather had a cardiac stent placed.   Past Medical History:  Diagnosis Date   Environmental allergies    Generalized skin cysts     Past Surgical History:  Procedure Laterality Date   COLON SURGERY       Home Medications:  Prior to Admission medications   Medication Sig Start Date End Date Taking? Authorizing Provider  HYDROcodone-acetaminophen (NORCO/VICODIN) 5-325 MG tablet Take one by mouth at bedtime as needed for pain.  May repeat in 4 to 6 hour prn 02/19/18   Kandra Nicolas, MD  naproxen (NAPROSYN) 500 MG tablet Take 1 tablet (500 mg total) by mouth 2 (two) times daily with a meal. 04/01/21   Jaynee Eagles, PA-C  omeprazole (PRILOSEC) 20 MG capsule Take 1 capsule (20 mg total) by mouth daily. 06/24/21   Redwine, Madison A, PA-C  ondansetron (ZOFRAN) 4 MG tablet Take 1 tablet (4 mg total) by mouth every 6 (six) hours. 06/24/21   Redwine, Madison A, PA-C  pantoprazole (PROTONIX) 20 MG  tablet Take 1 tablet (20 mg total) by mouth daily. 06/23/21   Petrucelli, Samantha R, PA-C  silver sulfADIAZINE (SILVADENE) 1 % cream Apply 1 application topically daily. 04/01/21   Jaynee Eagles, PA-C    Inpatient Medications: Scheduled Meds:  Continuous Infusions:  PRN Meds:   Allergies:    Allergies  Allergen Reactions   Eggs Or Egg-Derived Products     "eggs"   Peanut-Containing Drug Products      "peanuts"    Social History:   Social History   Socioeconomic History   Marital status: Single    Spouse name: Not on file   Number of children: Not on file   Years of education: Not on file   Highest education level: Not on file  Occupational History   Not on file  Tobacco Use   Smoking status: Never   Smokeless tobacco: Never  Vaping Use   Vaping Use: Never used  Substance and Sexual Activity   Alcohol use: No   Drug use: No   Sexual activity: Not on file  Other Topics Concern   Not on file  Social History Narrative   Not on file   Social Determinants of Health   Financial Resource Strain: Not on file  Food Insecurity: Not on file  Transportation Needs: Not on file  Physical Activity: Not on file  Stress: Not on file  Social Connections: Not on file  Intimate Partner Violence: Not on file    Family History:    Family History  Problem Relation Age of Onset   Hypertension Father    Hypertension Other    Diabetes Other    Asthma Mother      ROS:  Please see the history of present illness.   All other ROS reviewed and negative.     Physical Exam/Data:   Vitals:   06/27/21 1145 06/27/21 1200 06/27/21 1215 06/27/21 1230  BP: (!) 141/71 139/77 137/69 (!) 114/48  Pulse: 75 82 81 81  Resp: 12 13 11 15   Temp:      TempSrc:      SpO2: 99% 99% 97% 98%    Intake/Output Summary (Last 24 hours) at 06/27/2021 1324 Last data filed at 06/27/2021 1111 Gross per 24 hour  Intake 999 ml  Output --  Net 999 ml   Last 3 Weights 06/26/2021 06/24/2021 03/19/2020  Weight (lbs) 235 lb 235 lb 235 lb  Weight (kg) 106.595 kg 106.595 kg 106.595 kg     There is no height or weight on file to calculate BMI.  General:  Well nourished, well developed, in no acute distress HEENT: normal Neck: no JVD Vascular: Radial pulses 2+ bilaterally Cardiac:  normal S1, S2; RRR; friction rub heard at left sternal border Lungs:  clear to auscultation bilaterally, no wheezing, rhonchi or  rales  Abd: soft, nontender, no hepatomegaly  Ext: no edema Musculoskeletal:  No deformities, BUE and BLE strength normal and equal Skin: warm and dry  Neuro:  CNs 2-12 intact, no focal abnormalities noted Psych:  Normal affect   EKG:  The EKG was personally reviewed and demonstrates:  Sinus tachycardia, rate  Telemetry:  Telemetry was personally reviewed and demonstrates:  Sinus rhythm   Relevant CV Studies:  Laboratory Data:  High Sensitivity Troponin:   Recent Labs  Lab 06/23/21 0528 06/24/21 0903 06/27/21 0547 06/27/21 0740  TROPONINIHS 5 4 122* 140*     Chemistry Recent Labs  Lab 06/23/21 0528 06/24/21 0903 06/27/21 0547  NA 136 134*  134*  K 3.9 3.6 4.0  CL 105 102 98  CO2 22 24 25   GLUCOSE 106* 106* 110*  BUN 14 12 11   CREATININE 0.74 0.85 0.91  CALCIUM 9.0 8.8* 9.2  GFRNONAA >60 >60 >60  ANIONGAP 9 8 11     Recent Labs  Lab 06/23/21 0528 06/27/21 0547  PROT 7.6 8.4*  ALBUMIN 3.5 3.8  AST 13* 25  ALT 19 24  ALKPHOS 40 50  BILITOT 0.4 0.7   Lipids No results for input(s): CHOL, TRIG, HDL, LABVLDL, LDLCALC, CHOLHDL in the last 168 hours.  Hematology Recent Labs  Lab 06/23/21 0528 06/24/21 0903 06/27/21 0547  WBC 8.5 9.3 10.8*  RBC 5.19 5.21 5.55  HGB 13.7 13.8 14.3  HCT 41.2 40.4 44.1  MCV 79.4* 77.5* 79.5*  MCH 26.4 26.5 25.8*  MCHC 33.3 34.2 32.4  RDW 14.2 14.2 14.1  PLT 281 269 333   Thyroid No results for input(s): TSH, FREET4 in the last 168 hours.  BNPNo results for input(s): BNP, PROBNP in the last 168 hours.  DDimer No results for input(s): DDIMER in the last 168 hours.   Radiology/Studies:  CT Soft Tissue Neck W Contrast  Result Date: 06/27/2021 CLINICAL DATA:  31 year old male with sore throat, hoarseness, fever, chills. EXAM: CT NECK WITH CONTRAST TECHNIQUE: Multidetector CT imaging of the neck was performed using the standard protocol following the bolus administration of intravenous contrast. RADIATION DOSE REDUCTION: This  exam was performed according to the departmental dose-optimization program which includes automated exposure control, adjustment of the mA and/or kV according to patient size and/or use of iterative reconstruction technique. CONTRAST:  142m OMNIPAQUE IOHEXOL 350 MG/ML SOLN COMPARISON:  CTA chest today reported separately. FINDINGS: Pharynx and larynx: Minor motion artifact. Larynx and epiglottis appear normal. Symmetric, physiologic appearance of the lingual tonsil, palatine tonsils and adenoids. No tonsillar hyperenhancement. No parapharyngeal or retropharyngeal space inflammation. Salivary glands: Negative sublingual space. Submandibular glands and parotid glands are symmetric and within normal limits. Thyroid: Negative. Lymph nodes: Negative. No lymphadenopathy. Bilateral cervical lymph nodes are diminutive and within normal limits. Vascular: Suboptimal intravascular contrast bolus but the major vascular structures in the neck and at the skull base appear grossly patent. Limited intracranial: Negative. Visualized orbits: Negative. Mastoids and visualized paranasal sinuses: Mild bilateral paranasal sinus mucosal thickening, most pronounced in a hypoplastic left maxillary sinus. No sinus fluid levels identified. Tympanic cavities are clear. There is right greater than left mastoid air cell sclerosis and scattered air cell opacification. No mastoid coalescence or bone erosion identified. Skeleton: No acute dental finding. No acute osseous abnormality identified. Upper chest: Reported separately. Other: Nonspecific suboccipital and posterior neck superficial skin thickening and mild subcutaneous architectural distortion (series 3, image 38), probably chronic soft tissue scarring. IMPRESSION: 1. No acute or inflammatory process identified in the Neck. 2. Chronic appearing postinflammatory changes of the mastoid air cells, greater on the right. Minor bilateral paranasal sinus inflammation. 3. CTA Chest today reported  separately. Electronically Signed   By: HGenevie AnnM.D.   On: 06/27/2021 09:44   CT Angio Chest PE W and/or Wo Contrast  Result Date: 06/27/2021 CLINICAL DATA:  31year old male with sore throat, hoarseness, fever, chills. EXAM: CT ANGIOGRAPHY CHEST WITH CONTRAST TECHNIQUE: Multidetector CT imaging of the chest was performed using the standard protocol during bolus administration of intravenous contrast. Multiplanar CT image reconstructions and MIPs were obtained to evaluate the vascular anatomy. RADIATION DOSE REDUCTION: This exam was performed according to the departmental dose-optimization program  which includes automated exposure control, adjustment of the mA and/or kV according to patient size and/or use of iterative reconstruction technique. CONTRAST:  115m OMNIPAQUE IOHEXOL 350 MG/ML SOLN COMPARISON:  Neck CT today.  Chest radiographs 06/24/2020. FINDINGS: Cardiovascular: Suboptimal, but there is adequate opacification of the central pulmonary arteries. Bilateral subsegmental and distal branch detail degraded by motion artifact. The central and hilar pulmonary arteries are enhancing and patent. No convincing pulmonary artery filling defect elsewhere. Cardiac size at the upper limits of normal to mildly enlarged. No pericardial effusion; physiologic appearing fluid in a superior pericardial recess. Negative visible aorta. Contrast bolus timing in the pulmonary arterial tree. Mediastinum/Nodes: Negative. No mediastinal mass or lymphadenopathy. Lungs/Pleura: Major airways are patent. Mild respiratory motion. But both lungs appear negative. No pleural effusion. Minimal dependent atelectasis. Upper Abdomen: Negative visible liver, spleen, stomach. Musculoskeletal: Chronic T7-T8 disc degeneration with some vacuum disc. Otherwise negative. Review of the MIP images confirms the above findings. IMPRESSION: 1. Suboptimal evaluation for PE, with subsegmental and distal branch detail degraded. But no acute pulmonary  embolus is identified. 2. Borderline to mild cardiomegaly. Otherwise negative CT appearance of the Chest. Electronically Signed   By: HGenevie AnnM.D.   On: 06/27/2021 09:50   DG Chest Portable 1 View  Result Date: 06/24/2021 CLINICAL DATA:  Cough, chest pain EXAM: PORTABLE CHEST 1 VIEW COMPARISON:  06/23/2021 FINDINGS: Stable cardiomediastinal contours. Low lung volumes. No focal airspace consolidation, pleural effusion, or pneumothorax. IMPRESSION: No active disease. Electronically Signed   By: NDavina PokeD.O.   On: 06/24/2021 09:09     Assessment and Plan:   Chest pain  - Patient has been having left sided chest pain for 4-5 days. Sharp. Worse in certain positions. Not associated with exertion. Improved with tylenol. Associated with dry cough, chills and low grade fever in ED. He has been seen in the ED on 2/12, 2/13, 2/15 for similar complaints.  - HSTN have been negative at past ER visits. HSTN 122>>140>>114 today  - EKG shows sinus rhythm, possible biatrial enlargement. No signs of ischemia   - CXR shows no acute cardiopulmonary disease  - Chest CT showed borderline-mild cardiomegaly  - CRP 13.3, ESR 51 - Suspect pericarditis vs myopericarditis based on elevated inflammatory markers, chills/fever, description of pain, friction rub on physical exam, elevated troponins. However, troponins are only mildly elevated, so it is difficult to determine if there is true myocardial involvement  - Given patient's age, lack of risk factors, mild and flat trop elevation, lack of ischemic findings on EKG, and atypical description of chest pain, it is very unlikely to be ACS.  - Ordered echocardiogram to assess LV function  - For further evaluation, can consider cardiac MRI  - Will start colchicine 0.631mtwice daily, ibuprofen 60082mID      Risk Assessment/Risk Scores:   HEAR Score (for undifferentiated chest pain):  HEAR Score: 1{       For questions or updates, please contact [facility] consult [email or web address] for contact info under    Signed, KatMargie BilletA-C  06/27/2021 1:24 PM  I have seen and examined the patient along with KatMargie BilletA-C .  I have reviewed the chart, notes and new data.  I agree with PA/NP's note.  Key new complaints: The clinical presentation, including the prodromal sore throat and fever/chills and positional sharp chest discomfort are all strongly suggestive of acute pericarditis. Key examination changes: Cardiovascular exam shows regular rate and rhythm and  is significant for loud systolic and diastolic sounds heard broadly across the entire precordium, especially loud at the right and left upper sternal border.  While these may represent the systolic and diastolic components of a pericardial rub they are unusually extensive in area and loud in intensity.  It sounds like severe aortic insufficiency with an accompanying aortic ejection murmur.   Key new findings / data: Elevated ESR and CRP are consistent with acute inflammatory disorder rather than aortic dissection.  ECGs show sinus rhythm, biatrial enlargement, subtle ST segment elevation in leads V1-V3 and T wave inversion in the lateral leads, but these are findings present on ECGs as far back as 2019 (when there was also high voltage suggestive of LVH).  Viral studies negative for flu, COVID, infectious mono.  PLAN: Most likely acute pericarditis (possibly a small component of myocarditis), but the physical exam is atypical. Would worry about an unusual presentation of aortic dissection (maybe on a background of bicuspid aortic valve causing aortic insufficiency) or even aortic valve endocarditis.  CT angiogram of the chest did not show evidence of aortic root enlargement or dissection, but was a study geared for evaluation of the pulmonary circulation.  We will get an echocardiogram today.  Blood cultures have already been ordered. The suspicion for a coronary  insufficiency syndrome is very low.  He should not receive anticoagulation.  Sanda Klein, MD, [place] [phone] 06/27/2021, 2:02 PM

## 2021-06-28 DIAGNOSIS — L732 Hidradenitis suppurativa: Secondary | ICD-10-CM

## 2021-06-28 DIAGNOSIS — R778 Other specified abnormalities of plasma proteins: Secondary | ICD-10-CM | POA: Diagnosis not present

## 2021-06-28 DIAGNOSIS — D62 Acute posthemorrhagic anemia: Secondary | ICD-10-CM | POA: Diagnosis not present

## 2021-06-28 DIAGNOSIS — I351 Nonrheumatic aortic (valve) insufficiency: Secondary | ICD-10-CM | POA: Diagnosis not present

## 2021-06-28 DIAGNOSIS — I358 Other nonrheumatic aortic valve disorders: Secondary | ICD-10-CM | POA: Diagnosis not present

## 2021-06-28 DIAGNOSIS — R6883 Chills (without fever): Secondary | ICD-10-CM

## 2021-06-28 DIAGNOSIS — A419 Sepsis, unspecified organism: Secondary | ICD-10-CM | POA: Diagnosis not present

## 2021-06-28 DIAGNOSIS — R509 Fever, unspecified: Secondary | ICD-10-CM | POA: Diagnosis not present

## 2021-06-28 DIAGNOSIS — Q245 Malformation of coronary vessels: Secondary | ICD-10-CM | POA: Diagnosis not present

## 2021-06-28 DIAGNOSIS — Z20822 Contact with and (suspected) exposure to covid-19: Secondary | ICD-10-CM | POA: Diagnosis not present

## 2021-06-28 DIAGNOSIS — R6520 Severe sepsis without septic shock: Secondary | ICD-10-CM

## 2021-06-28 DIAGNOSIS — R072 Precordial pain: Secondary | ICD-10-CM | POA: Diagnosis not present

## 2021-06-28 LAB — BASIC METABOLIC PANEL
Anion gap: 9 (ref 5–15)
BUN: 11 mg/dL (ref 6–20)
CO2: 26 mmol/L (ref 22–32)
Calcium: 8.8 mg/dL — ABNORMAL LOW (ref 8.9–10.3)
Chloride: 102 mmol/L (ref 98–111)
Creatinine, Ser: 0.84 mg/dL (ref 0.61–1.24)
GFR, Estimated: >60 mL/min (ref >60)
Glucose, Bld: 92 mg/dL (ref 70–99)
Potassium: 3.6 mmol/L (ref 3.5–5.1)
Sodium: 137 mmol/L (ref 135–145)

## 2021-06-28 LAB — ECHOCARDIOGRAM COMPLETE
AR max vel: 6.63 cm2
AV Peak grad: 16 mmHg
Ao pk vel: 2 m/s
Area-P 1/2: 4.39 cm2
S' Lateral: 3.8 cm

## 2021-06-28 LAB — CBC
HCT: 38.7 % — ABNORMAL LOW (ref 39.0–52.0)
Hemoglobin: 12.9 g/dL — ABNORMAL LOW (ref 13.0–17.0)
MCH: 26.5 pg (ref 26.0–34.0)
MCHC: 33.3 g/dL (ref 30.0–36.0)
MCV: 79.5 fL — ABNORMAL LOW (ref 80.0–100.0)
Platelets: 272 10*3/uL (ref 150–400)
RBC: 4.87 MIL/uL (ref 4.22–5.81)
RDW: 14 % (ref 11.5–15.5)
WBC: 9.8 10*3/uL (ref 4.0–10.5)
nRBC: 0 % (ref 0.0–0.2)

## 2021-06-28 LAB — HIV ANTIBODY (ROUTINE TESTING W REFLEX): HIV Screen 4th Generation wRfx: NONREACTIVE

## 2021-06-28 MED ORDER — SODIUM CHLORIDE 0.9 % IV SOLN
INTRAVENOUS | Status: DC
Start: 2021-06-28 — End: 2021-07-01

## 2021-06-28 MED ORDER — VANCOMYCIN HCL 2000 MG/400ML IV SOLN
2000.0000 mg | Freq: Once | INTRAVENOUS | Status: AC
  Administered 2021-06-28: 2000 mg via INTRAVENOUS
  Filled 2021-06-28: qty 400

## 2021-06-28 MED ORDER — SODIUM CHLORIDE 0.9 % IV SOLN
2.0000 g | Freq: Once | INTRAVENOUS | Status: AC
  Administered 2021-06-28: 2 g via INTRAVENOUS
  Filled 2021-06-28: qty 2

## 2021-06-28 MED ORDER — LACTATED RINGERS IV BOLUS
1000.0000 mL | Freq: Once | INTRAVENOUS | Status: AC
  Administered 2021-06-28: 1000 mL via INTRAVENOUS

## 2021-06-28 MED ORDER — VANCOMYCIN HCL 1500 MG/300ML IV SOLN
1500.0000 mg | Freq: Two times a day (BID) | INTRAVENOUS | Status: DC
Start: 2021-06-28 — End: 2021-07-02
  Administered 2021-06-28 – 2021-07-02 (×8): 1500 mg via INTRAVENOUS
  Filled 2021-06-28 (×9): qty 300

## 2021-06-28 MED ORDER — SODIUM CHLORIDE 0.9 % IV SOLN
2.0000 g | INTRAVENOUS | Status: DC
  Administered 2021-06-28 – 2021-07-04 (×7): 2 g via INTRAVENOUS
  Filled 2021-06-28 (×8): qty 20

## 2021-06-28 MED ORDER — SODIUM CHLORIDE 0.9 % IV SOLN: 2.0000 g | Freq: Three times a day (TID) | INTRAVENOUS | Status: DC

## 2021-06-28 NOTE — Progress Notes (Signed)
FPTS Brief Progress Note  S: Mr. Ballow was sleeping when I came by on my evening rounds.  I did not wake him.   O: BP 127/79 (BP Location: Left Arm)    Pulse 80    Temp 97.8 F (36.6 C) (Oral)    Resp 18    Ht 5\' 11"  (1.803 m)    Wt 109.5 kg    SpO2 98%    BMI 33.68 kg/m   General: Sleeping comfortably, male companion also sleeping at bedside  A/P: Afebrile since 0830 this am. Continue to monitor blood cultures. TEE scheduled for Monday.  Continue CTX and Vancomycin.  - Orders reviewed. Labs for AM ordered, which was adjusted as needed.    Tuesday, MD 06/28/2021, 10:17 PM PGY-1, [facility] Night Resident  Please page [phone] with questions.

## 2021-06-28 NOTE — Progress Notes (Addendum)
Progress Note  Patient Name: Martin Stafford Date of Encounter: 06/28/2021  [facility] Cardiologist: Sanda Klein, MD   Subjective   Patient reports that his chest pain is somewhat improved, he was able to tolerate eating dinner last night. Woke up this morning around 6 AM with chills, body aches. Denies palpitations.   Inpatient Medications    Scheduled Meds:  colchicine  0.6 mg Oral BID   enoxaparin (LOVENOX) injection  40 mg Subcutaneous Q24H   ibuprofen  600 mg Oral TID   pantoprazole  40 mg Oral Daily   Continuous Infusions:  ceFEPime (MAXIPIME) IV     ceFEPime (MAXIPIME) IV     vancomycin     vancomycin 2,000 mg (06/28/21 0832)   PRN Meds: acetaminophen **OR** acetaminophen   Vital Signs    Vitals:   06/28/21 0416 06/28/21 0651 06/28/21 0700 06/28/21 0828  BP: (!) 104/46  (!) 144/70   Pulse: 73 (!) 101 (!) 101   Resp:   20   Temp: 98.5 F (36.9 C) (!) 101.9 F (38.8 C) 100.3 F (37.9 C) (!) 100.6 F (38.1 C)  TempSrc: Oral Rectal Oral Rectal  SpO2: 97% 100% 97%   Weight:      Height:        Intake/Output Summary (Last 24 hours) at 06/28/2021 0905 Last data filed at 06/27/2021 1111 Gross per 24 hour  Intake 999 ml  Output --  Net 999 ml   Last 3 Weights 06/27/2021 06/26/2021 06/24/2021  Weight (lbs) 241 lb 8 oz 235 lb 235 lb  Weight (kg) 109.544 kg 106.595 kg 106.595 kg      Telemetry    Sinus rhythm, HR in 90s - Personally Reviewed  ECG    No new tracings - Personally Reviewed  Physical Exam   GEN: No acute distress.  Sitting comfortably in the bed  Neck: No JVD Cardiac: RRR. Grade 3/6 diastolic and systolic murmurs heard throughout pericardium, worse at LUSB Respiratory: Clear to auscultation bilaterally. GI: Soft, nontender, non-distended  MS: No edema; No deformity. Neuro:  Nonfocal  Psych: Normal affect   Labs    High Sensitivity Troponin:   Recent Labs  Lab 06/24/21 0903 06/27/21 0547 06/27/21 0740 06/27/21 1237  06/27/21 1549  TROPONINIHS 4 122* 140* 114* 109*     Chemistry Recent Labs  Lab 06/23/21 0528 06/24/21 0903 06/27/21 0547 06/28/21 0233  NA 136 134* 134* 137  K 3.9 3.6 4.0 3.6  CL 105 102 98 102  CO2 _0 GLUCOSE 106* 106* 110* 92  BUN _1 CREATININE 0.74 0.85 0.91 0.84  CALCIUM 9.0 8.8* 9.2 8.8*  PROT 7.6  --  8.4*  --   ALBUMIN 3.5  --  3.8  --   AST 13*  --  25  --   ALT 19  --  24  --   ALKPHOS 40  --  50  --   BILITOT 0.4  --  0.7  --   GFRNONAA >60 >60 >60 >60  ANIONGAP _2 Lipids No results for input(s): CHOL, TRIG, HDL, LABVLDL, LDLCALC, CHOLHDL in the last 168 hours.  Hematology Recent Labs  Lab 06/24/21 0903 06/27/21 0547 06/28/21 0233  WBC 9.3 10.8* 9.8  RBC 5.21 5.55 4.87  HGB 13.8 14.3 12.9*  HCT 40.4 44.1 38.7*  MCV 77.5* 79.5* 79.5*  MCH 26.5 25.8* 26.5  MCHC 34.2 32.4 33.3  RDW 14.2  14.1 14.0  PLT 269 333 272   Thyroid No results for input(s): TSH, FREET4 in the last 168 hours.  BNPNo results for input(s): BNP, PROBNP in the last 168 hours.  DDimer No results for input(s): DDIMER in the last 168 hours.   Radiology    CT Soft Tissue Neck W Contrast  Result Date: 06/27/2021 CLINICAL DATA:  31 year old male with sore throat, hoarseness, fever, chills. EXAM: CT NECK WITH CONTRAST TECHNIQUE: Multidetector CT imaging of the neck was performed using the standard protocol following the bolus administration of intravenous contrast. RADIATION DOSE REDUCTION: This exam was performed according to the departmental dose-optimization program which includes automated exposure control, adjustment of the mA and/or kV according to patient size and/or use of iterative reconstruction technique. CONTRAST:  131m OMNIPAQUE IOHEXOL 350 MG/ML SOLN COMPARISON:  CTA chest today reported separately. FINDINGS: Pharynx and larynx: Minor motion artifact. Larynx and epiglottis appear normal. Symmetric, physiologic appearance of the lingual tonsil,  palatine tonsils and adenoids. No tonsillar hyperenhancement. No parapharyngeal or retropharyngeal space inflammation. Salivary glands: Negative sublingual space. Submandibular glands and parotid glands are symmetric and within normal limits. Thyroid: Negative. Lymph nodes: Negative. No lymphadenopathy. Bilateral cervical lymph nodes are diminutive and within normal limits. Vascular: Suboptimal intravascular contrast bolus but the major vascular structures in the neck and at the skull base appear grossly patent. Limited intracranial: Negative. Visualized orbits: Negative. Mastoids and visualized paranasal sinuses: Mild bilateral paranasal sinus mucosal thickening, most pronounced in a hypoplastic left maxillary sinus. No sinus fluid levels identified. Tympanic cavities are clear. There is right greater than left mastoid air cell sclerosis and scattered air cell opacification. No mastoid coalescence or bone erosion identified. Skeleton: No acute dental finding. No acute osseous abnormality identified. Upper chest: Reported separately. Other: Nonspecific suboccipital and posterior neck superficial skin thickening and mild subcutaneous architectural distortion (series 3, image 38), probably chronic soft tissue scarring. IMPRESSION: 1. No acute or inflammatory process identified in the Neck. 2. Chronic appearing postinflammatory changes of the mastoid air cells, greater on the right. Minor bilateral paranasal sinus inflammation. 3. CTA Chest today reported separately. Electronically Signed   By: HGenevie AnnM.D.   On: 06/27/2021 09:44   CT Angio Chest PE W and/or Wo Contrast  Result Date: 06/27/2021 CLINICAL DATA:  31year old male with sore throat, hoarseness, fever, chills. EXAM: CT ANGIOGRAPHY CHEST WITH CONTRAST TECHNIQUE: Multidetector CT imaging of the chest was performed using the standard protocol during bolus administration of intravenous contrast. Multiplanar CT image reconstructions and MIPs were obtained to  evaluate the vascular anatomy. RADIATION DOSE REDUCTION: This exam was performed according to the departmental dose-optimization program which includes automated exposure control, adjustment of the mA and/or kV according to patient size and/or use of iterative reconstruction technique. CONTRAST:  1034mOMNIPAQUE IOHEXOL 350 MG/ML SOLN COMPARISON:  Neck CT today.  Chest radiographs 06/24/2020. FINDINGS: Cardiovascular: Suboptimal, but there is adequate opacification of the central pulmonary arteries. Bilateral subsegmental and distal branch detail degraded by motion artifact. The central and hilar pulmonary arteries are enhancing and patent. No convincing pulmonary artery filling defect elsewhere. Cardiac size at the upper limits of normal to mildly enlarged. No pericardial effusion; physiologic appearing fluid in a superior pericardial recess. Negative visible aorta. Contrast bolus timing in the pulmonary arterial tree. Mediastinum/Nodes: Negative. No mediastinal mass or lymphadenopathy. Lungs/Pleura: Major airways are patent. Mild respiratory motion. But both lungs appear negative. No pleural effusion. Minimal dependent atelectasis. Upper Abdomen: Negative visible liver, spleen, stomach. Musculoskeletal: Chronic T7-T8  disc degeneration with some vacuum disc. Otherwise negative. Review of the MIP images confirms the above findings. IMPRESSION: 1. Suboptimal evaluation for PE, with subsegmental and distal branch detail degraded. But no acute pulmonary embolus is identified. 2. Borderline to mild cardiomegaly. Otherwise negative CT appearance of the Chest. Electronically Signed   By: Genevie Ann M.D.   On: 06/27/2021 09:50   CT ANGIO CHEST AORTA W/CM & OR WO/CM  Result Date: 06/27/2021 CLINICAL DATA:  Fever, chills, newly diagnosed aortic insufficiency, question aortic dissection. Had CTA chest for pulmonary embolism earlier EXAM: CT ANGIOGRAPHY CHEST WITH CONTRAST TECHNIQUE: Multidetector CT imaging of the chest was  performed using the standard protocol during bolus administration of intravenous contrast. Multiplanar CT image reconstructions and MIPs were obtained to evaluate the vascular anatomy. RADIATION DOSE REDUCTION: This exam was performed according to the departmental dose-optimization program which includes automated exposure control, adjustment of the mA and/or kV according to patient size and/or use of iterative reconstruction technique. CONTRAST:  164m OMNIPAQUE IOHEXOL 350 MG/ML SOLN IV COMPARISON:  Earlier study of 06/27/2021 FINDINGS: Cardiovascular: Small pericardial effusion. Mild enlargement of cardiac chambers. Aorta normal caliber without aneurysm or dissection. Pulmonary arteries appear patent on non targeted exam. No gross pulmonary embolism. Mediastinum/Nodes: Esophagus unremarkable. Base of cervical region normal appearance. No thoracic adenopathy. Lungs/Pleura: Minimal dependent atelectasis in the posterior lower lobes. Lungs otherwise clear. No pulmonary infiltrate, pleural effusion, or pneumothorax. Upper Abdomen: Small cyst at upper pole LEFT kidney. Visualized upper abdomen otherwise unremarkable. Musculoskeletal: Osseous structures unremarkable. Review of the MIP images confirms the above findings. IMPRESSION: No evidence of aortic aneurysm or aortic dissection. Small pericardial effusion with mild enlargement of cardiac chambers. Minimal dependent atelectasis in the posterior lower lobes. Electronically Signed   By: MLavonia DanaM.D.   On: 06/27/2021 18:24   ECHOCARDIOGRAM COMPLETE  Result Date: 06/27/2021    ECHOCARDIOGRAM REPORT   Patient Name:   Martin EISNERDate of Exam: 06/27/2021 Medical Rec #:  [id number]        Height:       71.0 in Accession #:    [id number]       Weight:       235.0 lb Date of Birth:  202-Mar-1992        BSA:          2.258 m Patient Age:    30 years          BP:           114/48 mmHg Patient Gender: M                 HR:           88 bpm. Exam Location:   Inpatient Procedure: 2D Echo Indications:     Chest pain  History:         Patient has no prior history of Echocardiogram examinations.  Sonographer:     CJefferey PicaReferring Phys:  [id number] BilletDiagnosing Phys: HGwyndolyn KaufmanMD IMPRESSIONS  1. The aortic valve leafets are not well visualized but the valve appears tricuspid with no significant calcification/thickening. There is very eccentric, severe aortic regurgitation with diffuse color flow seen in the LVOT. There is diastolic flow reversal noted in the abdominal aorta. No distinct vegetations visualized. No dissection flap seen but minimal visualization of the ascending aorta. Recommend TEE +/- CTA for further evaluation.  2. Left ventricular ejection fraction, by estimation, is 55 to 60%. The  left ventricle has normal function. The left ventricle has no regional wall motion abnormalities. There is moderate concentric left ventricular hypertrophy. Left ventricular diastolic parameters are consistent with Grade I diastolic dysfunction (impaired relaxation).  3. Right ventricular systolic function is normal. The right ventricular size is normal.  4. Left atrial size was mildly dilated.  5. A small pericardial effusion is present. The pericardial effusion is circumferential.  6. The mitral valve is normal in structure. Trivial mitral valve regurgitation.  7. The inferior vena cava is dilated in size with <50% respiratory variability, suggesting right atrial pressure of 15 mmHg.  8. Aortic dilatation noted. There is mild dilatation of the aortic root, measuring 39 mm. Comparison(s): No prior Echocardiogram. FINDINGS  Left Ventricle: Left ventricular ejection fraction, by estimation, is 55 to 60%. The left ventricle has normal function. The left ventricle has no regional wall motion abnormalities. The left ventricular internal cavity size was normal in size. There is  moderate concentric left ventricular hypertrophy. Left ventricular  diastolic parameters are consistent with Grade I diastolic dysfunction (impaired relaxation). Right Ventricle: The right ventricular size is normal. No increase in right ventricular wall thickness. Right ventricular systolic function is normal. Left Atrium: Left atrial size was mildly dilated. Right Atrium: Right atrial size was normal in size. Pericardium: A small pericardial effusion is present. The pericardial effusion is circumferential. Mitral Valve: The mitral valve is normal in structure. Trivial mitral valve regurgitation. Tricuspid Valve: The tricuspid valve is normal in structure. Tricuspid valve regurgitation is trivial. Aortic Valve: The aortic valve leafets are not well visualized but the valve appears tricuspid with minimal calcification/thickening. There is very eccentric, severe aortic regurgitation with diffuse color flow seen in the LVOT. There is diastolic flow reversal noted in the abdominal aorta. Recommend TEE for further evaluation. The aortic valve was not well visualized. Aortic valve regurgitation is severe. No aortic stenosis is present. Aortic valve peak gradient measures 16.0 mmHg. Pulmonic Valve: The pulmonic valve was normal in structure. Pulmonic valve regurgitation is trivial. Aorta: Aortic dilatation noted. There is mild dilatation of the aortic root, measuring 39 mm. Venous: The inferior vena cava is dilated in size with less than 50% respiratory variability, suggesting right atrial pressure of 15 mmHg. IAS/Shunts: The atrial septum is grossly normal.  LEFT VENTRICLE PLAX 2D LVIDd:         5.60 cm LVIDs:         3.80 cm LV PW:         1.65 cm LV IVS:        1.60 cm LVOT diam:     3.40 cm LV SV:         270 LV SV Index:   119 LVOT Area:     9.08 cm  RIGHT VENTRICLE             IVC RV Basal diam:  3.10 cm     IVC diam: 2.30 cm RV S prime:     15.50 cm/s TAPSE (M-mode): 2.6 cm LEFT ATRIUM             Index        RIGHT ATRIUM           Index LA diam:        4.00 cm 1.77 cm/m   RA  Area:     17.60 cm LA Vol (A2C):   84.2 ml 37.29 ml/m  RA Volume:   46.80 ml  20.72 ml/m LA Vol (A4C):  72.0 ml 31.88 ml/m LA Biplane Vol: 80.7 ml 35.74 ml/m  AORTIC VALVE                 PULMONIC VALVE AV Area (Vmax): 6.63 cm     PV Vmax:       1.09 m/s AV Vmax:        200.00 cm/s  PV Peak grad:  4.8 mmHg AV Peak Grad:   16.0 mmHg LVOT Vmax:      146.00 cm/s LVOT Vmean:     86.400 cm/s LVOT VTI:       0.297 m  AORTA Ao Asc diam: 3.10 cm MITRAL VALVE MV Area (PHT): 4.39 cm     SHUNTS MV Decel Time: 173 msec     Systemic VTI:  0.30 m MV E velocity: 82.80 cm/s   Systemic Diam: 3.40 cm MV A velocity: 106.00 cm/s MV E/A ratio:  0.78 Gwyndolyn Kaufman MD Electronically signed by Gwyndolyn Kaufman MD Signature Date/Time: 06/27/2021/4:10:46 PM    Final (Updated)     Cardiac Studies   Echocardiogram 06/27/2021    1. The aortic valve leafets are not well visualized but the valve appears  tricuspid with no significant calcification/thickening. There is very  eccentric, severe aortic regurgitation with diffuse color flow seen in the  LVOT. There is diastolic flow  reversal noted in the abdominal aorta. No distinct vegetations visualized.  No dissection flap seen but minimal visualization of the ascending aorta.  Recommend TEE +/- CTA for further evaluation.   2. Left ventricular ejection fraction, by estimation, is 55 to 60%. The  left ventricle has normal function. The left ventricle has no regional  wall motion abnormalities. There is moderate concentric left ventricular  hypertrophy. Left ventricular  diastolic parameters are consistent with Grade I diastolic dysfunction  (impaired relaxation).   3. Right ventricular systolic function is normal. The right ventricular  size is normal.   4. Left atrial size was mildly dilated.   5. A small pericardial effusion is present. The pericardial effusion is  circumferential.   6. The mitral valve is normal in structure. Trivial mitral valve   regurgitation.   7. The inferior vena cava is dilated in size with <50% respiratory  variability, suggesting right atrial pressure of 15 mmHg.   8. Aortic dilatation noted. There is mild dilatation of the aortic root,  measuring 39 mm.   Patient Profile     31 y.o. male without significant medical history who is being seen 06/27/2021 for the evaluation of chest pain at the request of Dr. Kathrynn Humble.  Assessment & Plan    Chest Pain - Pericarditis vs endocarditis  - Patient presented complaining of left sided chest pain for 4-5 days. Sharp. Worse in certain positions. Not associated with exertion. Improved with tylenol. Associated with dry cough, chills and low grade fever in ED. He has been seen in the ED on 2/12, 2/13, 2/15 for similar complaints.  - HSTN have been negative at past ER visits. HSTN 122>>140>>114 this admission  - CRP 13.3, ESR 51 - CXR showed no acute cardiopulmonary disease  - Echo this admission showed severe aortic regurgitation, no distinct vegetations or aortic dissection. LVEF 55-60%, moderate LVH, grade I diastolic dysfunction.  - With new, severe AI, were concerned for aortic dissection. Initial chest CT angio showed borderline-mild cardiomegaly. Insufficient study to rule out aortic dissection. CT angio aorta showed no evidence of aortic aneurysm or aortic dissection.  - Concern for endocarditis  - TEE ordered  and scheduled for 2/20  - Blood cultures show NGTD (<24hrs since drawn)  - Started on Abx by primary team   Shared Decision Making/Informed Consent The risks [esophageal damage, perforation (1:10,000 risk), bleeding, pharyngeal hematoma as well as other potential complications associated with conscious sedation including aspiration, arrhythmia, respiratory failure and death], benefits (treatment guidance and diagnostic support) and alternatives of a transesophageal echocardiogram were discussed in detail with Mr. Dzikowski and he is willing to proceed.       For questions or updates, please contact [facility] Please consult [email or web address] for contact info under        Signed, Margie Billet, PA-C  06/28/2021, 9:05 AM    I have seen and examined the patient along with Margie Billet, PA-C .  I have reviewed the chart, notes and new data.  I agree with PA/NP's note.  Key new complaints: Has had fever and chills.  Still has some chest discomfort. Key examination changes: Murmur unchanged.  No clinical evidence of heart failure. Key new findings / data: CT angiogram without evidence of aortic dissection.  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  Unfortunately unable to get TEE today.  He is scheduled for Monday.  Agree with starting antibiotics, although it would have been nice to get more blood cultures before they were initiated.  Hopefully the blood cultures that were sent already will identify the offending organism enthesis no growth to date).  The only suspicious source would be a perianal skin infection that he had for a few weeks before these events occurred. Unfortunately, there is a significant probability that he will require aortic valve surgery, after completing intravenous antibiotics.  Sanda Klein, MD, [place] [phone] 06/28/2021, 10:14 AM

## 2021-06-28 NOTE — Progress Notes (Signed)
°  Transition of Care [facility]) Screening Note   Patient Details  Name: Martin Stafford Date of Birth: Jan 25, 1991   Transition of Care [facility]) CM/SW Contact:    Delilah Shan, LCSWA Phone Number: 06/28/2021, 4:55 PM    Transition of Care Department [facility]) has reviewed patient and no TOC needs have been identified at this time. We will continue to monitor patient advancement through interdisciplinary progression rounds. If new patient transition needs arise, please place a TOC consult.
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
## 2021-06-28 NOTE — Progress Notes (Signed)
Pharmacy Antibiotic Note  Martin Stafford is a 31 y.o. male admitted on 06/27/2021, now with concern for sepsis.  Pharmacy has been consulted for vancomycin and cefepime dosing.  Plan: Vancomycin 2000mg  x1 then 1500mg  IV Q12H. Goal AUC 400-550.  Expected AUC 450.  SCr 0.84.  Cefepime 2g IV Q8H.  Height: 5\' 11"  (180.3 cm) Weight: 109.5 kg (241 lb 8 oz) IBW/kg (Calculated) : 75.3  Temp (24hrs), Avg:98.8 F (37.1 C), Min:97.6 F (36.4 C), Max:101.9 F (38.8 C)  Recent Labs  Lab 06/23/21 0528 06/24/21 0903 06/27/21 0547 06/28/21 0233  WBC 8.5 9.3 10.8* 9.8  CREATININE 0.74 0.85 0.91 0.84    Estimated Creatinine Clearance: 164.8 mL/min (by C-G formula based on SCr of 0.84 mg/dL).    Allergies  Allergen Reactions   Eggs Or Egg-Derived Products     "eggs"   Peanut-Containing Drug Products     "peanuts"    Thank you for allowing pharmacy to be a part of this patients care.  06/26/21, PharmD, BCPS  06/28/2021 7:19 AM

## 2021-06-28 NOTE — Progress Notes (Signed)
Family Medicine Teaching Service Daily Progress Note Intern Pager: [phone]  Patient name: Martin Stafford Medical record number: [id number] Date of birth: 1991-02-08 Age: 31 y.o. Gender: male  Primary Care Provider: Patient, No Pcp Per (Inactive) Consultants: Cardiology Code Status: Full  Pt Overview and Major Events to Date:  2/16-admitted  Assessment and Plan: Patient is a 31 year old male presenting with nonradiating left-sided chest pain, chills, and sore throat with no significant past medical history  Chest pain   pericarditis versus endocarditis Patient has approximately week long history of chest pain, sore throat which was then accompanied by chills and likely fever.  Was seen in the ED 4 times before being admitted last night due to symptoms with slightly elevated troponin which began to trend downward (122> 140> 114> 109).  Atypical symptoms and EKG were reassuring to rule out MI.  Cardiology was consulted and ordered echocardiogram to assess for pericarditis versus myopericarditis in the setting of likely viral illness patient was started on colchicine and ibuprofen.    Echocardiogram showed LVEF of 123456, grade 1 diastolic dysfunction, small circumferential pericardial effusion, trivial mitral, pulmonic and tricuspid valve regurg, severe aortic regurgitation with diffuse color-flow in LVOT, with diastolic flow reversal in abdominal aorta.  CT angio was ordered which ruled out aortic dissection/aneurysm and TEE is tentatively scheduled for Monday per cardio.  Blood cultures were drawn yesterday and currently show no growth for less than 24 hours.  Patient had fever of 101.9 at 0621 this morning and was tachycardic to 1001.  BP increased to 144/70 this morning.  Due to possible endocarditis and fever this morning, patient has been empirically started on cefepime 2 g every 8 hours and vancomycin 2 g this morning followed by 1.5 g this evening and twice daily thereafter.  Unfortunately  he was started on these antibiotics before retrieving 2 more vials of blood for culture.  Unsure if there is any benefit to stopping antibiotics for 24 hours to then retrieve blood versus continuing antibiotics.  Will consult ID. -Cardiology following -Continue vancomycin and cefepime -Consult ID -Plan for TEE Monday -Continue colchicine and ibuprofen -Protonix 40 mg daily -Tylenol 650 mg every 6 hours as needed -Continuous cardiac monitoring -Follow-up blood cultures  Pilonidal cyst Patient has had history of pilonidal cyst since childhood.  States he had surgical removal of cyst when he was in high school.  States it has been draining a yellow pus for a long time maybe years.  He denies any pain.  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  Patient states they are not tender.  FEN/GI: Regular diet PPx: Lovenox Dispo: Pending clinical improvement  Subjective:  Patient had another episode of fever and chills this morning states he had shortness of breath and chest pain during the episode but currently denies shortness of breath and chest pain.  Objective: Temp:  [97.6 F (36.4 C)-98.6 F (37 C)] 97.6 F (36.4 C) (02/17 0016) Pulse Rate:  [69-91] 69 (02/17 0016) Resp:  [11-17] 14 (02/16 1521) BP: (114-150)/(48-77) 132/64 (02/17 0016) SpO2:  [94 %-100 %] 96 % (02/17 0016) Weight:  [109.5 kg] 109.5 kg (02/16 1521) Physical Exam: General: Lying comfortably in bed, NAD Cardiovascular: RRR, significant diastolic murmur Respiratory: CTA B, normal work of breathing Neck: multiple cyst like lesions, non tender to palpation, one draining with serosanguineous fluid Skin: Small area of drainage to the mid left top of the buttocks with area of  edema and erythema along right buttock cleft, nontender to palpation, nonindurated  Laboratory: Recent Labs  Lab 06/24/21 0903 06/27/21 0547 06/28/21 0233  WBC 9.3  10.8* 9.8  HGB 13.8 14.3 12.9*  HCT 40.4 44.1 38.7*  PLT 269 333 272   Recent Labs  Lab 06/23/21 0528 06/24/21 0903 06/27/21 0547 06/28/21 0233  NA 136 134* 134* 137  K 3.9 3.6 4.0 3.6  CL 105 102 98 102  CO2 22 24 25 26   BUN 14 12 11 11   CREATININE 0.74 0.85 0.91 0.84  CALCIUM 9.0 8.8* 9.2 8.8*  PROT 7.6  --  8.4*  --   BILITOT 0.4  --  0.7  --   ALKPHOS 40  --  50  --   ALT 19  --  24  --   AST 13*  --  25  --   GLUCOSE 106* 106* 110* 92     Precious Gilding, DO 06/28/2021, 5:31 AM PGY-1, [place] Intern pager: [phone], text pages welcome

## 2021-06-28 NOTE — Progress Notes (Signed)
°   06/28/21 0651  Assess: MEWS Score  Temp (!) 101.9 F (38.8 C)  Pulse Rate (!) 101  ECG Heart Rate (!) 103  SpO2 100 %  Assess: MEWS Score  MEWS Temp 2  MEWS Systolic 0  MEWS Pulse 1  MEWS RR 0  MEWS LOC 0  MEWS Score 3  MEWS Score Color Yellow  Assess: if the MEWS score is Yellow or Red  Were vital signs taken at a resting state? Yes  Focused Assessment Change from prior assessment (see assessment flowsheet)  Early Detection of Sepsis Score *See Row Information* Low  MEWS guidelines implemented *See Row Information* Yes  Take Vital Signs  Increase Vital Sign Frequency  Yellow: Q 2hr X 2 then Q 4hr X 2, if remains yellow, continue Q 4hrs  Escalate  MEWS: Escalate Yellow: discuss with charge nurse/RN and consider discussing with provider and RRT  Notify: Charge Nurse/RN  Name of Charge Nurse/RN Notified Jyron Turman, RN  Date Charge Nurse/RN Notified 06/28/21  Time Charge Nurse/RN Notified [id number]  Notify: Provider  Provider Name/Title Marisue Humble, Intern  Date Provider Notified 06/28/21  Time Provider Notified [phone]  Notification Type Page  Notification Reason Change in status  Document  Patient Outcome Not stable and remains on department  Progress note created (see row info) Yes

## 2021-06-28 NOTE — Progress Notes (Signed)
Pt had an episode of tremors, lower back pain, visible goosebumps, shortness of breath. O2 saturations remained above 95%, 12-lead EKG obtained, 2L [place] applied, [place], Night Intern made aware.   Elaina Hoops, RN

## 2021-06-29 DIAGNOSIS — L98411 Non-pressure chronic ulcer of buttock limited to breakdown of skin: Secondary | ICD-10-CM

## 2021-06-29 DIAGNOSIS — I351 Nonrheumatic aortic (valve) insufficiency: Secondary | ICD-10-CM | POA: Diagnosis not present

## 2021-06-29 DIAGNOSIS — R072 Precordial pain: Secondary | ICD-10-CM

## 2021-06-29 DIAGNOSIS — I358 Other nonrheumatic aortic valve disorders: Secondary | ICD-10-CM

## 2021-06-29 DIAGNOSIS — R778 Other specified abnormalities of plasma proteins: Secondary | ICD-10-CM | POA: Diagnosis not present

## 2021-06-29 LAB — CBC
HCT: 38.8 % — ABNORMAL LOW (ref 39.0–52.0)
Hemoglobin: 12.6 g/dL — ABNORMAL LOW (ref 13.0–17.0)
MCH: 26.4 pg (ref 26.0–34.0)
MCHC: 32.5 g/dL (ref 30.0–36.0)
MCV: 81.3 fL (ref 80.0–100.0)
Platelets: 284 10*3/uL (ref 150–400)
RBC: 4.77 MIL/uL (ref 4.22–5.81)
RDW: 14 % (ref 11.5–15.5)
WBC: 9 10*3/uL (ref 4.0–10.5)
nRBC: 0 % (ref 0.0–0.2)

## 2021-06-29 LAB — BASIC METABOLIC PANEL
Anion gap: 10 (ref 5–15)
BUN: 10 mg/dL (ref 6–20)
CO2: 23 mmol/L (ref 22–32)
Calcium: 8.5 mg/dL — ABNORMAL LOW (ref 8.9–10.3)
Chloride: 104 mmol/L (ref 98–111)
Creatinine, Ser: 0.68 mg/dL (ref 0.61–1.24)
GFR, Estimated: >60 mL/min (ref >60)
Glucose, Bld: 113 mg/dL — ABNORMAL HIGH (ref 70–99)
Potassium: 3.5 mmol/L (ref 3.5–5.1)
Sodium: 137 mmol/L (ref 135–145)

## 2021-06-29 NOTE — Progress Notes (Signed)
FPTS Brief Progress Note  S: Patient laying in bed when I came into the room. He states he was feeling well but wanted some Tylenol for mind throat discomfort. I let RN know   O: BP 132/65 (BP Location: Left Arm)    Pulse 92    Temp 98.5 F (36.9 C) (Oral)    Resp 18    Ht 5\' 11"  (1.803 m)    Wt 109.5 kg    SpO2 100%    BMI 33.68 kg/m    Physical exam General: well appearing, NAD Cardiovascular: RRR, no murmurs Lungs: CTAB. Normal WOB Abdomen: soft, non-distended, non-tender Skin: warm, dry. No edema  A/P: Chest pain   pericarditis versus endocarditis Currently being evaluated by cardiology and ID. TEE scheduled on Monday.  -Cardiology and ID following, appreciate continued care and recommendations - Follow-up blood cultures - Continue ibuprofen 600 mg 3 times daily - Continue colchicine 0.6 mg twice daily - Continue ceftriaxone 2 g daily - Continue vancomycin 1.5 g twice daily - Tylenol 650 mg every 6 hours as needed   - Orders reviewed. Labs for AM ordered, which was adjusted as needed.    Shary Key, DO 06/29/2021, 11:15 PM PGY-2, [place] Night Resident  Please page [phone] with questions.

## 2021-06-29 NOTE — Progress Notes (Signed)
Family Medicine Teaching Service Daily Progress Note Intern Pager: [phone]  Patient name: Martin Stafford Medical record number: [id number] Date of birth: 22-Mar-1991 Age: 31 y.o. Gender: male  Primary Care Provider: Patient, No Pcp Per (Inactive) Consultants: Cardiology, ID Code Status: full  Pt Overview and Major Events to Date:  2/16-admitted  Assessment and Plan: Patient is a 31 year old male presenting with nonradiating left-sided chest pain, chills, sore throat with no significant past medical history  Chest pain   pericarditis versus endocarditis Patient is currently being followed by cardiology and ID and being evaluated for pericarditis versus endocarditis.  Echo on 2/16 showed severe aortic regurg and pericardial effusion.  TEE is scheduled for Monday per cardio.  1 set of blood cultures were drawn in the ED on 2/16 currently with no growth.  Patient was empirically started vancomycin and cefepime yesterday morning when he fevered.  ID was consulted and de-escalated cefepime to ceftriaxone 2 g daily and ordered blood cultures x2 which were drawn yesterday.  Vital signs are stable and last fever was yesterday morning of 100.6.  WBC continues to trend down and is currently 9.  Patient continues to receive colchicine 0.6 mg twice daily and ibuprofen 600 mg 3 times daily per cardiology for suspected pericarditis. -Cardiology following -ID following -Continue ibuprofen 600 mg 3 times daily -Continue colchicine 0.6 mg twice daily -Continue ceftriaxone 2 g daily -Continue vancomycin 1.5 g twice daily -Tylenol 650 mg every 6 hours as needed -TEE Monday morning-2/20 -Follow-up blood cultures  Pilonidal cyst Patient has history of pilonidal cyst which was surgically removed when he was in high school.  It has been draining for several years.  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  Nontender per  patient.  FEN/GI: Regular diet PPx: Lovenox Dispo: Pending clinical improvement and results of TEE  Subjective:  Patient states he is feeling okay today, denies any chest pain or shortness of breath.  He has been able to eat and drink and had a bowel movement last night.  No questions or concerns today.  Objective: Temp:  [97.5 F (36.4 C)-100.6 F (38.1 C)] 97.5 F (36.4 C) (02/18 0545) Pulse Rate:  [80-86] 81 (02/18 0545) Resp:  [18] 18 (02/18 0545) BP: (119-160)/(58-88) 124/82 (02/18 0545) SpO2:  [97 %-98 %] 97 % (02/18 0545) Physical Exam: General: Patient lying in bed, watching television. Cardiovascular: RRR, normal [id number], diastolic murmur sounds slightly improved from yesterday Respiratory: CTAB, normal work of breathing Neuro: No focal deficits  Laboratory: Recent Labs  Lab 06/27/21 0547 06/28/21 0233 06/29/21 0227  WBC 10.8* 9.8 9.0  HGB 14.3 12.9* 12.6*  HCT 44.1 38.7* 38.8*  PLT 333 272 284   Recent Labs  Lab 06/23/21 0528 06/24/21 0903 06/27/21 0547 06/28/21 0233 06/29/21 0227  NA 136   < > 134* 137 137  K 3.9   < > 4.0 3.6 3.5  CL 105   < > 98 102 104  CO2 22   < > 25 26 23   BUN 14   < > 11 11 10   CREATININE 0.74   < > 0.91 0.84 0.68  CALCIUM 9.0   < > 9.2 8.8* 8.5*  PROT 7.6  --  8.4*  --   --   BILITOT 0.4  --  0.7  --   --   ALKPHOS 40  --  50  --   --   ALT 19  --  24  --   --   AST 13*  --  25  --   --   GLUCOSE 106*   < > 110* 92 113*   < > = values in this interval not displayed.       Precious Gilding, DO 06/29/2021, 7:51 AM PGY-1, [place] Intern pager: [phone], text pages welcome

## 2021-06-29 NOTE — Progress Notes (Signed)
Family Medicine Teaching Service Daily Progress Note Intern Pager: [phone]  Patient name: Martin Stafford Medical record number: [id number] Date of birth: June 27, 1990 Age: 31 y.o. Gender: male  Primary Care Provider: Patient, No Pcp Per (Inactive) Consultants: Cardiology, ID Code Status: Full   Pt Overview and Major Events to Date:  2/16 Admitted   Assessment and Plan: Patient is a 31 year old male presenting with nonradiating left-sided chest pain, chills, sore throat with no significant past medical history.  Chest pain   pericarditis versus endocarditis Currently being evaluated by cardiology and ID. TEE scheduled on Monday. WBC 9.4 this morning . Vitals stable, overnight did have some rigors but this morning feeling well and he has remained afebrile today.  -Cardiology and ID following, appreciate continued care and recommendations - Follow-up blood cultures - Continue ibuprofen 600 mg 3 times daily - Continue colchicine 0.6 mg twice daily - Continue ceftriaxone 2 g daily - Continue vancomycin 1.5 g twice daily - Tylenol 650 mg every 6 hours as needed  Pilonidal cyst  Previously surgically removed, has been draining for several years -Continue antibiotics   FEN/GI: Regular diet PPx: Lovenox   Disposition: Pending TEE   Subjective:  Overnight had some rigors but this morning feeling well. Has remained a febrile. No questions or concerns this morning   Objective: Temp:  [97.5 F (36.4 C)-98.5 F (36.9 C)] 98.5 F (36.9 C) (02/18 2039) Pulse Rate:  [80-102] 92 (02/18 2039) Resp:  [16-18] 18 (02/18 2039) BP: (124-156)/(65-82) 132/65 (02/18 2039) SpO2:  [97 %-100 %] 100 % (02/18 2039) Physical Exam: General: alert, laying in bed, NAD Cardiovascular: RRR  Respiratory: CTAB normal WOB    Abdomen: soft, non distended  Extremities: warm, dry no edema   Laboratory: Recent Labs  Lab 06/27/21 0547 06/28/21 0233 06/29/21 0227  WBC 10.8* 9.8 9.0  HGB 14.3 12.9*  12.6*  HCT 44.1 38.7* 38.8*  PLT 333 272 284   Recent Labs  Lab 06/23/21 0528 06/24/21 0903 06/27/21 0547 06/28/21 0233 06/29/21 0227  NA 136   < > 134* 137 137  K 3.9   < > 4.0 3.6 3.5  CL 105   < > 98 102 104  CO2 22   < > 25 26 23   BUN 14   < > 11 11 10   CREATININE 0.74   < > 0.91 0.84 0.68  CALCIUM 9.0   < > 9.2 8.8* 8.5*  PROT 7.6  --  8.4*  --   --   BILITOT 0.4  --  0.7  --   --   ALKPHOS 40  --  50  --   --   ALT 19  --  24  --   --   AST 13*  --  25  --   --   GLUCOSE 106*   < > 110* 92 113*   < > = values in this interval not displayed.    Imaging/Diagnostic Tests:  No new images   Shary Key, DO 06/29/2021, 10:50 PM PGY-2, [place] Intern pager: [phone], text pages welcome

## 2021-06-29 NOTE — Progress Notes (Signed)
° ° ° °   °  Dr Croitoru's note reviewed, plans for TEE on Monday. No additonal cardiology recs over the weekend, call with questions. Abx per primary team and ID   Signed, Dina Rich, MD  06/29/2021, 12:12 PM

## 2021-06-30 DIAGNOSIS — R778 Other specified abnormalities of plasma proteins: Secondary | ICD-10-CM | POA: Diagnosis not present

## 2021-06-30 DIAGNOSIS — I358 Other nonrheumatic aortic valve disorders: Secondary | ICD-10-CM | POA: Diagnosis not present

## 2021-06-30 DIAGNOSIS — R079 Chest pain, unspecified: Secondary | ICD-10-CM | POA: Diagnosis not present

## 2021-06-30 DIAGNOSIS — I351 Nonrheumatic aortic (valve) insufficiency: Secondary | ICD-10-CM | POA: Diagnosis not present

## 2021-06-30 LAB — BASIC METABOLIC PANEL
Anion gap: 9 (ref 5–15)
BUN: 12 mg/dL (ref 6–20)
CO2: 25 mmol/L (ref 22–32)
Calcium: 8.5 mg/dL — ABNORMAL LOW (ref 8.9–10.3)
Chloride: 103 mmol/L (ref 98–111)
Creatinine, Ser: 0.85 mg/dL (ref 0.61–1.24)
GFR, Estimated: >60 mL/min (ref >60)
Glucose, Bld: 105 mg/dL — ABNORMAL HIGH (ref 70–99)
Potassium: 3.5 mmol/L (ref 3.5–5.1)
Sodium: 137 mmol/L (ref 135–145)

## 2021-06-30 LAB — CBC
HCT: 36.2 % — ABNORMAL LOW (ref 39.0–52.0)
Hemoglobin: 12 g/dL — ABNORMAL LOW (ref 13.0–17.0)
MCH: 26.5 pg (ref 26.0–34.0)
MCHC: 33.1 g/dL (ref 30.0–36.0)
MCV: 80.1 fL (ref 80.0–100.0)
Platelets: 283 10*3/uL (ref 150–400)
RBC: 4.52 MIL/uL (ref 4.22–5.81)
RDW: 13.9 % (ref 11.5–15.5)
WBC: 9.4 10*3/uL (ref 4.0–10.5)
nRBC: 0 % (ref 0.0–0.2)

## 2021-06-30 MED ORDER — LIP MEDEX EX OINT
TOPICAL_OINTMENT | CUTANEOUS | Status: DC | PRN
  Filled 2021-06-30: qty 7

## 2021-06-30 NOTE — Progress Notes (Addendum)
FPTS Brief Progress Note  S: Patient resting and watching television.  Denies any pain at this time, no concerns.   O: BP 138/66 (BP Location: Left Arm)    Pulse 96    Temp 99.1 F (37.3 C) (Oral)    Resp 17    Ht 5\' 11"  (1.803 m)    Wt 109.5 kg    SpO2 97%    BMI 33.68 kg/m   General: lying in bed, NAD Respiratory: breathing comfortably on room air  A/P: Pericarditis and/or possibly endocarditis -continue abx -continue colchicine and ibuprofen  -Plan for TEE tomorrow -Continue to follow plan outlined in day teams progress note - Orders reviewed. Labs for AM ordered, which was adjusted as needed.    Precious Gilding, DO 06/30/2021, 11:30 PM PGY-1, [place] [facility] Night Resident  Please page [phone] with questions.

## 2021-06-30 NOTE — Progress Notes (Signed)
Patient had episode of rigors, 2 warm blankets placed on patient, Temp=98.2.  scheduled advil given.    Patient states it lasts 10 minutes and feels as if its getting better.

## 2021-06-30 NOTE — Progress Notes (Signed)
Pt called for 3 blankets, upon entering room, noted patient to have rigors. Blankets and heat packs applied per patient request.  Temp 97.4.  Episode lasted approx 10 minutes, pt verbalized they usually last this duration and occur once during day and once during the night. Dr. Arby Barrette notified.  Will continue to monitor closely.

## 2021-07-01 ENCOUNTER — Encounter (HOSPITAL_COMMUNITY)
Admission: EM | Disposition: A | Discharge status: Home Health | Payer: Self-pay | Source: Home / Self Care | Attending: Cardiothoracic Surgery

## 2021-07-01 ENCOUNTER — Inpatient Hospital Stay (HOSPITAL_COMMUNITY): Payer: BC Managed Care – PPO | Admitting: Certified Registered"

## 2021-07-01 ENCOUNTER — Inpatient Hospital Stay (HOSPITAL_COMMUNITY): Payer: BC Managed Care – PPO

## 2021-07-01 ENCOUNTER — Encounter (HOSPITAL_COMMUNITY): Payer: Self-pay | Admitting: Family Medicine

## 2021-07-01 DIAGNOSIS — I083 Combined rheumatic disorders of mitral, aortic and tricuspid valves: Secondary | ICD-10-CM | POA: Diagnosis not present

## 2021-07-01 DIAGNOSIS — R079 Chest pain, unspecified: Secondary | ICD-10-CM | POA: Diagnosis not present

## 2021-07-01 DIAGNOSIS — D649 Anemia, unspecified: Secondary | ICD-10-CM | POA: Diagnosis not present

## 2021-07-01 DIAGNOSIS — Z01818 Encounter for other preprocedural examination: Secondary | ICD-10-CM | POA: Diagnosis not present

## 2021-07-01 DIAGNOSIS — I358 Other nonrheumatic aortic valve disorders: Secondary | ICD-10-CM | POA: Diagnosis not present

## 2021-07-01 DIAGNOSIS — L98411 Non-pressure chronic ulcer of buttock limited to breakdown of skin: Secondary | ICD-10-CM | POA: Diagnosis not present

## 2021-07-01 DIAGNOSIS — I351 Nonrheumatic aortic (valve) insufficiency: Secondary | ICD-10-CM | POA: Diagnosis not present

## 2021-07-01 DIAGNOSIS — I3139 Other pericardial effusion (noninflammatory): Secondary | ICD-10-CM | POA: Diagnosis not present

## 2021-07-01 DIAGNOSIS — Z8679 Personal history of other diseases of the circulatory system: Secondary | ICD-10-CM | POA: Diagnosis not present

## 2021-07-01 DIAGNOSIS — R509 Fever, unspecified: Secondary | ICD-10-CM | POA: Diagnosis not present

## 2021-07-01 DIAGNOSIS — R778 Other specified abnormalities of plasma proteins: Secondary | ICD-10-CM | POA: Diagnosis not present

## 2021-07-01 DIAGNOSIS — L732 Hidradenitis suppurativa: Secondary | ICD-10-CM | POA: Diagnosis not present

## 2021-07-01 HISTORY — PX: TEE WITHOUT CARDIOVERSION: SHX5443

## 2021-07-01 LAB — CBC
HCT: 36.8 % — ABNORMAL LOW (ref 39.0–52.0)
Hemoglobin: 11.9 g/dL — ABNORMAL LOW (ref 13.0–17.0)
MCH: 25.6 pg — ABNORMAL LOW (ref 26.0–34.0)
MCHC: 32.3 g/dL (ref 30.0–36.0)
MCV: 79.3 fL — ABNORMAL LOW (ref 80.0–100.0)
Platelets: 304 10*3/uL (ref 150–400)
RBC: 4.64 MIL/uL (ref 4.22–5.81)
RDW: 14 % (ref 11.5–15.5)
WBC: 9.9 10*3/uL (ref 4.0–10.5)
nRBC: 0 % (ref 0.0–0.2)

## 2021-07-01 LAB — BASIC METABOLIC PANEL
Anion gap: 9 (ref 5–15)
BUN: 10 mg/dL (ref 6–20)
CO2: 25 mmol/L (ref 22–32)
Calcium: 8.5 mg/dL — ABNORMAL LOW (ref 8.9–10.3)
Chloride: 103 mmol/L (ref 98–111)
Creatinine, Ser: 0.82 mg/dL (ref 0.61–1.24)
GFR, Estimated: >60 mL/min (ref >60)
Glucose, Bld: 99 mg/dL (ref 70–99)
Potassium: 3.3 mmol/L — ABNORMAL LOW (ref 3.5–5.1)
Sodium: 137 mmol/L (ref 135–145)

## 2021-07-01 LAB — URINALYSIS, ROUTINE W REFLEX MICROSCOPIC
Bilirubin Urine: NEGATIVE
Glucose, UA: NEGATIVE mg/dL
Hgb urine dipstick: NEGATIVE
Ketones, ur: 5 mg/dL — AB
Leukocytes,Ua: NEGATIVE
Nitrite: NEGATIVE
Protein, ur: NEGATIVE mg/dL
Specific Gravity, Urine: 1.016 (ref 1.005–1.030)
pH: 5 (ref 5.0–8.0)

## 2021-07-01 LAB — SURGICAL PCR SCREEN
MRSA, PCR: NEGATIVE
Staphylococcus aureus: NEGATIVE

## 2021-07-01 SURGERY — ECHOCARDIOGRAM, TRANSESOPHAGEAL
Anesthesia: Monitor Anesthesia Care

## 2021-07-01 MED ORDER — POTASSIUM CHLORIDE CRYS ER 20 MEQ PO TBCR
40.0000 meq | EXTENDED_RELEASE_TABLET | Freq: Once | ORAL | Status: AC
  Administered 2021-07-01: 40 meq via ORAL
  Filled 2021-07-01: qty 2

## 2021-07-01 MED ORDER — PROPOFOL 500 MG/50ML IV EMUL
INTRAVENOUS | Status: DC | PRN
  Administered 2021-07-01: 100 ug/kg/min via INTRAVENOUS

## 2021-07-01 MED ORDER — LIDOCAINE 2% (20 MG/ML) 5 ML SYRINGE
INTRAMUSCULAR | Status: DC | PRN
  Administered 2021-07-01: 60 mg via INTRAVENOUS

## 2021-07-01 MED ORDER — POTASSIUM CHLORIDE 10 MEQ/100ML IV SOLN: 10.0000 meq | INTRAVENOUS | Status: DC

## 2021-07-01 MED ORDER — PROPOFOL 10 MG/ML IV BOLUS
INTRAVENOUS | Status: DC | PRN
  Administered 2021-07-01: 30 mg via INTRAVENOUS
  Administered 2021-07-01: 20 mg via INTRAVENOUS
  Administered 2021-07-01: 40 mg via INTRAVENOUS
  Administered 2021-07-01: 25 mg via INTRAVENOUS

## 2021-07-01 MED ORDER — CHLORHEXIDINE GLUCONATE 4 % EX LIQD
Freq: Every day | CUTANEOUS | Status: DC
  Administered 2021-07-02: 1 via TOPICAL
  Filled 2021-07-01: qty 15

## 2021-07-01 NOTE — Progress Notes (Signed)
°  Echocardiogram Echocardiogram Transesophageal has been performed.  Martin Stafford 07/01/2021, 2:28 PM

## 2021-07-01 NOTE — Progress Notes (Signed)
Family Medicine Teaching Service Daily Progress Note Intern Pager: [phone]  Patient name: Martin Stafford Medical record number: [id number] Date of birth: 01-01-1991 Age: 31 y.o. Gender: male  Primary Care Provider: Patient, No Pcp Per (Inactive) Consultants: Cardiology, ID Code Status: Full  Pt Overview and Major Events to Date:  2/16- admitted  Assessment and Plan: Patient is a 31 year old male presenting with nonradiating left-sided chest pain, chills, sore throat with no significant past medical history.  Chest Pain   Pericarditis vs Endocarditis Pending TEE scheduled for later today. WBC stable at 9.9. Vitals remained stable overnight. Patient reports last episode of rigors around 1630 yesterday. Blood cultures from 2/16 and 17 remain with NGTD. K 3.3 today.  - Cardiology and ID are following, appreciate their assistance - Continue to follow blood cultures - Ibuprofen 600mg  TID - Colchicine 0.6mg  BID - Continue vancomycin and ceftriaxone - Tylenol 650mg  q6h PRN - 40 mEq Kcl ordered   Pilonidal cyst S/p remote surgery but with ongoing drainage. - Should be covered by antibiotics as above  FEN/GI: N.p.o. ahead of TEE, can resume regular diet afterwards PPx: Lovenox Dispo: Pending results of TEE  .   Subjective:  Patient reports feeling generally well this morning.  He last had an episode of rigors around 430 yesterday afternoon.  He is eagerly anticipating his TEE this afternoon.  No chest pain or shortness of breath today.  Objective: Temp:  [98.3 F (36.8 C)-99.1 F (37.3 C)] 99.1 F (37.3 C) (02/19 2108) Pulse Rate:  [79-96] 96 (02/19 2108) Resp:  [15-20] 17 (02/19 2108) BP: (104-143)/(54-79) 138/66 (02/19 2108) SpO2:  [97 %-100 %] 97 % (02/19 2108) Physical Exam: General: Well nourished, NAD Cardiovascular: Regular rate, regular rhythm, heart sounds somewhat muted, soft systolic and diastolic murmur Respiratory: Lungs clear to auscultation bilaterally, normal  work of breathing on room air Abdomen: Soft, nontender, nondistended Extremities: Without edema or deformity  Laboratory: Recent Labs  Lab 06/29/21 0227 06/30/21 0253 07/01/21 0132  WBC 9.0 9.4 9.9  HGB 12.6* 12.0* 11.9*  HCT 38.8* 36.2* 36.8*  PLT 284 283 304   Recent Labs  Lab 06/27/21 0547 06/28/21 0233 06/29/21 0227 06/30/21 0253 07/01/21 0132  NA 134*   < > 137 137 137  K 4.0   < > 3.5 3.5 3.3*  CL 98   < > 104 103 103  CO2 25   < > 23 25 25   BUN 11   < > 10 12 10   CREATININE 0.91   < > 0.68 0.85 0.82  CALCIUM 9.2   < > 8.5* 8.5* 8.5*  PROT 8.4*  --   --   --   --   BILITOT 0.7  --   --   --   --   ALKPHOS 50  --   --   --   --   ALT 24  --   --   --   --   AST 25  --   --   --   --   GLUCOSE 110*   < > 113* 105* 99   < > = values in this interval not displayed.    Imaging/Diagnostic Tests: ECHOCARDIOGRAM COMPLETE    ECHOCARDIOGRAM REPORT       Patient Name:   Martin Stafford Date of Exam: 06/27/2021 Medical Rec #:  [id number]         Height:       71.0 in Accession #:    [id number]  Weight:       235.0 lb Date of Birth:  1990-09-07         BSA:          2.258 m Patient Age:    30 years          BP:           114/48 mmHg Patient Gender: M                 HR:           88 bpm. Exam Location:  Inpatient  Procedure: 2D Echo  Indications:     Chest pain   History:         Patient has no prior history of Echocardiogram examinations.   Sonographer:     Eduard Roux Referring Phys:  [id number] Jonita Albee Diagnosing Phys: Laurance Flatten MD  IMPRESSIONS   1. The aortic valve leafets are not well visualized but the valve appears tricuspid with no significant calcification/thickening. There is very eccentric, severe aortic regurgitation with diffuse color flow seen in the LVOT. There is diastolic flow  reversal noted in the abdominal aorta. No distinct vegetations visualized. No dissection flap seen but minimal visualization of the  ascending aorta. Recommend TEE +/- CTA for further evaluation.  2. Possible perimembranous VSD visualized. Recommend TEE as above for further evaluation.  3. Left ventricular ejection fraction, by estimation, is 55 to 60%. The left ventricle has normal function. The left ventricle has no regional wall motion abnormalities. There is moderate concentric left ventricular hypertrophy. Left ventricular  diastolic parameters are consistent with Grade I diastolic dysfunction (impaired relaxation).  4. Right ventricular systolic function is normal. The right ventricular size is normal.  5. Left atrial size was mildly dilated.  6. A small pericardial effusion is present. The pericardial effusion is circumferential.  7. The mitral valve is normal in structure. Trivial mitral valve regurgitation.  8. The inferior vena cava is dilated in size with <50% respiratory variability, suggesting right atrial pressure of 15 mmHg.  9. Aortic dilatation noted. There is mild dilatation of the aortic root, measuring 39 mm.  Comparison(s): No prior Echocardiogram.  FINDINGS  Left Ventricle: Left ventricular ejection fraction, by estimation, is 55 to 60%. The left ventricle has normal function. The left ventricle has no regional wall motion abnormalities. The left ventricular internal cavity size was normal in size. There is  moderate concentric left ventricular hypertrophy. Left ventricular diastolic parameters are consistent with Grade I diastolic dysfunction (impaired relaxation).  Right Ventricle: The right ventricular size is normal. No increase in right ventricular wall thickness. Right ventricular systolic function is normal.  Left Atrium: Left atrial size was mildly dilated.  Right Atrium: Right atrial size was normal in size.  Pericardium: A small pericardial effusion is present. The pericardial effusion is circumferential.  Mitral Valve: The mitral valve is normal in structure. Trivial mitral valve  regurgitation.  Tricuspid Valve: The tricuspid valve is normal in structure. Tricuspid valve regurgitation is trivial.  Aortic Valve: The aortic valve leafets are not well visualized but the valve appears tricuspid with minimal calcification/thickening. There is very eccentric, severe aortic regurgitation with diffuse color flow seen in the LVOT. There is diastolic flow  reversal noted in the abdominal aorta. Recommend TEE for further evaluation. The aortic valve was not well visualized. Aortic valve regurgitation is severe. No aortic stenosis is present. Aortic valve peak gradient measures 16.0 mmHg.  Pulmonic Valve: The pulmonic valve was normal  in structure. Pulmonic valve regurgitation is trivial.  Aorta: Aortic dilatation noted. There is mild dilatation of the aortic root, measuring 39 mm.  Venous: The inferior vena cava is dilated in size with less than 50% respiratory variability, suggesting right atrial pressure of 15 mmHg.  IAS/Shunts: The atrial septum is grossly normal.    LEFT VENTRICLE PLAX 2D LVIDd:         5.60 cm LVIDs:         3.80 cm LV PW:         1.65 cm LV IVS:        1.60 cm LVOT diam:     3.40 cm LV SV:         270 LV SV Index:   119 LVOT Area:     9.08 cm    RIGHT VENTRICLE             IVC RV Basal diam:  3.10 cm     IVC diam: 2.30 cm RV S prime:     15.50 cm/s TAPSE (M-mode): 2.6 cm  LEFT ATRIUM             Index        RIGHT ATRIUM           Index LA diam:        4.00 cm 1.77 cm/m   RA Area:     17.60 cm LA Vol (A2C):   84.2 ml 37.29 ml/m  RA Volume:   46.80 ml  20.72 ml/m LA Vol (A4C):   72.0 ml 31.88 ml/m LA Biplane Vol: 80.7 ml 35.74 ml/m  AORTIC VALVE                 PULMONIC VALVE AV Area (Vmax): 6.63 cm     PV Vmax:       1.09 m/s AV Vmax:        200.00 cm/s  PV Peak grad:  4.8 mmHg AV Peak Grad:   16.0 mmHg LVOT Vmax:      146.00 cm/s LVOT Vmean:     86.400 cm/s LVOT VTI:       0.297 m   AORTA Ao Asc diam: 3.10 cm  MITRAL  VALVE MV Area (PHT): 4.39 cm     SHUNTS MV Decel Time: 173 msec     Systemic VTI:  0.30 m MV E velocity: 82.80 cm/s   Systemic Diam: 3.40 cm MV A velocity: 106.00 cm/s MV E/A ratio:  0.78  Gwyndolyn Kaufman MD Electronically signed by Gwyndolyn Kaufman MD Signature Date/Time: 06/27/2021/4:10:46 PM      Final (Updated)      Eppie Gibson, MD 07/01/2021, 5:48 AM PGY-1, [place] Intern pager: [phone], text pages welcome

## 2021-07-01 NOTE — Hospital Course (Addendum)
Patient is a 31 year old male presenting with nonradiating left-sided chest pain, chills, and sore throat with no significant past medical history.  Patient had approximately week long history of chest pain, sore throat which was then accompanied by chills and likely fever.  Was seen in the ED 4 times before being admitted  due to symptoms with slightly elevated troponin which began to trend downward (122> 140> 114> 109).  Atypical symptoms and EKG were reassuring to rule out MI.  Cardiology was consulted and ordered echocardiogram to assess for pericarditis in the setting of likely viral illness patient was started on colchicine and ibuprofen.    Echocardiogram showed LVEF of [id number], grade 1 diastolic dysfunction, small circumferential pericardial effusion, trivial mitral, pulmonic and tricuspid valve regurg, severe aortic regurgitation with diffuse color-flow in LVOT, with diastolic flow reversal in abdominal aorta.  CT angio was ordered which ruled out aortic dissection/aneurysm. Blood cx obtained on 2/16 and 2/17 showed no growth.  With a diagnosis of endocarditis  patient was empirically started on cefepime 2 g TID and vancomycin 2 g once followed by 1.5 g BID. ID consulted and cefepime switched to ceftriaxone 2g daily. TEE on 07/01/21 confirmed severe aortic insufficiency with significant flow reversal in the thoracic aorta.  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  This was evaluated by general surgery.  Given the need for eventual aortic valve replacement, resection of the draining tract was recommended.  This was carried out by Dr. Ninfa Linden on 07/03/2021.  A negative pressure dressing was placed over the site at the conclusion of the procedure.  The patient was returned to the surgical floor.  IV antibiotics continued.  He remained stable  throughout this procedure and during the early recovery phase.  On 07/05/2021, he was taken to the operating room by Dr. Prescott Gum for aortic valve replacement utilizing a homograft.    Postoperative hospital course:  Patient was extubated on the afternoon of postop day #1.  He has been kept on vancomycin and ceftriaxone per ID recommendations. The final intraoperative culture report report showed no growth.  He has some expected postoperative volume overload and is responding well to diuretics.  He has an expected postoperative acute blood loss anemia and values have stabilized.  He has required transfusion of both packed cells as well as multiple products.  Since the, his Hgb and plt counts have been stable. Renal function has remained within normal limits. Diet and activity were advanced and well tolerated.  The negative pressure dressing to the pilonidal cyst resection was removed on 2/28 and the site was intact whith some draining as expected per general surgery progress notes.  By 07/10/21, he was ready for transfer to [facility]. He continued to progress and resumed independent mobility. He remained in a stable sinus rhythm.  The infectious disease consultants recommended continuation of Daptomycin and ceftriaxone for 6 weeks (through 08/13/21).  A PICC line was placed and home health nursing arranged so this could be accomplished in the outpatient setting.

## 2021-07-01 NOTE — Progress Notes (Signed)
Day of Surgery Procedure(s) (LRB): TRANSESOPHAGEAL ECHOCARDIOGRAM (TEE) (N/A) Patient examined, images of TEE and chest CTA personally reviewed and discussed with patient and family. The patient has  aortic valve endocarditis and will need valve replacement, possible root replacement.  The source of the infection is not clear.  The patient has chronic draining skin lesions in his sacral area as well as his posterior neck.  He also has been complaining about a left-sided lower tooth "wisdom tooth" that could be the source of the endocarditis.  An orthopantogram has been completed and we will ask for a dental consult.  Prior to surgery the patient will also need an imaging study to clear the coronary vessels-he has a strong family history of coronary disease.  Cardiac CT scan for coronary imaging would probably be sufficient.  Patient will need to start using CHG wipes daily on the areas of the hidradenitis prior to surgery.  He will also need a drug screen.  Objective: Vital signs in last 24 hours: Temp:  [97.1 F (36.2 C)-99.1 F (37.3 C)] 98.1 F (36.7 C) (02/20 1543) Pulse Rate:  [74-106] 96 (02/20 1543) Cardiac Rhythm: Normal sinus rhythm (02/20 0829) Resp:  [15-20] 16 (02/20 1543) BP: (103-155)/(42-84) 143/84 (02/20 1543) SpO2:  [90 %-98 %] 90 % (02/20 1543) Weight:  [109.5 kg] 109.5 kg (02/20 1220)  Hemodynamic parameters for last 24 hours: Afebrile sinus rhythm  Intake/Output from previous day: 02/19 0701 - 02/20 0700 In: 840 [P.O.:540; IV Piggyback:300] Out: 900 [Urine:900] Intake/Output this shift: Total I/O In: -  Out: 33 [Urine:200]  General-overweight young male no acute distress Lungs-clear Cardiac-systolic murmur at the right upper sternal border and diastolic murmur at the left axilla Dental-no visible areas of infection Neuro-intact Lab Results: Recent Labs    06/30/21 0253 07/01/21 0132  WBC 9.4 9.9  HGB 12.0* 11.9*  HCT 36.2* 36.8*  PLT 283 304    BMET:  Recent Labs    06/30/21 0253 07/01/21 0132  NA 137 137  K 3.5 3.3*  CL 103 103  CO2 25 25  GLUCOSE 105* 99  BUN 12 10  CREATININE 0.85 0.82  CALCIUM 8.5* 8.5*    PT/INR: No results for input(s): LABPROT, INR in the last 72 hours. ABG No results found for: PHART, HCO3, TCO2, ACIDBASEDEF, O2SAT CBG (last 3)  No results for input(s): GLUCAP in the last 72 hours.  Assessment/Plan: S/P Procedure(s) (LRB): TRANSESOPHAGEAL ECHOCARDIOGRAM (TEE) (N/A) Plan surgery later this week pending results of dental evaluation and recommendation if extractions needed. Continue broad-spectrum IV antibiotics  LOS: 4 days    Dahlia Byes 07/01/2021

## 2021-07-01 NOTE — Progress Notes (Signed)
Progress Note  Patient Name: Martin Stafford Date of Encounter: 07/01/2021  [facility] Cardiologist: Sanda Klein, MD   Subjective   No chest pain, no SOB  Inpatient Medications    Scheduled Meds:  colchicine  0.6 mg Oral BID   enoxaparin (LOVENOX) injection  40 mg Subcutaneous Q24H   ibuprofen  600 mg Oral TID   pantoprazole  40 mg Oral Daily   Continuous Infusions:  sodium chloride 20 mL/hr at 06/30/21 0826   cefTRIAXone (ROCEPHIN)  IV 2 g (06/30/21 1812)   vancomycin 1,500 mg (06/30/21 2034)   PRN Meds: acetaminophen **OR** acetaminophen, lip balm   Vital Signs    Vitals:   06/30/21 1202 06/30/21 1637 06/30/21 2108 07/01/21 0651  BP: 104/62 (!) 143/79 138/66 (!) 103/43  Pulse: 79  96 74  Resp: 20 18 17 18   Temp: 98.3 F (36.8 C) 98.4 F (36.9 C) 99.1 F (37.3 C) 97.6 F (36.4 C)  TempSrc: Oral Oral Oral Oral  SpO2: 98% 100% 97% 91%  Weight:      Height:        Intake/Output Summary (Last 24 hours) at 07/01/2021 0748 Last data filed at 07/01/2021 0600 Gross per 24 hour  Intake 840 ml  Output 900 ml  Net -60 ml   Last 3 Weights 06/27/2021 06/26/2021 06/24/2021  Weight (lbs) 241 lb 8 oz 235 lb 235 lb  Weight (kg) 109.544 kg 106.595 kg 106.595 kg      Telemetry    SR to ST with PACs and SVT - Personally Reviewed  ECG    No new - Personally Reviewed  Physical Exam   GEN: No acute distress.   Neck: No JVD Cardiac: RRR, 2/6 murmur, no rubs, or gallops.  Respiratory: Clear to auscultation bilaterally. GI: Soft, nontender, non-distended  MS: No edema; No deformity. Neuro:  Nonfocal  Psych: Normal affect   Labs    High Sensitivity Troponin:   Recent Labs  Lab 06/24/21 0903 06/27/21 0547 06/27/21 0740 06/27/21 1237 06/27/21 1549  TROPONINIHS 4 122* 140* 114* 109*     Chemistry Recent Labs  Lab 06/27/21 0547 06/28/21 0233 06/29/21 0227 06/30/21 0253 07/01/21 0132  NA 134*   < > 137 137 137  K 4.0   < > 3.5 3.5 3.3*  CL 98    < > 104 103 103  CO2 25   < > 23 25 25   GLUCOSE 110*   < > 113* 105* 99  BUN 11   < > 10 12 10   CREATININE 0.91   < > 0.68 0.85 0.82  CALCIUM 9.2   < > 8.5* 8.5* 8.5*  PROT 8.4*  --   --   --   --   ALBUMIN 3.8  --   --   --   --   AST 25  --   --   --   --   ALT 24  --   --   --   --   ALKPHOS 50  --   --   --   --   BILITOT 0.7  --   --   --   --   GFRNONAA >60   < > >60 >60 >60  ANIONGAP 11   < > 10 9 9    < > = values in this interval not displayed.    Lipids No results for input(s): CHOL, TRIG, HDL, LABVLDL, LDLCALC, CHOLHDL in the last 168 hours.  Hematology Recent Labs  Lab 06/29/21 0227 06/30/21 0253 07/01/21 0132  WBC 9.0 9.4 9.9  RBC 4.77 4.52 4.64  HGB 12.6* 12.0* 11.9*  HCT 38.8* 36.2* 36.8*  MCV 81.3 80.1 79.3*  MCH 26.4 26.5 25.6*  MCHC 32.5 33.1 32.3  RDW 14.0 13.9 14.0  PLT 284 283 304   Thyroid No results for input(s): TSH, FREET4 in the last 168 hours.  BNPNo results for input(s): BNP, PROBNP in the last 168 hours.  DDimer No results for input(s): DDIMER in the last 168 hours.   Radiology    No results found.  Cardiac Studies   Echocardiogram 06/27/2021    1. The aortic valve leafets are not well visualized but the valve appears  tricuspid with no significant calcification/thickening. There is very  eccentric, severe aortic regurgitation with diffuse color flow seen in the  LVOT. There is diastolic flow  reversal noted in the abdominal aorta. No distinct vegetations visualized.  No dissection flap seen but minimal visualization of the ascending aorta.  Recommend TEE +/- CTA for further evaluation.   2. Left ventricular ejection fraction, by estimation, is 55 to 60%. The  left ventricle has normal function. The left ventricle has no regional  wall motion abnormalities. There is moderate concentric left ventricular  hypertrophy. Left ventricular  diastolic parameters are consistent with Grade I diastolic dysfunction  (impaired relaxation).   3.  Right ventricular systolic function is normal. The right ventricular  size is normal.   4. Left atrial size was mildly dilated.   5. A small pericardial effusion is present. The pericardial effusion is  circumferential.   6. The mitral valve is normal in structure. Trivial mitral valve  regurgitation.   7. The inferior vena cava is dilated in size with <50% respiratory  variability, suggesting right atrial pressure of 15 mmHg.   8. Aortic dilatation noted. There is mild dilatation of the aortic root,  measuring 39 mm.     Patient Profile     31 y.o. male without significant medical history, admitted with chest pain 06/27/21 found to have severe AR EF 55-60% mod LVH.     Assessment & Plan    Chest Pain - Pericarditis vs endocarditis  - Patient presented complaining of left sided chest pain for 4-5 days. Sharp. Worse in certain positions. Not associated with exertion. Improved with tylenol. Associated with dry cough, chills and low grade fever in ED. He has been seen in the ED on 2/12, 2/13, 2/15 for similar complaints.  - HSTN have been negative at past ER visits. HsTN 122>>140>>114>>109 this admission  - CRP 13.3, ESR 51 - CXR showed no acute cardiopulmonary disease  - Echo this admission showed severe aortic regurgitation, no distinct vegetations or aortic dissection. LVEF 55-60%, moderate LVH, grade I diastolic dysfunction.  - With new, severe AI, were concerned for aortic dissection. Initial chest CT angio showed borderline-mild cardiomegaly. Insufficient study to rule out aortic dissection. CT angio aorta showed no evidence of aortic aneurysm or aortic dissection.  - Concern for endocarditis  - TEE ordered and scheduled for today  - Blood cultures show NGTD (at 3 days since drawn)  - Started on Abx by primary team  -hypokalemic today at 3.3  replaced with 40 meq KCL     For questions or updates, please contact [facility] Please consult [email or web address] for contact info under         Signed, Cecilie Kicks, NP  07/01/2021, 7:48 AM

## 2021-07-01 NOTE — Anesthesia Procedure Notes (Signed)
Procedure Name: MAC Date/Time: 07/01/2021 1:43 PM Performed by: Moshe Salisbury, CRNA Pre-anesthesia Checklist: Patient identified, Emergency Drugs available, Suction available and Patient being monitored Patient Re-evaluated:Patient Re-evaluated prior to induction Oxygen Delivery Method: Nasal cannula Induction Type: IV induction Airway Equipment and Method: Bite block Placement Confirmation: positive ETCO2 Dental Injury: Teeth and Oropharynx as per pre-operative assessment

## 2021-07-01 NOTE — Interval H&P Note (Signed)
History and Physical Interval Note:  07/01/2021 1:30 PM  Martin Stafford  has presented today for surgery, with the diagnosis of AORTIC INSUFFICIENCY.  The various methods of treatment have been discussed with the patient and family. After consideration of risks, benefits and other options for treatment, the patient has consented to  Procedure(s): TRANSESOPHAGEAL ECHOCARDIOGRAM (TEE) (N/A) as a surgical intervention.  The patient's history has been reviewed, patient examined, no change in status, stable for surgery.  I have reviewed the patient's chart and labs.  Questions were answered to the patient's satisfaction.     Dietrich Pates

## 2021-07-01 NOTE — Anesthesia Preprocedure Evaluation (Addendum)
Anesthesia Evaluation  Patient identified by MRN, date of birth, ID band Patient awake    Reviewed: Allergy & Precautions, NPO status , Patient's Chart, lab work & pertinent test results  Airway Mallampati: III  TM Distance: >3 FB Neck ROM: Full    Dental no notable dental hx.    Pulmonary neg pulmonary ROS,    Pulmonary exam normal        Cardiovascular Normal cardiovascular exam+ Valvular Problems/Murmurs AI      Neuro/Psych negative neurological ROS     GI/Hepatic negative GI ROS, Neg liver ROS,   Endo/Other  negative endocrine ROS  Renal/GU negative Renal ROS     Musculoskeletal negative musculoskeletal ROS (+)   Abdominal (+) + obese,   Peds  Hematology  (+) Blood dyscrasia, anemia ,   Anesthesia Other Findings AORTIC INSUFFICIENCY  Reproductive/Obstetrics                            Anesthesia Physical Anesthesia Plan  ASA: 2  Anesthesia Plan: MAC   Post-op Pain Management:    Induction: Intravenous  PONV Risk Score and Plan: 1 and Propofol infusion and Treatment may vary due to age or medical condition  Airway Management Planned: Nasal Cannula  Additional Equipment:   Intra-op Plan:   Post-operative Plan:   Informed Consent: I have reviewed the patients History and Physical, chart, labs and discussed the procedure including the risks, benefits and alternatives for the proposed anesthesia with the patient or authorized representative who has indicated his/her understanding and acceptance.     Dental advisory given  Plan Discussed with: CRNA  Anesthesia Plan Comments:        Anesthesia Quick Evaluation

## 2021-07-01 NOTE — CV Procedure (Addendum)
TEE      Indication:   Severe AI  Patient sedated by anesthesia with Propofol Throat numbed with lidocaine Bite guard placed  TEE probe advanced to mid esophagus without difficulty  The AV is trileafleaflet.  The noncoronary cusp is thickened with abnormal tissue density .  Base of leaflet appears appears almost fenestrated.  No flow but concerning for abscess formation.  There is severe AI  Other leaflets show evid of infection  LVEF is normal    Full report to follow in CV section of chart  Procedure was without complication.  Dorris Carnes MD

## 2021-07-01 NOTE — Progress Notes (Signed)
[facility]  Date of Admission:  06/27/2021           Reason for visit: Follow up on concern for endocarditis  Current antibiotics: Vancomycin 2/17-present Ceftriaxone 2/17-present  Previous antibiotics: Cefepime 2/17 x 1 dose   ASSESSMENT:    31 y.o. male admitted with:  Concern for endocarditis: Patient presenting with daily rigors, chills, fatigue x1 week with TTE showing severe aortic regurgitation concerning for the possibility of aortic valve endocarditis.  Going for TEE today to further evaluate his aortic insufficiency and to assess for valvular vegetations.  Blood cultures x4 are currently no growth to date and patient is stable on vancomycin and ceftriaxone. Hidradenitis suppurativa: Patient with a history of chronic draining sinuses in the buttocks region due to presumed at bedtime.  Status postsurgical removal and skin graft approximately 10 years ago at [facility].  He has a new superficial wound adjacent to this area that does not show any current evidence of infection.  No other suspicious lesions in his axilla or groin.  RECOMMENDATIONS:    Continue vancomycin and ceftriaxone Await TEE Follow-up blood cultures Will follow   Principal Problem:   Endocarditis of aortic valve Active Problems:   Chest pain   Aortic valve regurgitation   Fever   Hidradenitis   Skin ulcer of buttock (HCC)   Sepsis (HCC)    MEDICATIONS:    Scheduled Meds:  colchicine  0.6 mg Oral BID   enoxaparin (LOVENOX) injection  40 mg Subcutaneous Q24H   ibuprofen  600 mg Oral TID   pantoprazole  40 mg Oral Daily   Continuous Infusions:  sodium chloride 20 mL/hr at 06/30/21 0826   cefTRIAXone (ROCEPHIN)  IV 2 g (06/30/21 1812)   vancomycin 1,500 mg (07/01/21 0844)   PRN Meds:.acetaminophen **OR** acetaminophen, lip balm  SUBJECTIVE:   24 hour events:  No acute events noted overnight Planning for TEE later today Afebrile, Tmax 99.1  over the last 24 hours Blood cultures x4 sets no growth to date  Patient with no new complaints.  Overall feeling well.  He is awaiting TEE.  Denies fevers or chills.  Denies shortness of breath.  Review of Systems  All other systems reviewed and are negative.    OBJECTIVE:   Blood pressure (!) 103/43, pulse 74, temperature 97.6 F (36.4 C), temperature source Oral, resp. rate 18, height 5\' 11"  (1.803 m), weight 109.5 kg, SpO2 91 %. Body mass index is 33.68 kg/m.  Physical Exam Constitutional:      General: He is not in acute distress.    Appearance: Normal appearance.  HENT:     Head: Normocephalic and atraumatic.  Eyes:     Extraocular Movements: Extraocular movements intact.     Conjunctiva/sclera: Conjunctivae normal.  Pulmonary:     Effort: Pulmonary effort is normal. No respiratory distress.  Abdominal:     General: Abdomen is flat. There is no distension.  Musculoskeletal:        General: Normal range of motion.     Cervical back: Normal range of motion and neck supple.  Skin:    General: Skin is warm and dry.     Findings: No rash.  Neurological:     General: No focal deficit present.     Mental Status: He is alert and oriented to person, place, and time.  Psychiatric:        Mood and Affect: Mood normal.  Behavior: Behavior normal.     Lab Results: Lab Results  Component Value Date   WBC 9.9 07/01/2021   HGB 11.9 (L) 07/01/2021   HCT 36.8 (L) 07/01/2021   MCV 79.3 (L) 07/01/2021   PLT 304 07/01/2021    Lab Results  Component Value Date   NA 137 07/01/2021   K 3.3 (L) 07/01/2021   CO2 25 07/01/2021   GLUCOSE 99 07/01/2021   BUN 10 07/01/2021   CREATININE 0.82 07/01/2021   CALCIUM 8.5 (L) 07/01/2021   GFRNONAA >60 07/01/2021    Lab Results  Component Value Date   ALT 24 06/27/2021   AST 25 06/27/2021   ALKPHOS 50 06/27/2021   BILITOT 0.7 06/27/2021       Component Value Date/Time   CRP 13.3 (H) 06/27/2021 1110       Component  Value Date/Time   ESRSEDRATE 51 (H) 06/27/2021 1110     I have reviewed the micro and lab results in Epic.  Imaging: No results found.   Imaging independently reviewed in Epic.    Raynelle Highland for Infectious Disease [facility] [phone] pager 07/01/2021, 11:31 AM

## 2021-07-01 NOTE — H&P (View-Only) (Signed)
[facility]  Date of Admission:  06/27/2021           Reason for visit: Follow up on concern for endocarditis  Current antibiotics: Vancomycin 2/17-present Ceftriaxone 2/17-present  Previous antibiotics: Cefepime 2/17 x 1 dose   ASSESSMENT:    31 y.o. male admitted with:  Concern for endocarditis: Patient presenting with daily rigors, chills, fatigue x1 week with TTE showing severe aortic regurgitation concerning for the possibility of aortic valve endocarditis.  Going for TEE today to further evaluate his aortic insufficiency and to assess for valvular vegetations.  Blood cultures x4 are currently no growth to date and patient is stable on vancomycin and ceftriaxone. Hidradenitis suppurativa: Patient with a history of chronic draining sinuses in the buttocks region due to presumed at bedtime.  Status postsurgical removal and skin graft approximately 10 years ago at [facility].  He has a new superficial wound adjacent to this area that does not show any current evidence of infection.  No other suspicious lesions in his axilla or groin.  RECOMMENDATIONS:    Continue vancomycin and ceftriaxone Await TEE Follow-up blood cultures Will follow   Principal Problem:   Endocarditis of aortic valve Active Problems:   Chest pain   Aortic valve regurgitation   Fever   Hidradenitis   Skin ulcer of buttock (HCC)   Sepsis (HCC)    MEDICATIONS:    Scheduled Meds:  colchicine  0.6 mg Oral BID   enoxaparin (LOVENOX) injection  40 mg Subcutaneous Q24H   ibuprofen  600 mg Oral TID   pantoprazole  40 mg Oral Daily   Continuous Infusions:  sodium chloride 20 mL/hr at 06/30/21 0826   cefTRIAXone (ROCEPHIN)  IV 2 g (06/30/21 1812)   vancomycin 1,500 mg (07/01/21 0844)   PRN Meds:.acetaminophen **OR** acetaminophen, lip balm  SUBJECTIVE:   24 hour events:  No acute events noted overnight Planning for TEE later today Afebrile, Tmax 99.1  over the last 24 hours Blood cultures x4 sets no growth to date  Patient with no new complaints.  Overall feeling well.  He is awaiting TEE.  Denies fevers or chills.  Denies shortness of breath.  Review of Systems  All other systems reviewed and are negative.    OBJECTIVE:   Blood pressure (!) 103/43, pulse 74, temperature 97.6 F (36.4 C), temperature source Oral, resp. rate 18, height 5\' 11"  (1.803 m), weight 109.5 kg, SpO2 91 %. Body mass index is 33.68 kg/m.  Physical Exam Constitutional:      General: He is not in acute distress.    Appearance: Normal appearance.  HENT:     Head: Normocephalic and atraumatic.  Eyes:     Extraocular Movements: Extraocular movements intact.     Conjunctiva/sclera: Conjunctivae normal.  Pulmonary:     Effort: Pulmonary effort is normal. No respiratory distress.  Abdominal:     General: Abdomen is flat. There is no distension.  Musculoskeletal:        General: Normal range of motion.     Cervical back: Normal range of motion and neck supple.  Skin:    General: Skin is warm and dry.     Findings: No rash.  Neurological:     General: No focal deficit present.     Mental Status: He is alert and oriented to person, place, and time.  Psychiatric:        Mood and Affect: Mood normal.  Behavior: Behavior normal.     Lab Results: Lab Results  Component Value Date   WBC 9.9 07/01/2021   HGB 11.9 (L) 07/01/2021   HCT 36.8 (L) 07/01/2021   MCV 79.3 (L) 07/01/2021   PLT 304 07/01/2021    Lab Results  Component Value Date   NA 137 07/01/2021   K 3.3 (L) 07/01/2021   CO2 25 07/01/2021   GLUCOSE 99 07/01/2021   BUN 10 07/01/2021   CREATININE 0.82 07/01/2021   CALCIUM 8.5 (L) 07/01/2021   GFRNONAA >60 07/01/2021    Lab Results  Component Value Date   ALT 24 06/27/2021   AST 25 06/27/2021   ALKPHOS 50 06/27/2021   BILITOT 0.7 06/27/2021       Component Value Date/Time   CRP 13.3 (H) 06/27/2021 1110       Component  Value Date/Time   ESRSEDRATE 51 (H) 06/27/2021 1110     I have reviewed the micro and lab results in Epic.  Imaging: No results found.   Imaging independently reviewed in Epic.    Raynelle Highland for Infectious Disease [facility] [phone] pager 07/01/2021, 11:31 AM

## 2021-07-01 NOTE — Progress Notes (Signed)
Pharmacy Antibiotic Note  Martin Stafford is a 31 y.o. male admitted on 06/27/2021, now with concern for possible endocarditis.  Pharmacy has been consulted for vancomycin dosing.  There has been no blood culture growth to date. 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 The patient has continued to have chills at less frequent intervals. WBC today is stable at 9.9. The patient remains afebrile, but is taking NSAIDs. The patient has a TEE scheduled today to evaluate for endocarditis. As plans for antibiotic use are pending on the TEE results, will wait until vegetation is confirmed before drawing vancomycin levels.  Plan: Continue vancomycin 1500mg  IV Q12H. Goal AUC 400-550.  Expected AUC 450.  SCr 0.84.  Ceftriaxone 2g IV Q24H. Vanc levels when appropriate  Height: 5\' 11"  (180.3 cm) Weight: 109.5 kg (241 lb 8 oz) IBW/kg (Calculated) : 75.3  Temp (24hrs), Avg:98.2 F (36.8 C), Min:97.6 F (36.4 C), Max:99.1 F (37.3 C)  Recent Labs  Lab 06/27/21 0547 06/28/21 0233 06/29/21 0227 06/30/21 0253 07/01/21 0132  WBC 10.8* 9.8 9.0 9.4 9.9  CREATININE 0.91 0.84 0.68 0.85 0.82     Estimated Creatinine Clearance: 168.8 mL/min (by C-G formula based on SCr of 0.82 mg/dL).    Allergies  Allergen Reactions   Eggs Or Egg-Derived Products     "eggs"   Peanut-Containing Drug Products     "peanuts"   Inpatient antibiotics Cefepime 2/17 x 1 Ceftriaxone 2/17>> Vancomycin 2/17>>  Cultures Bcx 2/16: NGTD Bcx 2/17: NGTD   Thank you for allowing pharmacy to participate in this patient's care.  3/16, PharmD PGY1 Pharmacy Resident 07/01/2021 12:13 PM Check [email or web address] for unit specific pharmacy number

## 2021-07-01 NOTE — Transfer of Care (Signed)
Immediate Anesthesia Transfer of Care Note  Patient: Martin Stafford  Procedure(s) Performed: TRANSESOPHAGEAL ECHOCARDIOGRAM (TEE)  Patient Location: Endoscopy Unit  Anesthesia Type:MAC  Level of Consciousness: drowsy and patient cooperative  Airway & Oxygen Therapy: Patient Spontanous Breathing and Patient connected to nasal cannula oxygen  Post-op Assessment: Report given to RN and Post -op Vital signs reviewed and stable  Post vital signs: Reviewed and stable  Last Vitals:  Vitals Value Taken Time  BP 147/52 07/01/21 1417  Temp    Pulse 97 07/01/21 1419  Resp 25 07/01/21 1419  SpO2 98 % 07/01/21 1419  Vitals shown include unvalidated device data.  Last Pain:  Vitals:   07/01/21 1220  TempSrc: Temporal  PainSc:       Patients Stated Pain Goal: 0 (44/92/01 0071)  Complications: No notable events documented.

## 2021-07-02 ENCOUNTER — Inpatient Hospital Stay (HOSPITAL_COMMUNITY): Payer: BC Managed Care – PPO

## 2021-07-02 ENCOUNTER — Encounter (HOSPITAL_COMMUNITY): Payer: Self-pay | Admitting: Internal Medicine

## 2021-07-02 DIAGNOSIS — R778 Other specified abnormalities of plasma proteins: Secondary | ICD-10-CM | POA: Diagnosis not present

## 2021-07-02 DIAGNOSIS — I330 Acute and subacute infective endocarditis: Secondary | ICD-10-CM

## 2021-07-02 DIAGNOSIS — I351 Nonrheumatic aortic (valve) insufficiency: Secondary | ICD-10-CM | POA: Diagnosis not present

## 2021-07-02 DIAGNOSIS — R079 Chest pain, unspecified: Secondary | ICD-10-CM | POA: Diagnosis not present

## 2021-07-02 DIAGNOSIS — R509 Fever, unspecified: Secondary | ICD-10-CM | POA: Diagnosis not present

## 2021-07-02 DIAGNOSIS — I358 Other nonrheumatic aortic valve disorders: Secondary | ICD-10-CM | POA: Diagnosis not present

## 2021-07-02 DIAGNOSIS — Z01810 Encounter for preprocedural cardiovascular examination: Secondary | ICD-10-CM | POA: Diagnosis not present

## 2021-07-02 DIAGNOSIS — L732 Hidradenitis suppurativa: Secondary | ICD-10-CM | POA: Diagnosis not present

## 2021-07-02 DIAGNOSIS — L0501 Pilonidal cyst with abscess: Secondary | ICD-10-CM | POA: Diagnosis not present

## 2021-07-02 DIAGNOSIS — L0591 Pilonidal cyst without abscess: Secondary | ICD-10-CM | POA: Diagnosis not present

## 2021-07-02 DIAGNOSIS — I352 Nonrheumatic aortic (valve) stenosis with insufficiency: Secondary | ICD-10-CM | POA: Diagnosis not present

## 2021-07-02 LAB — CBC
HCT: 36.9 % — ABNORMAL LOW (ref 39.0–52.0)
Hemoglobin: 12.1 g/dL — ABNORMAL LOW (ref 13.0–17.0)
MCH: 25.9 pg — ABNORMAL LOW (ref 26.0–34.0)
MCHC: 32.8 g/dL (ref 30.0–36.0)
MCV: 78.8 fL — ABNORMAL LOW (ref 80.0–100.0)
Platelets: 331 10*3/uL (ref 150–400)
RBC: 4.68 MIL/uL (ref 4.22–5.81)
RDW: 13.9 % (ref 11.5–15.5)
WBC: 11.3 10*3/uL — ABNORMAL HIGH (ref 4.0–10.5)
nRBC: 0 % (ref 0.0–0.2)

## 2021-07-02 LAB — BASIC METABOLIC PANEL
Anion gap: 10 (ref 5–15)
BUN: 12 mg/dL (ref 6–20)
CO2: 22 mmol/L (ref 22–32)
Calcium: 8.5 mg/dL — ABNORMAL LOW (ref 8.9–10.3)
Chloride: 104 mmol/L (ref 98–111)
Creatinine, Ser: 0.71 mg/dL (ref 0.61–1.24)
GFR, Estimated: >60 mL/min (ref >60)
Glucose, Bld: 101 mg/dL — ABNORMAL HIGH (ref 70–99)
Potassium: 3.6 mmol/L (ref 3.5–5.1)
Sodium: 136 mmol/L (ref 135–145)

## 2021-07-02 LAB — CULTURE, BLOOD (ROUTINE X 2)
Culture: NO GROWTH
Culture: NO GROWTH
Special Requests: ADEQUATE

## 2021-07-02 LAB — VANCOMYCIN, PEAK: Vancomycin Pk: 30 ug/mL (ref 30–40)

## 2021-07-02 LAB — TSH: TSH: 1.26 u[IU]/mL (ref 0.350–4.500)

## 2021-07-02 MED ORDER — POTASSIUM CHLORIDE CRYS ER 20 MEQ PO TBCR
40.0000 meq | EXTENDED_RELEASE_TABLET | Freq: Once | ORAL | Status: AC
  Administered 2021-07-02: 40 meq via ORAL
  Filled 2021-07-02: qty 2

## 2021-07-02 MED ORDER — CHLORHEXIDINE GLUCONATE 4 % EX LIQD: Freq: Every day | CUTANEOUS | Status: DC

## 2021-07-02 MED ORDER — METOPROLOL TARTRATE 5 MG/5ML IV SOLN
INTRAVENOUS | Status: AC
  Administered 2021-07-02: 10 mg
  Filled 2021-07-02: qty 10

## 2021-07-02 MED ORDER — CHLORHEXIDINE GLUCONATE 4 % EX LIQD
Freq: Every day | CUTANEOUS | Status: AC
  Filled 2021-07-02 (×2): qty 15

## 2021-07-02 MED ORDER — NITROGLYCERIN 0.4 MG SL SUBL
SUBLINGUAL_TABLET | SUBLINGUAL | Status: AC
  Administered 2021-07-02: 0.8 mg
  Filled 2021-07-02: qty 2

## 2021-07-02 MED ORDER — IOHEXOL 350 MG/ML SOLN
95.0000 mL | Freq: Once | INTRAVENOUS | Status: AC | PRN
  Administered 2021-07-02: 95 mL via INTRAVENOUS

## 2021-07-02 MED ORDER — METOPROLOL TARTRATE 100 MG PO TABS
100.0000 mg | ORAL_TABLET | Freq: Once | ORAL | Status: AC
  Administered 2021-07-02: 100 mg via ORAL
  Filled 2021-07-02: qty 1

## 2021-07-02 MED ORDER — VANCOMYCIN HCL 1500 MG/300ML IV SOLN
1500.0000 mg | Freq: Two times a day (BID) | INTRAVENOUS | Status: AC
  Administered 2021-07-02: 1500 mg via INTRAVENOUS
  Filled 2021-07-02: qty 300

## 2021-07-02 NOTE — Anesthesia Postprocedure Evaluation (Signed)
Anesthesia Post Note  Patient: Martin Stafford  Procedure(s) Performed: TRANSESOPHAGEAL ECHOCARDIOGRAM (TEE)     Patient location during evaluation: Endoscopy Anesthesia Type: MAC Level of consciousness: awake Pain management: pain level controlled Vital Signs Assessment: post-procedure vital signs reviewed and stable Respiratory status: spontaneous breathing, nonlabored ventilation, respiratory function stable and patient connected to nasal cannula oxygen Cardiovascular status: stable and blood pressure returned to baseline Postop Assessment: no apparent nausea or vomiting Anesthetic complications: no   No notable events documented.  Last Vitals:  Vitals:   07/02/21 0020 07/02/21 0512  BP:  125/69  Pulse:  81  Resp:  18  Temp: 37.1 C 36.8 C  SpO2:  98%    Last Pain:  Vitals:   07/02/21 0512  TempSrc: Oral  PainSc:                  Karyl Kinnier Linard Daft

## 2021-07-02 NOTE — Progress Notes (Signed)
FPTS Brief Progress Note  S: Patient sleeping soundly   O: BP 127/90 (BP Location: Left Arm)    Pulse 91    Temp 98.8 F (37.1 C) (Oral)    Resp 16    Ht 5\' 11"  (1.803 m)    Wt 109.5 kg    SpO2 90%    BMI 33.67 kg/m   General: Patient sleeping soundly Respiratory: Normal work of breathing on room air  A/P: Aortic valve endocarditis -Cardiology following, plan for surgery later this week pending results of dental evaluation -Continue to follow plan as outlined in cardiology and day team's progress notes  Precious Gilding, DO 07/02/2021, 1:29 AM PGY-1, [facility] Night Resident  Please page [phone] with questions.
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
## 2021-07-02 NOTE — Progress Notes (Signed)
°   07/02/21 1600  Clinical Encounter Type  Visited With Patient not available;Health care provider  Visit Type Initial  Referral From Nurse  Consult/Referral To Chaplain   Attempted patient consultation for Advance Directive. Patient had 8 visitors in the room. Will attempt visit again tomorrow. Chaplain Zanaiya Calabria, M.Min., [phone].

## 2021-07-02 NOTE — Progress Notes (Signed)
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

## 2021-07-02 NOTE — Progress Notes (Signed)
preop AVR has been completed.   Preliminary results in CV Proc.   Chinmay Squier Tamyra Fojtik 07/02/2021 10:07 AM

## 2021-07-02 NOTE — Plan of Care (Signed)
  Problem: Education: Goal: Knowledge of General Education information will improve Description Including pain rating scale, medication(s)/side effects and non-pharmacologic comfort measures Outcome: Progressing   

## 2021-07-02 NOTE — Consult Note (Signed)
Martin Stafford 12/19/1990  [id number].    Requesting MD: Dr. Dahlia Byes  Chief Complaint/Reason for Consult: draining pilonidal cyst with h/o endocarditis   HPI:  This is a 31 yo male with no significant PMH who presented to the ED on 06/27/21 with complaints of chest pain, SOB, sore throat, fevers, and chills who was found on his 3 rd visit to the ED to have a troponin over 100 and EKG findings that were concerning for biatrial enlargement and an incomplete RBBB.  He was noted to have a systolic and diastolic murmur on exam.  He was started on tx for pericarditis/myopericarditis.  He then underwent an ECHO which revealed severe aortic insufficiency.  It was then felt the patient had aortic valve endocarditis.  He has been started on vancomycin and Maxipime.  He has a history of a pilonidal cyst excision ~10 years ago that required a skin graft. This is now a chronic draining fistula. There is no clear source of his endocarditis and we have been asked to see him to evaluate this drainage as a possible source.   ROS: ROS: Please see HPI, otherwise all other systems have been reviewed and are negative.  Family History  Problem Relation Age of Onset   Hypertension Father    Hypertension Other    Diabetes Other    Asthma Mother     Past Medical History:  Diagnosis Date   Environmental allergies    Generalized skin cysts     Past Surgical History:  Procedure Laterality Date   COLON SURGERY     TEE WITHOUT CARDIOVERSION N/A 07/01/2021   Procedure: TRANSESOPHAGEAL ECHOCARDIOGRAM (TEE);  Surgeon: Fay Records, MD;  Location: [facility] ENDOSCOPY;  Service: Cardiovascular;  Laterality: N/A;    Social History:  reports that he has never smoked. He has never used smokeless tobacco. He reports that he does not drink alcohol and does not use drugs.  Allergies:  Allergies  Allergen Reactions   Eggs Or Egg-Derived Products     "eggs"   Peanut-Containing Drug Products     "peanuts"     Medications Prior to Admission  Medication Sig Dispense Refill   acetaminophen (TYLENOL) 325 MG tablet Take 650 mg by mouth every 6 (six) hours as needed for mild pain or headache.     Ascorbic Acid (VITAMIN C PO) Take 1 tablet by mouth daily.     omeprazole (PRILOSEC) 20 MG capsule Take 1 capsule (20 mg total) by mouth daily. 30 capsule 0   ondansetron (ZOFRAN) 4 MG tablet Take 1 tablet (4 mg total) by mouth every 6 (six) hours. 30 tablet 0   Pyridoxine HCl (VITAMIN B-6 PO) Take 1 tablet by mouth daily.     naproxen (NAPROSYN) 500 MG tablet Take 1 tablet (500 mg total) by mouth 2 (two) times daily with a meal. (Patient not taking: Reported on 06/27/2021) 30 tablet 0   pantoprazole (PROTONIX) 20 MG tablet Take 1 tablet (20 mg total) by mouth daily. (Patient not taking: Reported on 06/27/2021) 30 tablet 0   silver sulfADIAZINE (SILVADENE) 1 % cream Apply 1 application topically daily. (Patient not taking: Reported on 06/27/2021) 500 g 0     Physical Exam: Blood pressure 112/61, pulse 82, temperature 98.3 F (36.8 C), temperature source Oral, resp. rate 14, height 5\' 11"  (1.803 m), weight 109.5 kg, SpO2 97 %. General: pleasant, WD, WN male who is laying in bed in NAD HEENT: head is normocephalic, atraumatic.  Sclera  are noninjected.  Pupils equal and round.  Ears and nose without any masses or lesions.  Mouth is pink and moist Lungs:  Respiratory effort nonlabored Abd: soft, NT, ND, +BS, no masses, hernias, or organomegaly GU: sinus tract in L superior gluteal cleft with some bloody drainage, some chronically indurated areas along R gluteal cleft and groin with appearance of healed tracts; no erythema  MS: all 4 extremities are symmetrical with no cyanosis, clubbing, or edema. Skin: some chronic skin changes to posterior neck  Psych: A&Ox3 with an appropriate affect.   Results for orders placed or performed during the hospital encounter of 06/27/21 (from the past 48 hour(s))  CBC      Status: Abnormal   Collection Time: 07/01/21  1:32 AM  Result Value Ref Range   WBC 9.9 4.0 - 10.5 K/uL   RBC 4.64 4.22 - 5.81 MIL/uL   Hemoglobin 11.9 (L) 13.0 - 17.0 g/dL   HCT 36.8 (L) 39.0 - 52.0 %   MCV 79.3 (L) 80.0 - 100.0 fL   MCH 25.6 (L) 26.0 - 34.0 pg   MCHC 32.3 30.0 - 36.0 g/dL   RDW 14.0 11.5 - 15.5 %   Platelets 304 150 - 400 K/uL   nRBC 0.0 0.0 - 0.2 %    Comment: Performed at [facility], [place] [address], [place], [place] [id number]  Basic metabolic panel     Status: Abnormal   Collection Time: 07/01/21  1:32 AM  Result Value Ref Range   Sodium 137 135 - 145 mmol/L   Potassium 3.3 (L) 3.5 - 5.1 mmol/L   Chloride 103 98 - 111 mmol/L   CO2 25 22 - 32 mmol/L   Glucose, Bld 99 70 - 99 mg/dL    Comment: Glucose reference range applies only to samples taken after fasting for at least 8 hours.   BUN 10 6 - 20 mg/dL   Creatinine, Ser 0.82 0.61 - 1.24 mg/dL   Calcium 8.5 (L) 8.9 - 10.3 mg/dL   GFR, Estimated >60 >60 mL/min    Comment: (NOTE) Calculated using the CKD-EPI Creatinine Equation (2021)    Anion gap 9 5 - 15    Comment: Performed at [facility] [address] [place], [place] [address]  Urinalysis, Routine w reflex microscopic Urine, Clean Catch     Status: Abnormal   Collection Time: 07/01/21  5:34 PM  Result Value Ref Range   Color, Urine YELLOW YELLOW   APPearance CLEAR CLEAR   Specific Gravity, Urine 1.016 1.005 - 1.030   pH 5.0 5.0 - 8.0   Glucose, UA NEGATIVE NEGATIVE mg/dL   Hgb urine dipstick NEGATIVE NEGATIVE   Bilirubin Urine NEGATIVE NEGATIVE   Ketones, ur 5 (A) NEGATIVE mg/dL   Protein, ur NEGATIVE NEGATIVE mg/dL   Nitrite NEGATIVE NEGATIVE   Leukocytes,Ua NEGATIVE NEGATIVE    Comment: Performed at [facility] [address] [place],  [address]  Surgical pcr screen     Status: None   Collection Time: 07/01/21  9:22 PM   Specimen: Nasal Mucosa; Nasal Swab  Result Value Ref Range   MRSA, PCR NEGATIVE NEGATIVE    Staphylococcus aureus NEGATIVE NEGATIVE    Comment: (NOTE) The Xpert SA Assay (FDA approved for NASAL specimens in patients 43 years of age and older), is one component of a comprehensive surveillance program. It is not intended to diagnose infection nor to guide or monitor treatment. Performed at [facility],  [address] [place], [place] [id number]   Basic metabolic panel     Status: Abnormal   Collection Time: 07/02/21  4:59 AM  Result Value Ref Range   Sodium 136 135 - 145 mmol/L   Potassium 3.6 3.5 - 5.1 mmol/L   Chloride 104 98 - 111 mmol/L   CO2 22 22 - 32 mmol/L   Glucose, Bld 101 (H) 70 - 99 mg/dL    Comment: Glucose reference range applies only to samples taken after fasting for at least 8 hours.   BUN 12 6 - 20 mg/dL   Creatinine, Ser 0.71 0.61 - 1.24 mg/dL   Calcium 8.5 (L) 8.9 - 10.3 mg/dL   GFR, Estimated >60 >60 mL/min    Comment: (NOTE) Calculated using the CKD-EPI Creatinine Equation (2021)    Anion gap 10 5 - 15    Comment: Performed at [facility] [address], [place], [place] [address]  CBC     Status: Abnormal   Collection Time: 07/02/21  4:59 AM  Result Value Ref Range   WBC 11.3 (H) 4.0 - 10.5 K/uL   RBC 4.68 4.22 - 5.81 MIL/uL   Hemoglobin 12.1 (L) 13.0 - 17.0 g/dL   HCT 36.9 (L) 39.0 - 52.0 %   MCV 78.8 (L) 80.0 - 100.0 fL   MCH 25.9 (L) 26.0 - 34.0 pg   MCHC 32.8 30.0 - 36.0 g/dL   RDW 13.9 11.5 - 15.5 %   Platelets 331 150 - 400 K/uL   nRBC 0.0 0.0 - 0.2 %    Comment: Performed at [facility], [place] [address] [place], [place] [address]  TSH     Status: None   Collection Time: 07/02/21  4:59 AM  Result Value Ref Range   TSH 1.260 0.350 - 4.500 uIU/mL    Comment: Performed by a 3rd Generation assay with a functional sensitivity of <=0.01 uIU/mL. Performed at [facility], [place] [address], [place],  [address]    DG Orthopantogram  Result Date: 07/01/2021 CLINICAL DATA:  Preoperative.  Severe aortic  insufficiency. EXAM: ORTHOPANTOGRAM/PANORAMIC COMPARISON:  None. FINDINGS: The bony structures are normal.  No acute abnormality is noted. IMPRESSION: No acute abnormality. Electronically Signed   By: Abelardo Diesel M.D.   On: 07/01/2021 17:55   ECHO TEE  Result Date: 07/01/2021    TRANSESOPHOGEAL ECHO REPORT   Patient Name:   Martin Stafford Date of Exam: 07/01/2021 Medical Rec #:  [id number]         Height:       71.0 in Accession #:    [id number]        Weight:       241.5 lb Date of Birth:  1990-05-31         BSA:          2.284 m Patient Age:    30 years          BP:           140/74 mmHg Patient Gender: M                 HR:           96 bpm. Exam Location:  Inpatient Procedure: 3D Echo, Transesophageal Echo, Cardiac Doppler and Color Doppler Indications:     I35.1 Nonrheumatic aortic (valve) insufficiency  History:         Patient has prior history of Echocardiogram examinations, most  recent 06/27/2021. Aortic Valve Disease and Endocarditis;                  Signs/Symptoms:Chest Pain and Murmur.  Sonographer:     Roseanna Rainbow RDCS Referring Phys:  [id number] Margie Billet Diagnosing Phys: Dorris Carnes MD PROCEDURE: After discussion of the risks and benefits of a TEE, an informed consent was obtained from the patient. The transesophogeal probe was passed without difficulty through the esophogus of the patient. Imaged were obtained with the patient in a left lateral decubitus position. Sedation performed by different physician. The patient was monitored while under deep sedation. Anesthestetic sedation was provided intravenously by Anesthesiology: 498mg  of Propofol, 60mg  of Lidocaine. The patient's vital signs; including heart rate, blood pressure, and oxygen saturation; remained stable throughout the procedure. The patient developed no complications during the procedure. IMPRESSIONS  1. Left ventricular ejection fraction, by estimation, is 60 to 65%. The left ventricle has normal function.  There is moderate left ventricular hypertrophy.  2. Right ventricular systolic function is normal. The right ventricular size is normal.  3. No left atrial/left atrial appendage thrombus was detected.  4. A small pericardial effusion is present.  5. The mitral valve is normal in structure. Trivial mitral valve regurgitation.  6. The noncoronary cusp of the aortic valve is abnormally thickened. The basal portion has an almost fenestrated appearance, concerning for abscess formation. Aortic regurgitation is eccentric and severe with backflow from descending aorta.. The aortic valve is tricuspid. FINDINGS  Left Ventricle: Left ventricular ejection fraction, by estimation, is 60 to 65%. The left ventricle has normal function. The left ventricular internal cavity size was normal in size. There is moderate left ventricular hypertrophy. Right Ventricle: The right ventricular size is normal. Right vetricular wall thickness was not assessed. Right ventricular systolic function is normal. Left Atrium: Left atrial size was normal in size. No left atrial/left atrial appendage thrombus was detected. Right Atrium: Right atrial size was normal in size. Pericardium: A small pericardial effusion is present. Mitral Valve: The mitral valve is normal in structure. Trivial mitral valve regurgitation. Tricuspid Valve: The tricuspid valve is normal in structure. Tricuspid valve regurgitation is mild. Aortic Valve: The noncoronary cusp of the aortic valve is abnormally thickened. The basal portion has an almost fenestrated appearance, concerning for abscess formation. Aortic regurgitation is eccentric and severe with backflow from descending aorta. The aortic valve is tricuspid. Aortic valve regurgitation is severe. Pulmonic Valve: The pulmonic valve was normal in structure. Pulmonic valve regurgitation is not visualized. Aorta: The aortic root is normal in size and structure. IAS/Shunts: No atrial level shunt detected by color flow  Doppler. Dorris Carnes MD Electronically signed by Dorris Carnes MD Signature Date/Time: 07/01/2021/3:38:14 PM    Final    VAS US DOPPLER PRE CABG  Result Date: 07/02/2021 PREOPERATIVE VASCULAR EVALUATION Patient Name:  Martin Stafford  Date of Exam:   07/02/2021 Medical Rec #: [id number]          Accession #:    [id number] Date of Birth: [birth year]          Patient Gender: M Patient Age:   62 years Exam Location:  [facility] Procedure:      VAS US DOPPLER PRE CABG Referring Phys: Collier Salina VANTRIGT --------------------------------------------------------------------------------  Indications:      Preop AVR. Comparison Study: no prior Performing Technologist: Archie Patten RVS  Examination Guidelines: A complete evaluation includes B-mode imaging, spectral Doppler, color Doppler, and power Doppler as needed of all accessible  portions of each vessel. Bilateral testing is considered an integral part of a complete examination. Limited examinations for reoccurring indications may be performed as noted.  Right Carotid Findings: +----------+--------+--------+--------+------------+--------+             PSV cm/s EDV cm/s Stenosis Describe     Comments  +----------+--------+--------+--------+------------+--------+  CCA Prox   127                        heterogenous           +----------+--------+--------+--------+------------+--------+  CCA Distal 112      10                heterogenous           +----------+--------+--------+--------+------------+--------+  ICA Prox   99       22                heterogenous           +----------+--------+--------+--------+------------+--------+  ICA Distal 97       24                                       +----------+--------+--------+--------+------------+--------+  ECA        96                                                +----------+--------+--------+--------+------------+--------+ +----------+--------+-------+--------+------------+             PSV cm/s EDV cms Describe Arm  Pressure  +----------+--------+-------+--------+------------+  Subclavian 114                                     +----------+--------+-------+--------+------------+ +---------+--------+--+--------+--+---------+  Vertebral PSV cm/s 51 EDV cm/s 12 Antegrade  +---------+--------+--+--------+--+---------+ Left Carotid Findings: +----------+--------+--------+--------+------------+--------+             PSV cm/s EDV cm/s Stenosis Describe     Comments  +----------+--------+--------+--------+------------+--------+  CCA Prox   137      14                heterogenous           +----------+--------+--------+--------+------------+--------+  CCA Distal 95       13                heterogenous           +----------+--------+--------+--------+------------+--------+  ICA Prox   72       19                heterogenous           +----------+--------+--------+--------+------------+--------+  ICA Distal 96       29                                       +----------+--------+--------+--------+------------+--------+  ECA        115                                               +----------+--------+--------+--------+------------+--------+ +----------+--------+--------+--------+------------+  Subclavian PSV cm/s EDV cm/s Describe Arm Pressure  +----------+--------+--------+--------+------------+             137                                      +----------+--------+--------+--------+------------+ +---------+--------+--+--------+--+---------+  Vertebral PSV cm/s 77 EDV cm/s 11 Antegrade  +---------+--------+--+--------+--+---------+  ABI Findings: +--------+------------------+-----+---------+--------+  Right    Rt Pressure (mmHg) Index Waveform  Comment   +--------+------------------+-----+---------+--------+  Brachial                          triphasic           +--------+------------------+-----+---------+--------+ +--------+------------------+-----+---------+-------+  Left     Lt Pressure (mmHg) Index Waveform  Comment   +--------+------------------+-----+---------+-------+  Brachial                          triphasic          +--------+------------------+-----+---------+-------+  Right Doppler Findings: +--------+--------+-----+---------+--------+  Site     Pressure Index Doppler   Comments  +--------+--------+-----+---------+--------+  Brachial                triphasic           +--------+--------+-----+---------+--------+  Radial                  triphasic           +--------+--------+-----+---------+--------+  Ulnar                   triphasic           +--------+--------+-----+---------+--------+  Left Doppler Findings: +--------+--------+-----+---------+--------+  Site     Pressure Index Doppler   Comments  +--------+--------+-----+---------+--------+  Brachial                triphasic           +--------+--------+-----+---------+--------+  Radial                  triphasic           +--------+--------+-----+---------+--------+  Ulnar                   triphasic           +--------+--------+-----+---------+--------+  Summary: Right Carotid: The extracranial vessels were near-normal with only minimal wall                thickening or plaque. Left Carotid: The extracranial vessels were near-normal with only minimal wall               thickening or plaque. Vertebrals: Bilateral vertebral arteries demonstrate antegrade flow. Right Upper Extremity: Doppler waveforms remain within normal limits with right radial compression. Doppler waveforms remain within normal limits with right ulnar compression. Left Upper Extremity: Doppler waveforms remain within normal limits with left radial compression. Doppler waveforms remain within normal limits with left ulnar compression.  Electronically signed by Servando Snare MD on 07/02/2021 at 11:16:35 AM.    Final       Assessment/Plan Chronic pilonidal sinus tract The patient appears to have a chronic draining tract that has been present for years.  It does not appear acutely infected.  It is  not tender with no erythema or fluctuance noted.  However, in the setting that he is going to need a mechanical valve, it would be reasonable to  resect this area, place pro-healing agent, and then Prevena VAC over this site for 1 week.  Continue abx therapy.  Discussed with Dr. Ninfa Linden and Dr. Darcey Nora.  Will likely plan this tomorrow.   FEN - NPO p MN for OR tomorrow VTE - Lovenox ID - vanc, maxipime   Moderate Medical Decision Making  Barkley Boards, [facility] 07/02/2021, 1:45 PM Please see Amion for pager number during day hours 7:00am-4:30pm or 7:00am -11:30am on weekends

## 2021-07-02 NOTE — Progress Notes (Signed)
[facility]  Date of Admission:  06/27/2021      Total days of antibiotics 5  Vancomycin 2/16 >> current  Ceftriaxone 2/16 >> current          ASSESSMENT: Martin Stafford is a 31 y.o. 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 CT surgery following and preparing for valve replacement (currently scheduled 2/24).  Will plan to help coordinate sending valve tissue to [facility] lab for 16S rRNA sequencing in hopes to recover pathogen for him. All blood cultures drawn to date have been negative and he will continue on standard of care culture negative endocarditis treatment. He does not have any considerable risk for other atypical exposures.  Dental evaluation pending - panorex without any acute findings. He has had a molar bothering him of late.  CT surgery requesting general surgery to assess draining sinus near gluteal fold - does not appear inflamed; mostly bloody appearing drainage. All previous skin drainage cultures seem to suggest MRSA in the past.    PLAN: Follow for dental evaluation, gen surgery evaluation  Continue vancomycin + ceftriaxone Will facilitate molecular testing of valve tissue - will need to send specimen in sterile cup without formalin.    Principal Problem:   Endocarditis of aortic valve Active Problems:   Chest pain   Severe aortic insufficiency   Fever   Hidradenitis   Skin ulcer of buttock (HCC)   Sepsis (HCC)   Aortic valve abscess    [START ON 07/03/2021] chlorhexidine   Topical Daily   colchicine  0.6 mg Oral BID   enoxaparin (LOVENOX) injection  40 mg Subcutaneous Q24H   ibuprofen  600 mg Oral TID   pantoprazole  40 mg Oral Daily    SUBJECTIVE: Still with some chills today off and on. No other concerns that are new for him. Lot of information to take in with results of TEE and upcoming heart surgery to address valve abscess seen on imaging.   Wounds on  neck are not draining. Wound to gluteal fold has started draining mostly bloody thin fluid. No pain or discomfort here. He does not follow with dermatology for his HS.   Review of Systems: Review of Systems  Constitutional:  Positive for chills. Negative for fever.  Respiratory:  Negative for cough and shortness of breath.   Gastrointestinal:  Negative for abdominal pain, diarrhea, nausea and vomiting.  Skin:        Draining wound     Allergies  Allergen Reactions   Eggs Or Egg-Derived Products     "eggs"   Peanut-Containing Drug Products     "peanuts"    OBJECTIVE: Vitals:   07/02/21 0020 07/02/21 0512 07/02/21 1235 07/02/21 1239  BP:  125/69 123/66 123/66  Pulse:  81 80   Resp:  18    Temp: 98.8 F (37.1 C) 98.2 F (36.8 C)    TempSrc: Oral Oral    SpO2:  98%    Weight:      Height:       Body mass index is 33.67 kg/m.  Physical Exam Vitals reviewed.  Constitutional:      Appearance: He is well-developed. He is not ill-appearing.  Cardiovascular:     Rate and Rhythm: Normal rate. Extrasystoles are present.    Comments: Some PACs noted on tele  Pulmonary:     Effort: Pulmonary effort is normal.  Abdominal:  Palpations: Abdomen is soft.  Musculoskeletal:     Right lower leg: No edema.     Left lower leg: No edema.  Skin:    General: Skin is warm and dry.          Comments: One small open area to the neck fold with clear/white thin fluid expressed.  Small sinus seen with mostly bloody appearing fluid from gluteal fold.   Neurological:     Mental Status: He is alert.    Lab Results Lab Results  Component Value Date   WBC 11.3 (H) 07/02/2021   HGB 12.1 (L) 07/02/2021   HCT 36.9 (L) 07/02/2021   MCV 78.8 (L) 07/02/2021   PLT 331 07/02/2021    Lab Results  Component Value Date   CREATININE 0.71 07/02/2021   BUN 12 07/02/2021   NA 136 07/02/2021   K 3.6 07/02/2021   CL 104 07/02/2021   CO2 22 07/02/2021    Lab Results  Component Value Date    ALT 24 06/27/2021   AST 25 06/27/2021   ALKPHOS 50 06/27/2021   BILITOT 0.7 06/27/2021     Microbiology: Recent Results (from the past 240 hour(s))  Group A Strep by PCR     Status: None   Collection Time: 06/23/21  4:32 AM   Specimen: Throat; Sterile Swab  Result Value Ref Range Status   Group A Strep by PCR NOT DETECTED NOT DETECTED Final    Comment: Performed at [facility], [address] [place], [place] [id number]  Resp Panel by RT-PCR (Flu A&B, Covid) Throat     Status: None   Collection Time: 06/23/21  4:32 AM   Specimen: Throat; Nasopharyngeal(NP) swabs in vial transport medium  Result Value Ref Range Status   SARS Coronavirus 2 by RT PCR NEGATIVE NEGATIVE Final    Comment: (NOTE) SARS-CoV-2 target nucleic acids are NOT DETECTED.  The SARS-CoV-2 RNA is generally detectable in upper respiratory specimens during the acute phase of infection. The lowest concentration of SARS-CoV-2 viral copies this assay can detect is 138 copies/mL. A negative result does not preclude SARS-Cov-2 infection and should not be used as the sole basis for treatment or other patient management decisions. A negative result may occur with  improper specimen collection/handling, submission of specimen other than nasopharyngeal swab, presence of viral mutation(s) within the areas targeted by this assay, and inadequate number of viral copies(<138 copies/mL). A negative result must be combined with clinical observations, patient history, and epidemiological information. The expected result is Negative.  Fact Sheet for Patients:  BloggerCourse.comhttps://www.fda.gov/media/152166/download  Fact Sheet for Healthcare Providers:  SeriousBroker.ithttps://www.fda.gov/media/152162/download  This test is no t yet approved or cleared by the [place] FDA and  has been authorized for detection and/or diagnosis of SARS-CoV-2 by FDA under an Emergency Use Authorization (EUA). This EUA will remain  in effect (meaning this test  can be used) for the duration of the COVID-19 declaration under Section 564(b)(1) of the Act, 21 U.S.C.section 360bbb-3(b)(1), unless the authorization is terminated  or revoked sooner.       Influenza A by PCR NEGATIVE NEGATIVE Final   Influenza B by PCR NEGATIVE NEGATIVE Final    Comment: (NOTE) The Xpert Xpress SARS-CoV-2/FLU/RSV plus assay is intended as an aid in the diagnosis of influenza from Nasopharyngeal swab specimens and should not be used as a sole basis for treatment. Nasal washings and aspirates are unacceptable for Xpert Xpress SARS-CoV-2/FLU/RSV testing.  Fact Sheet for Patients: BloggerCourse.comhttps://www.fda.gov/media/152166/download  Fact  Sheet for Healthcare Providers: SeriousBroker.it  This test is not yet approved or cleared by the [place] and has been authorized for detection and/or diagnosis of SARS-CoV-2 by FDA under an Emergency Use Authorization (EUA). This EUA will remain in effect (meaning this test can be used) for the duration of the COVID-19 declaration under Section 564(b)(1) of the Act, 21 U.S.C. section 360bbb-3(b)(1), unless the authorization is terminated or revoked.  Performed at [facility], [address] [place], [place] [address]   Resp Panel by RT-PCR (Flu A&B, Covid) Nasopharyngeal Swab     Status: None   Collection Time: 06/27/21  6:32 AM   Specimen: Nasopharyngeal Swab; Nasopharyngeal(NP) swabs in vial transport medium  Result Value Ref Range Status   SARS Coronavirus 2 by RT PCR NEGATIVE NEGATIVE Final    Comment: (NOTE) SARS-CoV-2 target nucleic acids are NOT DETECTED.  The SARS-CoV-2 RNA is generally detectable in upper respiratory specimens during the acute phase of infection. The lowest concentration of SARS-CoV-2 viral copies this assay can detect is 138 copies/mL. A negative result does not preclude SARS-Cov-2 infection and should not be used as the sole basis for treatment or other patient  management decisions. A negative result may occur with  improper specimen collection/handling, submission of specimen other than nasopharyngeal swab, presence of viral mutation(s) within the areas targeted by this assay, and inadequate number of viral copies(<138 copies/mL). A negative result must be combined with clinical observations, patient history, and epidemiological information. The expected result is Negative.  Fact Sheet for Patients:  BloggerCourse.com  Fact Sheet for Healthcare Providers:  SeriousBroker.it  This test is no t yet approved or cleared by the [place] FDA and  has been authorized for detection and/or diagnosis of SARS-CoV-2 by FDA under an Emergency Use Authorization (EUA). This EUA will remain  in effect (meaning this test can be used) for the duration of the COVID-19 declaration under Section 564(b)(1) of the Act, 21 U.S.C.section 360bbb-3(b)(1), unless the authorization is terminated  or revoked sooner.       Influenza A by PCR NEGATIVE NEGATIVE Final   Influenza B by PCR NEGATIVE NEGATIVE Final    Comment: (NOTE) The Xpert Xpress SARS-CoV-2/FLU/RSV plus assay is intended as an aid in the diagnosis of influenza from Nasopharyngeal swab specimens and should not be used as a sole basis for treatment. Nasal washings and aspirates are unacceptable for Xpert Xpress SARS-CoV-2/FLU/RSV testing.  Fact Sheet for Patients: BloggerCourse.com  Fact Sheet for Healthcare Providers: SeriousBroker.it  This test is not yet approved or cleared by the [place] FDA and has been authorized for detection and/or diagnosis of SARS-CoV-2 by FDA under an Emergency Use Authorization (EUA). This EUA will remain in effect (meaning this test can be used) for the duration of the COVID-19 declaration under Section 564(b)(1) of the Act, 21 U.S.C. section 360bbb-3(b)(1),  unless the authorization is terminated or revoked.  Performed at [facility], [address] [place], [place] [address]   Blood culture (routine x 2)     Status: None   Collection Time: 06/27/21 11:00 AM   Specimen: BLOOD  Result Value Ref Range Status   Specimen Description BLOOD RIGHT ANTECUBITAL  Final   Special Requests   Final    BOTTLES DRAWN AEROBIC AND ANAEROBIC Blood Culture results may not be optimal due to an inadequate volume of blood received in culture bottles   Culture   Final    NO GROWTH 5 DAYS Performed  at [facility], [address], [place], [place] [address]    Report Status 07/02/2021 FINAL  Final  Blood culture (routine x 2)     Status: None   Collection Time: 06/27/21 11:10 AM   Specimen: BLOOD  Result Value Ref Range Status   Specimen Description BLOOD LEFT ANTECUBITAL  Final   Special Requests   Final    BOTTLES DRAWN AEROBIC AND ANAEROBIC Blood Culture adequate volume   Culture   Final    NO GROWTH 5 DAYS Performed at [facility], [address] [place], [place] [address]    Report Status 07/02/2021 FINAL  Final  Culture, blood (Routine X 2) w Reflex to ID Panel     Status: None (Preliminary result)   Collection Time: 06/28/21  1:23 PM   Specimen: BLOOD  Result Value Ref Range Status   Specimen Description BLOOD LEFT ANTECUBITAL  Final   Special Requests   Final    BOTTLES DRAWN AEROBIC AND ANAEROBIC Blood Culture adequate volume   Culture   Final    NO GROWTH 4 DAYS Performed at [facility], [address] [place], [place] [address]    Report Status PENDING  Incomplete  Culture, blood (Routine X 2) w Reflex to ID Panel     Status: None (Preliminary result)   Collection Time: 06/28/21  1:28 PM   Specimen: BLOOD LEFT HAND  Result Value Ref Range Status   Specimen Description BLOOD LEFT HAND  Final   Special Requests   Final    BOTTLES DRAWN AEROBIC ONLY Blood Culture results may not be optimal due to an  inadequate volume of blood received in culture bottles   Culture   Final    NO GROWTH 4 DAYS Performed at [facility], [address], [place], [place] [address]    Report Status PENDING  Incomplete  Surgical pcr screen     Status: None   Collection Time: 07/01/21  9:22 PM   Specimen: Nasal Mucosa; Nasal Swab  Result Value Ref Range Status   MRSA, PCR NEGATIVE NEGATIVE Final   Staphylococcus aureus NEGATIVE NEGATIVE Final    Comment: (NOTE) The Xpert SA Assay (FDA approved for NASAL specimens in patients 62 years of age and older), is one component of a comprehensive surveillance program. It is not intended to diagnose infection nor to guide or monitor treatment. Performed at [facility], [address], [place], [place] [address]     Rexene Alberts, MSN, NP-C [facility]  [email or web address] Pager: [phone] Office: [phone] [facility] Main Line: [phone] *Secure Chat Communication Welcome

## 2021-07-02 NOTE — Progress Notes (Addendum)
Progress Note  Patient Name: Martin Stafford Date of Encounter: 07/02/2021  [facility] Cardiologist: Sanda Klein, MD   Subjective   Denies any chest pain or shortness of breath.  TEE yesterday showed aortic valve endocarditis normal sinus rhythm no new EKG to review  Inpatient Medications    Scheduled Meds:  chlorhexidine   Topical Daily   colchicine  0.6 mg Oral BID   enoxaparin (LOVENOX) injection  40 mg Subcutaneous Q24H   ibuprofen  600 mg Oral TID   pantoprazole  40 mg Oral Daily   Continuous Infusions:  cefTRIAXone (ROCEPHIN)  IV 2 g (07/01/21 1852)   vancomycin 1,500 mg (07/01/21 2017)   PRN Meds: acetaminophen **OR** acetaminophen, lip balm   Vital Signs    Vitals:   07/01/21 1543 07/01/21 2039 07/02/21 0020 07/02/21 0512  BP: (!) 143/84 127/90  125/69  Pulse: 96 91  81  Resp: 16   18  Temp: 98.1 F (36.7 C) 98 F (36.7 C) 98.8 F (37.1 C) 98.2 F (36.8 C)  TempSrc: Oral Oral Oral Oral  SpO2: 90%   98%  Weight:      Height:        Intake/Output Summary (Last 24 hours) at 07/02/2021 0710 Last data filed at 07/02/2021 0020 Gross per 24 hour  Intake --  Output 550 ml  Net -550 ml    Last 3 Weights 07/01/2021 06/27/2021 06/26/2021  Weight (lbs) 241 lb 6.5 oz 241 lb 8 oz 235 lb  Weight (kg) 109.5 kg 109.544 kg 106.595 kg      Telemetry    SR to ST with PACs and SVT - Personally Reviewed  ECG    No new - Personally Reviewed  Physical Exam   GEN: Well nourished, well developed in no acute distress HEENT: Normal NECK: No JVD; No carotid bruits LYMPHATICS: No lymphadenopathy CARDIAC:RRR, no murmurs, rubs, gallops RESPIRATORY:  Clear to auscultation without rales, wheezing or rhonchi  ABDOMEN: Soft, non-tender, non-distended MUSCULOSKELETAL:  No edema; No deformity  SKIN: Warm and dry NEUROLOGIC:  Alert and oriented x 3 PSYCHIATRIC:  Normal affect   Labs    High Sensitivity Troponin:   Recent Labs  Lab 06/24/21 0903  06/27/21 0547 06/27/21 0740 06/27/21 1237 06/27/21 1549  TROPONINIHS 4 122* 140* 114* 109*      Chemistry Recent Labs  Lab 06/27/21 0547 06/28/21 0233 06/30/21 0253 07/01/21 0132 07/02/21 0459  NA 134*   < > 137 137 136  K 4.0   < > 3.5 3.3* 3.6  CL 98   < > 103 103 104  CO2 25   < > _0 GLUCOSE 110*   < > 105* 99 101*  BUN 11   < > _1 CREATININE 0.91   < > 0.85 0.82 0.71  CALCIUM 9.2   < > 8.5* 8.5* 8.5*  PROT 8.4*  --   --   --   --   ALBUMIN 3.8  --   --   --   --   AST 25  --   --   --   --   ALT 24  --   --   --   --   ALKPHOS 50  --   --   --   --   BILITOT 0.7  --   --   --   --   GFRNONAA >60   < > >60 >60 >60  ANIONGAP 11   < >  _0 < > = values in this interval not displayed.     Lipids No results for input(s): CHOL, TRIG, HDL, LABVLDL, LDLCALC, CHOLHDL in the last 168 hours.  Hematology Recent Labs  Lab 06/30/21 0253 07/01/21 0132 07/02/21 0459  WBC 9.4 9.9 11.3*  RBC 4.52 4.64 4.68  HGB 12.0* 11.9* 12.1*  HCT 36.2* 36.8* 36.9*  MCV 80.1 79.3* 78.8*  MCH 26.5 25.6* 25.9*  MCHC 33.1 32.3 32.8  RDW 13.9 14.0 13.9  PLT 283 304 331    Thyroid  Recent Labs  Lab 07/02/21 0459  TSH 1.260     BNPNo results for input(s): BNP, PROBNP in the last 168 hours.  DDimer No results for input(s): DDIMER in the last 168 hours.   Radiology    DG Orthopantogram  Result Date: 07/01/2021 CLINICAL DATA:  Preoperative.  Severe aortic insufficiency. EXAM: ORTHOPANTOGRAM/PANORAMIC COMPARISON:  None. FINDINGS: The bony structures are normal.  No acute abnormality is noted. IMPRESSION: No acute abnormality. Electronically Signed   By: Abelardo Diesel M.D.   On: 07/01/2021 17:55   ECHO TEE  Result Date: 07/01/2021    TRANSESOPHOGEAL ECHO REPORT   Patient Name:   Martin Stafford Date of Exam: 07/01/2021 Medical Rec #:  [id number]         Height:       71.0 in Accession #:    [id number]        Weight:       241.5 lb Date of Birth:  09/05/1990          BSA:          2.284 m Patient Age:    30 years          BP:           140/74 mmHg Patient Gender: M                 HR:           96 bpm. Exam Location:  Inpatient Procedure: 3D Echo, Transesophageal Echo, Cardiac Doppler and Color Doppler Indications:     I35.1 Nonrheumatic aortic (valve) insufficiency  History:         Patient has prior history of Echocardiogram examinations, most                  recent 06/27/2021. Aortic Valve Disease and Endocarditis;                  Signs/Symptoms:Chest Pain and Murmur.  Sonographer:     Roseanna Rainbow RDCS Referring Phys:  [id number] Margie Billet Diagnosing Phys: Dorris Carnes MD PROCEDURE: After discussion of the risks and benefits of a TEE, an informed consent was obtained from the patient. The transesophogeal probe was passed without difficulty through the esophogus of the patient. Imaged were obtained with the patient in a left lateral decubitus position. Sedation performed by different physician. The patient was monitored while under deep sedation. Anesthestetic sedation was provided intravenously by Anesthesiology: 412m of Propofol, 636mof Lidocaine. The patient's vital signs; including heart rate, blood pressure, and oxygen saturation; remained stable throughout the procedure. The patient developed no complications during the procedure. IMPRESSIONS  1. Left ventricular ejection fraction, by estimation, is 60 to 65%. The left ventricle has normal function. There is moderate left ventricular hypertrophy.  2. Right ventricular systolic function is normal. The right ventricular size is normal.  3. No left atrial/left atrial appendage thrombus was detected.  4. A  small pericardial effusion is present.  5. The mitral valve is normal in structure. Trivial mitral valve regurgitation.  6. The noncoronary cusp of the aortic valve is abnormally thickened. The basal portion has an almost fenestrated appearance, concerning for abscess formation. Aortic regurgitation is eccentric and  severe with backflow from descending aorta.. The aortic valve is tricuspid. FINDINGS  Left Ventricle: Left ventricular ejection fraction, by estimation, is 60 to 65%. The left ventricle has normal function. The left ventricular internal cavity size was normal in size. There is moderate left ventricular hypertrophy. Right Ventricle: The right ventricular size is normal. Right vetricular wall thickness was not assessed. Right ventricular systolic function is normal. Left Atrium: Left atrial size was normal in size. No left atrial/left atrial appendage thrombus was detected. Right Atrium: Right atrial size was normal in size. Pericardium: A small pericardial effusion is present. Mitral Valve: The mitral valve is normal in structure. Trivial mitral valve regurgitation. Tricuspid Valve: The tricuspid valve is normal in structure. Tricuspid valve regurgitation is mild. Aortic Valve: The noncoronary cusp of the aortic valve is abnormally thickened. The basal portion has an almost fenestrated appearance, concerning for abscess formation. Aortic regurgitation is eccentric and severe with backflow from descending aorta. The aortic valve is tricuspid. Aortic valve regurgitation is severe. Pulmonic Valve: The pulmonic valve was normal in structure. Pulmonic valve regurgitation is not visualized. Aorta: The aortic root is normal in size and structure. IAS/Shunts: No atrial level shunt detected by color flow Doppler. Dorris Carnes MD Electronically signed by Dorris Carnes MD Signature Date/Time: 07/01/2021/3:38:14 PM    Final     Cardiac Studies   Echocardiogram 06/27/2021    1. The aortic valve leafets are not well visualized but the valve appears  tricuspid with no significant calcification/thickening. There is very  eccentric, severe aortic regurgitation with diffuse color flow seen in the  LVOT. There is diastolic flow  reversal noted in the abdominal aorta. No distinct vegetations visualized.  No dissection flap seen but  minimal visualization of the ascending aorta.  Recommend TEE +/- CTA for further evaluation.   2. Left ventricular ejection fraction, by estimation, is 55 to 60%. The  left ventricle has normal function. The left ventricle has no regional  wall motion abnormalities. There is moderate concentric left ventricular  hypertrophy. Left ventricular  diastolic parameters are consistent with Grade I diastolic dysfunction  (impaired relaxation).   3. Right ventricular systolic function is normal. The right ventricular  size is normal.   4. Left atrial size was mildly dilated.   5. A small pericardial effusion is present. The pericardial effusion is  circumferential.   6. The mitral valve is normal in structure. Trivial mitral valve  regurgitation.   7. The inferior vena cava is dilated in size with <50% respiratory  variability, suggesting right atrial pressure of 15 mmHg.   8. Aortic dilatation noted. There is mild dilatation of the aortic root,  measuring 39 mm.   TEE 07/01/21 IMPRESSIONS   1. Left ventricular ejection fraction, by estimation, is 60 to 65%. The  left ventricle has normal function. There is moderate left ventricular  hypertrophy.   2. Right ventricular systolic function is normal. The right ventricular  size is normal.   3. No left atrial/left atrial appendage thrombus was detected.   4. A small pericardial effusion is present.   5. The mitral valve is normal in structure. Trivial mitral valve  regurgitation.   6. The noncoronary cusp of the aortic valve  is abnormally thickened. The  basal portion has an almost fenestrated appearance, concerning for abscess  formation. Aortic regurgitation is eccentric and severe with backflow from  descending aorta.. The aortic  valve is tricuspid.     Patient Profile     31 y.o. male without significant medical history, admitted with chest pain 06/27/21 found to have severe AR EF 55-60% mod LVH.     Assessment & Plan    Chest  Pain - Pericarditis vs. Endocarditis - Patient presented complaining of left sided chest pain for 4-5 days. Sharp. Worse in certain positions. Not associated with exertion. Improved with tylenol. Associated with dry cough, chills and low grade fever in ED. He has been seen in the ED on 2/12, 2/13, 2/15 for similar complaints.  - HSTN have been negative at past ER visits. HsTN 122>>140>>114>>109 this admission  - CRP 13.3, ESR 51 - CXR showed no acute cardiopulmonary disease  - Echo this admission showed severe aortic regurgitation, no distinct vegetations or aortic dissection. LVEF 55-60%, moderate LVH, grade I diastolic dysfunction.  - With new, severe AI, were concerned for aortic dissection. Initial chest CT angio showed borderline-mild cardiomegaly. Insufficient study to rule out aortic dissection. CT angio aorta showed no evidence of aortic aneurysm or aortic dissection.  - will get coronary CTA today to define coronary anatomy prior to aortic valve surgery>> heart rate is in the 80s this morning so we will need metoprolol tartrate 100 mg 1 hour prior to CT  Aortic valve endocarditis -TEE yesterday showed abnormally thickened noncoronary cusp of the aortic valve that appears fenestrated and likely represents abscess formation with severe eccentric AI with flow reversal in the descending aorta - Blood cultures show NGTD (at 3 days since drawn)  -Likely source is a chronic draining skin lesion in his sacral area and neck due to hidradenitis and is also had complaints of left sided lower tooth issues - Appreciate CVTS input>> he will need valve replacement with possible root replacement later this week - Dentistry has been consulted - ID is following and managing antibiotics >> currently on vancomycin and ceftriaxone.    For questions or updates, please contact [place] Please consult [email or web address] for contact info under        Signed, Fransico Him, MD  07/02/2021, 7:10 AM

## 2021-07-02 NOTE — Progress Notes (Addendum)
Family Medicine Teaching Service Daily Progress Note Intern Pager: [phone]  Patient name: Martin Stafford Medical record number: [id number] Date of birth: 1991/03/18 Age: 31 y.o. Gender: male  Primary Care Provider: Patient, No Pcp Per (Inactive) Consultants: Cardiology, ID, CT surgery Code Status: Full  Pt Overview and Major Events to Date:  2/16- admitted 2/20- TEE showing aortic valve endocarditis  Assessment and Plan: Mr. Felland is a 31yo male with no significant medical history who presented with L-sided chest pain, subsequently found to have endocarditis and a possible abscess at the aortic root.   Aortic Valve Endocarditis TEE revealed concern for ascess formation at the base of the non-coronary cusp of the aortic valve and severe AI. He was seen by CT surgery with plan for surgery Wednesday vs Friday. He is scheduled for a pre=op Doppler today in preparation for surgery.  Also scheduled for CT coronary.  There is still no clear source of infection, he denies any history of IVDU. An orthopantogram obtained yesterday was negative for dental source of infection. Blood cx remain with NGTD. WBC up slightly today 9.9>11.3.  -Follow-up Doppler and CT coronary -CT surgery following, appreciate their assistance -We will need to be n.p.o. at midnight prior to surgery -Dentistry has been consulted to evaluate for possible dental sources of infection despite negative orthopantogram - Continue vanc + ceftriaxone, defer to ID for antibiotic choice  Hidradenitis Suppurativa If no dental source of infection is identified, this is the only other presumed source of potential infection.  - CHG baths per CT surgery  - Antibiotics as above  FEN/GI: NPO for procedures today  PPx: Lovenox Dispo: Pending further workup and intervention  Subjective:  Mr. Tram reports feeling okay today. He has no acute complaints and denies any chills/rigors in past 24 hours. He has no questions about his  upcoming procedures and expresses gratitude for the care team.   Objective: Temp:  [97.1 F (36.2 C)-98.8 F (37.1 C)] 98.2 F (36.8 C) (02/21 0512) Pulse Rate:  [74-106] 81 (02/21 0512) Resp:  [15-20] 18 (02/21 0512) BP: (103-155)/(42-90) 125/69 (02/21 0512) SpO2:  [90 %-98 %] 98 % (02/21 0512) Weight:  [109.5 kg] 109.5 kg (02/20 1220) Physical Exam: General: Awake, alert, NAD Cardiovascular: RRR, muffled heart sounds, systolic and diastolic murmurs appreciated Respiratory: Lungs CTAB, normal WOB on RA Abdomen: Soft, non-tender, without mass  Extremities: Without edema or deformity   Laboratory: Recent Labs  Lab 06/30/21 0253 07/01/21 0132 07/02/21 0459  WBC 9.4 9.9 11.3*  HGB 12.0* 11.9* 12.1*  HCT 36.2* 36.8* 36.9*  PLT 283 304 331   Recent Labs  Lab 06/27/21 0547 06/28/21 0233 06/30/21 0253 07/01/21 0132 07/02/21 0459  NA 134*   < > 137 137 136  K 4.0   < > 3.5 3.3* 3.6  CL 98   < > 103 103 104  CO2 25   < > 25 25 22   BUN 11   < > 12 10 12   CREATININE 0.91   < > 0.85 0.82 0.71  CALCIUM 9.2   < > 8.5* 8.5* 8.5*  PROT 8.4*  --   --   --   --   BILITOT 0.7  --   --   --   --   ALKPHOS 50  --   --   --   --   ALT 24  --   --   --   --   AST 25  --   --   --   --  GLUCOSE 110*   < > 105* 99 101*   < > = values in this interval not displayed.    Imaging/Diagnostic Tests: DG Orthopantogram CLINICAL DATA:  Preoperative.  Severe aortic insufficiency.  EXAM: ORTHOPANTOGRAM/PANORAMIC  COMPARISON:  None.  FINDINGS: The bony structures are normal.  No acute abnormality is noted.  IMPRESSION: No acute abnormality.  Electronically Signed   By: Abelardo Diesel M.D.   On: 07/01/2021 17:55 ECHO TEE    TRANSESOPHOGEAL ECHO REPORT       Patient Name:   Martin Stafford Date of Exam: 07/01/2021 Medical Rec #:  [id number]         Height:       71.0 in Accession #:    [id number]        Weight:       241.5 lb Date of Birth:  Jul 12, 1990         BSA:           2.284 m Patient Age:    30 years          BP:           140/74 mmHg Patient Gender: M                 HR:           96 bpm. Exam Location:  Inpatient  Procedure: 3D Echo, Transesophageal Echo, Cardiac Doppler and Color Doppler  Indications:     I35.1 Nonrheumatic aortic (valve) insufficiency   History:         Patient has prior history of Echocardiogram examinations, most                  recent 06/27/2021. Aortic Valve Disease and Endocarditis;                  Signs/Symptoms:Chest Pain and Murmur.   Sonographer:     Roseanna Rainbow RDCS Referring Phys:  [id number] Margie Billet Diagnosing Phys: Dorris Carnes MD  PROCEDURE: After discussion of the risks and benefits of a TEE, an informed consent was obtained from the patient. The transesophogeal probe was passed without difficulty through the esophogus of the patient. Imaged were obtained with the patient in a  left lateral decubitus position. Sedation performed by different physician. The patient was monitored while under deep sedation. Anesthestetic sedation was provided intravenously by Anesthesiology: 498mg  of Propofol, 60mg  of Lidocaine. The patient's  vital signs; including heart rate, blood pressure, and oxygen saturation; remained stable throughout the procedure. The patient developed no complications during the procedure.  IMPRESSIONS   1. Left ventricular ejection fraction, by estimation, is 60 to 65%. The left ventricle has normal function. There is moderate left ventricular hypertrophy.  2. Right ventricular systolic function is normal. The right ventricular size is normal.  3. No left atrial/left atrial appendage thrombus was detected.  4. A small pericardial effusion is present.  5. The mitral valve is normal in structure. Trivial mitral valve regurgitation.  6. The noncoronary cusp of the aortic valve is abnormally thickened. The basal portion has an almost fenestrated appearance, concerning for abscess formation. Aortic  regurgitation is eccentric and severe with backflow from descending aorta.. The aortic  valve is tricuspid.  FINDINGS  Left Ventricle: Left ventricular ejection fraction, by estimation, is 60 to 65%. The left ventricle has normal function. The left ventricular internal cavity size was normal in size. There is moderate left ventricular hypertrophy.  Right Ventricle: The right ventricular size is  normal. Right vetricular wall thickness was not assessed. Right ventricular systolic function is normal.  Left Atrium: Left atrial size was normal in size. No left atrial/left atrial appendage thrombus was detected.  Right Atrium: Right atrial size was normal in size.  Pericardium: A small pericardial effusion is present.  Mitral Valve: The mitral valve is normal in structure. Trivial mitral valve regurgitation.  Tricuspid Valve: The tricuspid valve is normal in structure. Tricuspid valve regurgitation is mild.  Aortic Valve: The noncoronary cusp of the aortic valve is abnormally thickened. The basal portion has an almost fenestrated appearance, concerning for abscess formation. Aortic regurgitation is eccentric and severe with backflow from descending aorta.  The aortic valve is tricuspid. Aortic valve regurgitation is severe.  Pulmonic Valve: The pulmonic valve was normal in structure. Pulmonic valve regurgitation is not visualized.  Aorta: The aortic root is normal in size and structure.  IAS/Shunts: No atrial level shunt detected by color flow Doppler.  Dorris Carnes MD Electronically signed by Dorris Carnes MD Signature Date/Time: 07/01/2021/3:38:14 PM      Final      Eppie Gibson, MD 07/02/2021, 6:40 AM PGY-1, [place] Intern pager: [phone], text pages welcome

## 2021-07-03 ENCOUNTER — Inpatient Hospital Stay (HOSPITAL_COMMUNITY): Payer: BC Managed Care – PPO | Admitting: Certified Registered Nurse Anesthetist

## 2021-07-03 ENCOUNTER — Encounter (HOSPITAL_COMMUNITY)
Admission: EM | Disposition: A | Discharge status: Home Health | Payer: Self-pay | Source: Home / Self Care | Attending: Cardiothoracic Surgery

## 2021-07-03 ENCOUNTER — Encounter (HOSPITAL_COMMUNITY): Payer: Self-pay | Admitting: Family Medicine

## 2021-07-03 ENCOUNTER — Other Ambulatory Visit: Payer: Self-pay

## 2021-07-03 DIAGNOSIS — I350 Nonrheumatic aortic (valve) stenosis: Secondary | ICD-10-CM | POA: Diagnosis not present

## 2021-07-03 DIAGNOSIS — R0789 Other chest pain: Secondary | ICD-10-CM | POA: Diagnosis not present

## 2021-07-03 DIAGNOSIS — L98419 Non-pressure chronic ulcer of buttock with unspecified severity: Secondary | ICD-10-CM | POA: Diagnosis not present

## 2021-07-03 DIAGNOSIS — K029 Dental caries, unspecified: Secondary | ICD-10-CM

## 2021-07-03 DIAGNOSIS — K011 Impacted teeth: Secondary | ICD-10-CM

## 2021-07-03 DIAGNOSIS — K036 Deposits [accretions] on teeth: Secondary | ICD-10-CM

## 2021-07-03 DIAGNOSIS — I351 Nonrheumatic aortic (valve) insufficiency: Secondary | ICD-10-CM | POA: Diagnosis not present

## 2021-07-03 DIAGNOSIS — Z01818 Encounter for other preprocedural examination: Secondary | ICD-10-CM

## 2021-07-03 DIAGNOSIS — I358 Other nonrheumatic aortic valve disorders: Secondary | ICD-10-CM | POA: Diagnosis not present

## 2021-07-03 DIAGNOSIS — D649 Anemia, unspecified: Secondary | ICD-10-CM | POA: Diagnosis not present

## 2021-07-03 DIAGNOSIS — L0591 Pilonidal cyst without abscess: Secondary | ICD-10-CM | POA: Diagnosis not present

## 2021-07-03 DIAGNOSIS — L732 Hidradenitis suppurativa: Secondary | ICD-10-CM | POA: Diagnosis not present

## 2021-07-03 DIAGNOSIS — I330 Acute and subacute infective endocarditis: Secondary | ICD-10-CM | POA: Diagnosis not present

## 2021-07-03 HISTORY — PX: PILONIDAL CYST DRAINAGE: SHX743

## 2021-07-03 LAB — BASIC METABOLIC PANEL
Anion gap: 10 (ref 5–15)
BUN: 14 mg/dL (ref 6–20)
CO2: 26 mmol/L (ref 22–32)
Calcium: 8.6 mg/dL — ABNORMAL LOW (ref 8.9–10.3)
Chloride: 103 mmol/L (ref 98–111)
Creatinine, Ser: 0.79 mg/dL (ref 0.61–1.24)
GFR, Estimated: >60 mL/min (ref >60)
Glucose, Bld: 107 mg/dL — ABNORMAL HIGH (ref 70–99)
Potassium: 3.9 mmol/L (ref 3.5–5.1)
Sodium: 139 mmol/L (ref 135–145)

## 2021-07-03 LAB — CULTURE, BLOOD (ROUTINE X 2)
Culture: NO GROWTH
Culture: NO GROWTH
Special Requests: ADEQUATE

## 2021-07-03 LAB — CBC
HCT: 38.8 % — ABNORMAL LOW (ref 39.0–52.0)
Hemoglobin: 12.8 g/dL — ABNORMAL LOW (ref 13.0–17.0)
MCH: 26.3 pg (ref 26.0–34.0)
MCHC: 33 g/dL (ref 30.0–36.0)
MCV: 79.8 fL — ABNORMAL LOW (ref 80.0–100.0)
Platelets: 324 10*3/uL (ref 150–400)
RBC: 4.86 MIL/uL (ref 4.22–5.81)
RDW: 13.9 % (ref 11.5–15.5)
WBC: 8.9 10*3/uL (ref 4.0–10.5)
nRBC: 0 % (ref 0.0–0.2)

## 2021-07-03 LAB — VANCOMYCIN, TROUGH: Vancomycin Tr: 7 ug/mL — ABNORMAL LOW (ref 15–20)

## 2021-07-03 LAB — URINE CULTURE: Culture: NO GROWTH

## 2021-07-03 SURGERY — EXCISION, PILONIDAL CYST
Anesthesia: General | Site: Back

## 2021-07-03 MED ORDER — HEPARIN SODIUM (PORCINE) 5000 UNIT/ML IJ SOLN
5000.0000 [IU] | Freq: Four times a day (QID) | INTRAMUSCULAR | Status: DC
  Administered 2021-07-03 – 2021-07-05 (×5): 5000 [IU] via SUBCUTANEOUS
  Filled 2021-07-03 (×6): qty 1

## 2021-07-03 MED ORDER — ROCURONIUM BROMIDE 10 MG/ML (PF) SYRINGE
PREFILLED_SYRINGE | INTRAVENOUS | Status: DC | PRN
  Administered 2021-07-03: 70 mg via INTRAVENOUS

## 2021-07-03 MED ORDER — LIDOCAINE 2% (20 MG/ML) 5 ML SYRINGE
INTRAMUSCULAR | Status: DC | PRN
  Administered 2021-07-03: 100 mg via INTRAVENOUS

## 2021-07-03 MED ORDER — BUPIVACAINE-EPINEPHRINE (PF) 0.25% -1:200000 IJ SOLN
INTRAMUSCULAR | Status: AC
  Filled 2021-07-03: qty 30

## 2021-07-03 MED ORDER — FENTANYL CITRATE (PF) 250 MCG/5ML IJ SOLN
INTRAMUSCULAR | Status: DC | PRN
Start: 2021-07-03 — End: 2021-07-03
  Administered 2021-07-03: 50 ug via INTRAVENOUS
  Administered 2021-07-03 (×2): 100 ug via INTRAVENOUS

## 2021-07-03 MED ORDER — SUGAMMADEX SODIUM 200 MG/2ML IV SOLN
INTRAVENOUS | Status: DC | PRN
Start: 2021-07-03 — End: 2021-07-03
  Administered 2021-07-03: 250 mg via INTRAVENOUS

## 2021-07-03 MED ORDER — ACETAMINOPHEN 500 MG PO TABS: 1000.0000 mg | ORAL_TABLET | Freq: Once | ORAL | Status: AC

## 2021-07-03 MED ORDER — LIDOCAINE 2% (20 MG/ML) 5 ML SYRINGE
INTRAMUSCULAR | Status: AC
  Filled 2021-07-03: qty 5

## 2021-07-03 MED ORDER — VANCOMYCIN HCL 1500 MG/300ML IV SOLN
1500.0000 mg | Freq: Two times a day (BID) | INTRAVENOUS | Status: DC
Start: 2021-07-03 — End: 2021-07-05
  Administered 2021-07-03 – 2021-07-04 (×3): 1500 mg via INTRAVENOUS
  Filled 2021-07-03 (×4): qty 300

## 2021-07-03 MED ORDER — ROCURONIUM BROMIDE 10 MG/ML (PF) SYRINGE
PREFILLED_SYRINGE | INTRAVENOUS | Status: AC
  Filled 2021-07-03: qty 10

## 2021-07-03 MED ORDER — ONDANSETRON HCL 4 MG/2ML IJ SOLN
INTRAMUSCULAR | Status: DC | PRN
Start: 2021-07-03 — End: 2021-07-03
  Administered 2021-07-03: 4 mg via INTRAVENOUS

## 2021-07-03 MED ORDER — VANCOMYCIN HCL 1500 MG/300ML IV SOLN
1500.0000 mg | Freq: Two times a day (BID) | INTRAVENOUS | Status: AC
  Administered 2021-07-03: 1500 mg via INTRAVENOUS
  Filled 2021-07-03 (×2): qty 300

## 2021-07-03 MED ORDER — KETOROLAC TROMETHAMINE 30 MG/ML IJ SOLN
INTRAMUSCULAR | Status: AC
  Filled 2021-07-03: qty 1

## 2021-07-03 MED ORDER — FENTANYL CITRATE (PF) 250 MCG/5ML IJ SOLN
INTRAMUSCULAR | Status: AC
  Filled 2021-07-03: qty 5

## 2021-07-03 MED ORDER — DEXMEDETOMIDINE (PRECEDEX) IN NS 20 MCG/5ML (4 MCG/ML) IV SYRINGE
PREFILLED_SYRINGE | INTRAVENOUS | Status: DC | PRN
  Administered 2021-07-03 (×2): 8 ug via INTRAVENOUS
  Administered 2021-07-03: 4 ug via INTRAVENOUS

## 2021-07-03 MED ORDER — KETOROLAC TROMETHAMINE 30 MG/ML IJ SOLN
INTRAMUSCULAR | Status: DC | PRN
  Administered 2021-07-03: 30 mg via INTRAVENOUS

## 2021-07-03 MED ORDER — ACETAMINOPHEN 500 MG PO TABS
ORAL_TABLET | ORAL | Status: AC
  Administered 2021-07-03: 1000 mg via ORAL
  Filled 2021-07-03: qty 2

## 2021-07-03 MED ORDER — MIDAZOLAM HCL 5 MG/5ML IJ SOLN
INTRAMUSCULAR | Status: DC | PRN
  Administered 2021-07-03: 2 mg via INTRAVENOUS

## 2021-07-03 MED ORDER — HYDROMORPHONE HCL 1 MG/ML IJ SOLN: 1.0000 mg | INTRAMUSCULAR | Status: DC | PRN

## 2021-07-03 MED ORDER — MIDAZOLAM HCL 2 MG/2ML IJ SOLN
INTRAMUSCULAR | Status: AC
  Filled 2021-07-03: qty 2

## 2021-07-03 MED ORDER — ONDANSETRON HCL 4 MG/2ML IJ SOLN
INTRAMUSCULAR | Status: AC
  Filled 2021-07-03: qty 2

## 2021-07-03 MED ORDER — LACTATED RINGERS IV SOLN
INTRAVENOUS | Status: DC
Start: 2021-07-03 — End: 2021-07-05

## 2021-07-03 MED ORDER — PROPOFOL 10 MG/ML IV BOLUS
INTRAVENOUS | Status: DC | PRN
  Administered 2021-07-03: 200 mg via INTRAVENOUS

## 2021-07-03 MED ORDER — DEXAMETHASONE SODIUM PHOSPHATE 10 MG/ML IJ SOLN
INTRAMUSCULAR | Status: AC
  Filled 2021-07-03: qty 1

## 2021-07-03 MED ORDER — CHLORHEXIDINE GLUCONATE 0.12 % MT SOLN
15.0000 mL | OROMUCOSAL | Status: AC
  Administered 2021-07-03: 15 mL via OROMUCOSAL
  Filled 2021-07-03: qty 15

## 2021-07-03 MED ORDER — DEXAMETHASONE SODIUM PHOSPHATE 10 MG/ML IJ SOLN
INTRAMUSCULAR | Status: DC | PRN
  Administered 2021-07-03: 5 mg via INTRAVENOUS

## 2021-07-03 MED ORDER — DEXMEDETOMIDINE (PRECEDEX) IN NS 20 MCG/5ML (4 MCG/ML) IV SYRINGE
PREFILLED_SYRINGE | INTRAVENOUS | Status: AC
  Filled 2021-07-03: qty 5

## 2021-07-03 MED ORDER — CHLORHEXIDINE GLUCONATE 0.12 % MT SOLN
OROMUCOSAL | Status: AC
  Filled 2021-07-03: qty 15

## 2021-07-03 MED ORDER — BUPIVACAINE-EPINEPHRINE (PF) 0.25% -1:200000 IJ SOLN
INTRAMUSCULAR | Status: DC | PRN
  Administered 2021-07-03: 20 mL

## 2021-07-03 MED ORDER — VANCOMYCIN HCL 1500 MG/300ML IV SOLN
1500.0000 mg | Freq: Two times a day (BID) | INTRAVENOUS | Status: DC
  Filled 2021-07-03: qty 300

## 2021-07-03 MED ORDER — PROPOFOL 10 MG/ML IV BOLUS
INTRAVENOUS | Status: AC
  Filled 2021-07-03: qty 20

## 2021-07-03 MED ORDER — OXYCODONE HCL 5 MG PO TABS
5.0000 mg | ORAL_TABLET | ORAL | Status: DC | PRN
  Administered 2021-07-03 – 2021-07-04 (×5): 5 mg via ORAL
  Filled 2021-07-03 (×5): qty 1

## 2021-07-03 SURGICAL SUPPLY — 34 items
BAG COUNTER SPONGE SURGICOUNT (BAG) ×2 IMPLANT
BLADE CLIPPER SURG (BLADE) IMPLANT
CANISTER SUCT 3000ML PPV (MISCELLANEOUS) IMPLANT
COVER SURGICAL LIGHT HANDLE (MISCELLANEOUS) ×2 IMPLANT
DRAPE DERMATAC (DRAPES) ×1 IMPLANT
DRAPE LAPAROTOMY T 102X78X121 (DRAPES) ×2 IMPLANT
DRSG TEGADERM 2-3/8X2-3/4 SM (GAUZE/BANDAGES/DRESSINGS) ×2 IMPLANT
DRSG VAC ATS MED SENSATRAC (GAUZE/BANDAGES/DRESSINGS) ×1 IMPLANT
ELECT REM PT RETURN 9FT ADLT (ELECTROSURGICAL) ×2
ELECTRODE REM PT RTRN 9FT ADLT (ELECTROSURGICAL) ×1 IMPLANT
GAUZE SPONGE 4X4 12PLY STRL (GAUZE/BANDAGES/DRESSINGS) ×2 IMPLANT
GLOVE SURG SIGNA 7.5 PF LTX (GLOVE) ×4 IMPLANT
GOWN STRL REUS W/ TWL LRG LVL3 (GOWN DISPOSABLE) ×1 IMPLANT
GOWN STRL REUS W/ TWL XL LVL3 (GOWN DISPOSABLE) ×1 IMPLANT
GOWN STRL REUS W/TWL LRG LVL3 (GOWN DISPOSABLE) ×1
GOWN STRL REUS W/TWL XL LVL3 (GOWN DISPOSABLE) ×1
GRAFT MYRIAD 3 LAYER 10X10 (Graft) ×1 IMPLANT
KIT BASIN OR (CUSTOM PROCEDURE TRAY) ×2 IMPLANT
KIT TURNOVER KIT B (KITS) ×2 IMPLANT
MARKER SKIN DUAL TIP RULER LAB (MISCELLANEOUS) ×1 IMPLANT
NDL HYPO 25GX1X1/2 BEV (NEEDLE) IMPLANT
NEEDLE HYPO 25GX1X1/2 BEV (NEEDLE) IMPLANT
NS IRRIG 1000ML POUR BTL (IV SOLUTION) ×2 IMPLANT
PACK GENERAL/GYN (CUSTOM PROCEDURE TRAY) ×2 IMPLANT
PAD ARMBOARD 7.5X6 YLW CONV (MISCELLANEOUS) ×4 IMPLANT
PENCIL SMOKE EVACUATOR (MISCELLANEOUS) ×2 IMPLANT
POWDER MYRIAD MORCELLS 1000MG (Miscellaneous) ×1 IMPLANT
SPECIMEN JAR SMALL (MISCELLANEOUS) IMPLANT
SPONGE T-LAP 18X18 ~~LOC~~+RFID (SPONGE) ×1 IMPLANT
SUT ETHILON 2 0 FS 18 (SUTURE) ×5 IMPLANT
SUT VIC AB 3-0 SH 8-18 (SUTURE) ×2 IMPLANT
TOWEL GREEN STERILE (TOWEL DISPOSABLE) ×2 IMPLANT
TOWEL GREEN STERILE FF (TOWEL DISPOSABLE) ×2 IMPLANT
UNDERPAD 30X36 HEAVY ABSORB (UNDERPADS AND DIAPERS) ×2 IMPLANT

## 2021-07-03 NOTE — Progress Notes (Addendum)
Day of Surgery Procedure(s) (LRB): EXCISIONAL DEBRIDEMENT CHRONIC PILONIDAL TRACT (N/A) Subjective: Patient in PACU after excisional debridement of extensive indolent pilonidal tissue with wound VAC placement.  I discussed the cardiac CT images showing sub annular abscess under noncoronary leaflet with vegetations of the leaflet base with Dr Audie Box for coordination of care  Objective: Vital signs in last 24 hours: Temp:  [97.9 F (36.6 C)-98.9 F (37.2 C)] 98.9 F (37.2 C) (02/22 1035) Pulse Rate:  [74-96] 89 (02/22 1035) Cardiac Rhythm: Normal sinus rhythm (02/22 1035) Resp:  [12-20] 13 (02/22 1035) BP: (112-164)/(58-73) 133/71 (02/22 1035) SpO2:  [95 %-99 %] 95 % (02/22 1035) Weight:  [108.9 kg] 108.9 kg (02/22 0756)  Hemodynamic parameters for last 24 hours:    Intake/Output from previous day: 02/21 0701 - 02/22 0700 In: 525 [P.O.:125; IV Piggyback:400] Out: -  Intake/Output this shift: Total I/O In: 600 [I.V.:300; IV Piggyback:300] Out: 15 [Blood:15]  Nsr Blood pressure 133/71, pulse 89, temperature 98.9 F (37.2 C), resp. rate 13, height 5\' 11"  (1.803 m), weight 108.9 kg, SpO2 95 %.   Lab Results: Recent Labs    07/02/21 0459 07/03/21 0605  WBC 11.3* 8.9  HGB 12.1* 12.8*  HCT 36.9* 38.8*  PLT 331 324   BMET:  Recent Labs    07/02/21 0459 07/03/21 0605  NA 136 139  K 3.6 3.9  CL 104 103  CO2 22 26  GLUCOSE 101* 107*  BUN 12 14  CREATININE 0.71 0.79  CALCIUM 8.5* 8.6*    PT/INR: No results for input(s): LABPROT, INR in the last 72 hours. ABG No results found for: PHART, HCO3, TCO2, ACIDBASEDEF, O2SAT CBG (last 3)  No results for input(s): GLUCAP in the last 72 hours.  Assessment/Plan: S/P Procedure(s) (LRB): EXCISIONAL DEBRIDEMENT CHRONIC PILONIDAL TRACT (N/A) Continue Iv antibiotics Plan Homograft reconstruction of aortic root 2/24, 48 hrs  after removal of indolent infection in the sacral soft tissue    LOS: 6 days    Dahlia Byes 07/03/2021  4:30 pm   I discussed the results of the cardiac CT showing an aortic root abscess with the patient and family. They understand that because the infection is more extensive and penetrates deeper into his heart tissue that a full aortic root replacement will be needed using a human allograft [homograft]. They understand that this will require a longer procedure and entail greater risk than a single valve replacement. Surgery set for Friday am.

## 2021-07-03 NOTE — Progress Notes (Addendum)
Family Medicine Teaching Service Daily Progress Note Intern Pager: [phone]  Patient name: Martin Stafford Medical record number: [id number] Date of birth: 01-04-1991 Age: 31 y.o. Gender: male  Primary Care Provider: Patient, No Pcp Per (Inactive) Consultants: Cardiology, ID, CT surgery Code Status: Full  Pt Overview and Major Events to Date:  2/16- admitted 2/20- TEE showing aortic valve endocarditis  Assessment and Plan: Martin Stafford is a 31yo male with no significant medical history who presented with L-sided chest pain, subsequently found to have endocarditis and a possible abscess at the aortic root.   Aortic Valve Endocarditis VSS overnight. CT Surgery has determined that he will need Aortic valve replacement which has been scheduled for Friday 2/24. 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 He is scheduled for the OR today for this procedure. Dentistry has also been consulted to rule out possible dental source of infection but has not yet evaluated the patient--reassuringly, his orthopantogram was normal. WBC improved today 11.3>8.9.  CT coronary yesterday did not show any evidence of coronary disease and confirmed both a mobile density of the NCC consistent with vegetation, thickening at the base of the [facility] consistent with vegetation and a psudoaneurysm of the LVOT under the [place] consistent with aortic root abscess. Blood cx remain NGTD. Plan is to send valve tissue off for 16S RNA sequencing in hopes of identifying pathogen.  - Surgery today to excise pilonidal tract - OR on Friday for aortic valve replacement - CT surgery and Gen surg following - ID following, continue vanc + ceftriaxone - F/u dentistry recs - Discussed care with Dr. Prescott Gum, CT Surgery, who recommended d/c colchicine and ibuprofen and starting pt on heparin 5000 u q6h   Pilonidal Cyst Superficial  wound culture with rare gram neg rods, seems unlikely to be the ultimate source of infection.  - To OR with general surgery today as above - Will have Wound VAC placed post-operatively  FEN/GI: NPO for surgery PPx: Heparin 5000u q6h per CT surgery recommendation Dispo: Pending further workup and intervention     Subjective:  Martin Stafford reports feeling generally well this morning. He is somewhat anxious about his upcoming procedures but expresses confidence in the medical team. He has no acute complaints. Denies fever/chills/rigors in the past 24 hours.   Objective: Temp:  [97.9 F (36.6 C)-98.3 F (36.8 C)] 97.9 F (36.6 C) (02/21 2110) Pulse Rate:  [79-82] 79 (02/21 2110) Resp:  [14-16] 16 (02/21 2110) BP: (112-126)/(60-66) 126/60 (02/21 2110) SpO2:  [97 %-98 %] 98 % (02/21 2110) Physical Exam: General: Awake, ambulating in room, NAD Cardiovascular: RRR, systolic and diastolic murmur Respiratory: Lungs CTAB, normal WOB on RA Abdomen: Soft, non-tender, non-distended Extremities: Without edema or deformity   Laboratory: Recent Labs  Lab 07/01/21 0132 07/02/21 0459 07/03/21 0605  WBC 9.9 11.3* 8.9  HGB 11.9* 12.1* 12.8*  HCT 36.8* 36.9* 38.8*  PLT 304 331 324   Recent Labs  Lab 06/27/21 0547 06/28/21 0233 06/30/21 0253 07/01/21 0132 07/02/21 0459  NA 134*   < > 137 137 136  K 4.0   < > 3.5 3.3* 3.6  CL 98   < > 103 103 104  CO2 25   < > 25 25 22   BUN 11   < > 12 10 12   CREATININE 0.91   < > 0.85 0.82 0.71  CALCIUM 9.2   < >  8.5* 8.5* 8.5*  PROT 8.4*  --   --   --   --   BILITOT 0.7  --   --   --   --   ALKPHOS 50  --   --   --   --   ALT 24  --   --   --   --   AST 25  --   --   --   --   GLUCOSE 110*   < > 105* 99 101*   < > = values in this interval not displayed.    Imaging/Diagnostic Tests: CT CORONARY MORPH W/CTA COR W/SCORE W/CA W/CM &/OR WO/CM Addendum: ADDENDUM REPORT: 07/02/2021 16:02   CLINICAL DATA:  Severe aortic regurgitation.  Preoperative  assessment.   EXAM:  Cardiac/Coronary CTA   TECHNIQUE:  A non-contrast, gated CT scan was obtained with axial slices of 3 mm  through the heart for calcium scoring. Calcium scoring was performed  using the Agatston method. A 100 kV prospective, gated, contrast  cardiac scan was obtained. Gantry rotation speed was 250 msecs and  collimation was 0.6 mm. Two sublingual nitroglycerin tablets (0.8  mg) were given. The 3D data set was reconstructed in 5% intervals of  the 35-75% of the R-R cycle. Diastolic phases were analyzed on a  dedicated workstation using MPR, MIP, and VRT modes. The patient  received 95 cc of contrast.   FINDINGS:  Image quality: Excellent.  Mild motion artifact.   Noise artifact is: Limited.   Aortic valve: There is a mobile density attached to the base of the  [place] that likely represents vegetation given the clinical history.  There is also thickening of the base of the LCC likely consistent  with vegetation. There is a small pocket of contrast extravasation  under the [place] in the LVOT consistent with pseudoaneurysm. Findings  are consistent with abscess of the aortic root/LVOT. There is  incomplete coaptation of the aortic valve leaflets consistent with  at least moderate AI. There is also a diverticulum of the basal  septum, which could represent extension of infection.   Coronary Arteries:  Normal coronary origin.  Right dominance.   Left main: The left main is a large caliber vessel with a normal  take off from the left coronary cusp that bifurcates to form a left  anterior descending artery and a left circumflex artery. There is no  plaque or stenosis.   Left anterior descending artery: The LAD is patent without evidence  of plaque or stenosis. Mid LAD myocardial bridge (normal variant).  The LAD gives off 2 patent diagonal branches.   Left circumflex artery: The LCX is non-dominant and patent with no  evidence of plaque or stenosis. The  LCX gives off 2 patent obtuse  marginal branches.   Right coronary artery: The RCA is dominant with normal take off from  the right coronary cusp. There is no evidence of plaque or stenosis.  The RCA terminates as a PDA and right posterolateral branch without  evidence of plaque or stenosis.   Right Atrium: Right atrial size is within normal limits.   Right Ventricle: The right ventricular cavity is within normal  limits.   Left Atrium: Left atrial size is normal in size with no left atrial  appendage filling defect.   Left Ventricle: The ventricular cavity size is within normal limits.  There are no stigmata of prior infarction. There is no abnormal  filling defect.   Pulmonary arteries: Normal in  size without proximal filling defect.   Pulmonary veins: Normal pulmonary venous drainage.   Pericardium: Normal thickness. Small circumferential pericardial  effusion.   Cardiac valves: The aortic valve is trileaflet without significant  calcification. The mitral valve is normal structure without  significant calcification.   Aorta: Normal caliber with no significant disease.   Extra-cardiac findings: See attached radiology report for  non-cardiac structures.   IMPRESSION:  1. Coronary calcium score of 0.   2. Normal coronary origin with right dominance.   3. Normal coronary arteries.   4. Mobile density on the [place] consistent with vegetation.   5. Thickening at the base of the [place] consistent with vegetation.   6. Pseudoaneurysm of the LVOT under the [place] consistent with aortic  root/LVOT abscess.   7. Diverticulum of the basal septum.   8. Mid LAD myocardial bridge (normal variant).   Eleonore Chiquito, MD   Electronically Signed    By: Eleonore Chiquito M.D.    On: 07/02/2021 16:02 Narrative: EXAM: OVER-READ INTERPRETATION  CT CHEST  The following report is an over-read performed by radiologist Dr. Aletta Edouard of [facility], [place] on 07/02/2021.  This over-read does not include interpretation of cardiac or coronary anatomy or pathology. The coronary CTA interpretation by the cardiologist is attached.  COMPARISON:  CT a of the chest on 04/26/2022  FINDINGS: Vascular: No significant noncardiac vascular findings.  Mediastinum/Nodes: Visualized mediastinum and hilar regions demonstrate no lymphadenopathy or masses.  Lungs/Pleura: Visualized lungs show no evidence of pulmonary edema, consolidation, pneumothorax, nodule or pleural fluid.  Upper Abdomen: No acute abnormality.  Musculoskeletal: No chest wall mass or suspicious bone lesions identified.  IMPRESSION: No significant incidental findings.  Electronically Signed: By: Aletta Edouard M.D. On: 07/02/2021 15:10 VAS US DOPPLER PRE CABG PREOPERATIVE VASCULAR EVALUATION  Patient Name:  BRYTEN SHURTS  Date of Exam:   07/02/2021 Medical Rec #: [id number]          Accession #:    [id number] Date of Birth: 07-22-1990          Patient Gender: M Patient Age:   4 years Exam Location:  [facility] Procedure:      VAS US DOPPLER PRE CABG Referring Phys: Collier Salina VANTRIGT  --------------------------------------------------------------------------------   Indications:      Preop AVR. Comparison Study: no prior  Performing Technologist: Archie Patten RVS    Examination Guidelines: A complete evaluation includes B-mode imaging, spectral Doppler, color Doppler, and power Doppler as needed of all accessible portions of each vessel. Bilateral testing is considered an integral part of a complete examination. Limited examinations for reoccurring indications may be performed as noted.    Right Carotid Findings: +----------+--------+--------+--------+------------+--------+             PSV cm/s EDV cm/s Stenosis Describe     Comments  +----------+--------+--------+--------+------------+--------+  CCA Prox   127                        heterogenous            +----------+--------+--------+--------+------------+--------+  CCA Distal 112      10                heterogenous           +----------+--------+--------+--------+------------+--------+  ICA Prox   99       22                heterogenous           +----------+--------+--------+--------+------------+--------+  ICA Distal 97       24                                       +----------+--------+--------+--------+------------+--------+  ECA        96                                                +----------+--------+--------+--------+------------+--------+  +----------+--------+-------+--------+------------+             PSV cm/s EDV cms Describe Arm Pressure  +----------+--------+-------+--------+------------+  Subclavian 114                                     +----------+--------+-------+--------+------------+  +---------+--------+--+--------+--+---------+  Vertebral PSV cm/s 51 EDV cm/s 12 Antegrade  +---------+--------+--+--------+--+---------+  Left Carotid Findings: +----------+--------+--------+--------+------------+--------+             PSV cm/s EDV cm/s Stenosis Describe     Comments  +----------+--------+--------+--------+------------+--------+  CCA Prox   137      14                heterogenous           +----------+--------+--------+--------+------------+--------+  CCA Distal 95       13                heterogenous           +----------+--------+--------+--------+------------+--------+  ICA Prox   72       19                heterogenous           +----------+--------+--------+--------+------------+--------+  ICA Distal 96       29                                       +----------+--------+--------+--------+------------+--------+  ECA        115                                               +----------+--------+--------+--------+------------+--------+  +----------+--------+--------+--------+------------+  Subclavian PSV cm/s EDV cm/s Describe Arm  Pressure  +----------+--------+--------+--------+------------+             137                                      +----------+--------+--------+--------+------------+  +---------+--------+--+--------+--+---------+  Vertebral PSV cm/s 77 EDV cm/s 11 Antegrade  +---------+--------+--+--------+--+---------+    ABI Findings: +--------+------------------+-----+---------+--------+  Right    Rt Pressure (mmHg) Index Waveform  Comment   +--------+------------------+-----+---------+--------+  Brachial                          triphasic           +--------+------------------+-----+---------+--------+  +--------+------------------+-----+---------+-------+  Left     Lt Pressure (mmHg) Index Waveform  Comment  +--------+------------------+-----+---------+-------+  Brachial  triphasic          +--------+------------------+-----+---------+-------+    Right Doppler Findings: +--------+--------+-----+---------+--------+  Site     Pressure Index Doppler   Comments  +--------+--------+-----+---------+--------+  Brachial                triphasic           +--------+--------+-----+---------+--------+  Radial                  triphasic           +--------+--------+-----+---------+--------+  Ulnar                   triphasic           +--------+--------+-----+---------+--------+     Left Doppler Findings: +--------+--------+-----+---------+--------+  Site     Pressure Index Doppler   Comments  +--------+--------+-----+---------+--------+  Brachial                triphasic           +--------+--------+-----+---------+--------+  Radial                  triphasic           +--------+--------+-----+---------+--------+  Ulnar                   triphasic           +--------+--------+-----+---------+--------+     Summary: Right Carotid: The extracranial vessels were near-normal with only minimal wall                thickening or plaque.  Left Carotid: The  extracranial vessels were near-normal with only minimal wall               thickening or plaque. Vertebrals: Bilateral vertebral arteries demonstrate antegrade flow.  Right Upper Extremity: Doppler waveforms remain within normal limits with right radial compression. Doppler waveforms remain within normal limits with right ulnar compression. Left Upper Extremity: Doppler waveforms remain within normal limits with left radial compression. Doppler waveforms remain within normal limits with left ulnar compression.    Electronically signed by Servando Snare MD on 07/02/2021 at 11:16:35 AM.      Final      Eppie Gibson, MD 07/03/2021, 6:35 AM PGY-1, [place] Intern pager: [phone], text pages welcome

## 2021-07-03 NOTE — Progress Notes (Signed)
°   07/03/21 1530  Clinical Encounter Type  Visited With Patient and family together  Visit Type Pre-op;Initial  Referral From Nurse  Consult/Referral To Chaplain   Chaplain Melvenia Beam met with Mr.Monroe R. Solano, his fiance, and mother at patient's bedside regarding the completion of Advance Directive. Patient acknowledged that preparing A.D. was something important that he would like to have completed. Chaplain explained that A.D. is only used when the patient cannot make medical decisions about medical care for himself, giving instruction to the doctors and his agents of how he wishes to be cared.   Patient stated that he has at least three people that he would consider as his Medical Power of Attorney.    Chaplain explained the importance of having conversation with them prior to naming them as his agents.    Chaplain provided patient a blank copy and provided patient instruction for the completion of Parts A and B of the A.D. Chaplain instructed patient to have the nurse contact [place] when he has completed Parts A and B, so that we can arrange for Notary and witnesses to assist with completion of Part C.    Patient was appreciative of the consultation. Chaplain will follow up with patient to arrange for witnesses and Notary assistance. Chaplain [place], [place]. may be contacted at [phone] for further assistance.

## 2021-07-03 NOTE — Op Note (Signed)
Martin Stafford 06/27/2021 - 07/03/2021   Pre-op Diagnosis: Draining recurrent pilonidal cyst     Post-op Diagnosis: same  Procedure(s):EXCISION PILONIDAL CYST PLACEMENT OF MYRIAD THIN GRAFT MATRIX AND GRAFT POWDER PLACEMENT OF WOUND VAC   Surgeon(s): Coralie Keens, MD Carlena Hurl, PA-C  Anesthesia: General  Staff:  Circulator: Maurene Capes, RN; Merry Proud, RN; Kandra Nicolas, RN Scrub Person: Verlene Mayer  Estimated Blood Loss: Minimal               Indications: This is a 31 year old gentleman who has a recurrent draining pilonidal cyst.  He has had extensive surgery in the past at the gluteal cleft including skin grafting.  He continued to have draining sinus tracts which are worrisome for chronic infection.  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 A 10 cm X 10 cm thin Myriad Matrix graft and 1000 mg of Myriad Morcells graft power was used  Procedure: The patient is brought to the operating room and identifies correct patient.  He was on the stretcher beside the operating table.  General anesthesia was induced.  He was then turned into a prone position on the operating room table.  His gluteal cleft area was then prepped and draped in usual sterile fashion.  He had multiple draining sinus tracts at the midline as well as just to the left of the midline.  There was old scarring and obvious previous skin grafting visible.  Using a #10 blade I performed a wide excision of the draining sinus tracts.  I then dissected down circumferentially with the cautery into the subcutaneous tissue.  I encountered extensive granulation tissue going in multiple directions  underneath the old skin graft and previous repair.  There was some thick fluid in the area as well which was only mild.  I excised all of the granulation tissue with electrocautery in all areas. After excision, the wound measured 11 cm x 7 cm x 4 cm in depth.  We thoroughly irrigated with normal saline.  Hemostasis was achieved with the cautery.  I then injected Marcaine into the wound circumferentially.  Next, 1000 g of Myriad Morcells powder was placed into the depths of the wound into the undermined areas.  Next a 10 cm x 10 cm thin Myriad Matrix graft was brought onto the field.  It was soaked in saline.  I then placed it into the large wound and sutured in place circumferentially with interrupted 3-0 Vicryl sutures.  The graft appeared scared.  I then closed the incision over the top of the graft with horizontal mattress 2-0 nylon sutures as well as interrupted 2-0 nylon sutures.  Jelly was then placed over the sutures as well as Adaptic.  The wound VAC black sponge was then placed over the incision and then sealed in place.  The VAC tubing was then applied to the wound VAC and a good seal appeared to be achieved.  The patient tolerated the procedure well.  All the counts were correct at the end of the procedure.  The patient was then turned back into the supine position.  He was then extubated in the operating room and taken in stable condition to the recovery  room.          Coralie Keens   Date: 07/03/2021  Time: 9:55 AM

## 2021-07-03 NOTE — Transfer of Care (Signed)
Immediate Anesthesia Transfer of Care Note  Patient: Martin Stafford  Procedure(s) Performed: EXCISIONAL DEBRIDEMENT CHRONIC PILONIDAL TRACT (Back)  Patient Location: PACU  Anesthesia Type:General  Level of Consciousness: drowsy and patient cooperative  Airway & Oxygen Therapy: Patient Spontanous Breathing and Patient connected to nasal cannula oxygen  Post-op Assessment: Report given to RN and Post -op Vital signs reviewed and stable  Post vital signs: Reviewed and stable  Last Vitals:  Vitals Value Taken Time  BP 130/69 07/03/21 1002  Temp    Pulse 100 07/03/21 1006  Resp 20 07/03/21 1006  SpO2 95 % 07/03/21 1006  Vitals shown include unvalidated device data.  Last Pain:  Vitals:   07/03/21 0756  TempSrc: Oral  PainSc:       Patients Stated Pain Goal: 0 (07/02/21 0016)  Complications: No notable events documented.

## 2021-07-03 NOTE — Progress Notes (Addendum)
Patient ID: Martin Stafford, male   DOB: 14-Jun-1990, 31 y.o.   MRN: [id number]   Pre Procedure note for inpatients:   Martin Stafford has been scheduled for Procedure(s): EXCISIONAL DEBRIDEMENT CHRONIC PILONIDAL TRACT (N/A) today. The various methods of treatment have been discussed with the patient. After consideration of the risks, benefits and treatment options the patient has consented to the planned procedure.   The patient has been seen and labs reviewed. There are no changes in the patients condition to prevent proceeding with the planned procedure today.  Recent labs:  Lab Results  Component Value Date   WBC 8.9 07/03/2021   HGB 12.8 (L) 07/03/2021   HCT 38.8 (L) 07/03/2021   PLT 324 07/03/2021   GLUCOSE 107 (H) 07/03/2021   ALT 24 06/27/2021   AST 25 06/27/2021   NA 139 07/03/2021   K 3.9 07/03/2021   CL 103 07/03/2021   CREATININE 0.79 07/03/2021   BUN 14 07/03/2021   CO2 26 07/03/2021   TSH 1.260 07/02/2021    Abigail Miyamoto, MD 07/03/2021 8:14 AM

## 2021-07-03 NOTE — Care Management (Signed)
°  Transition of Care [facility]) Screening Note   Patient Details  Name: Martin Stafford Date of Birth: [birth year]   Transition of Care [facility]) CM/SW Contact:    Gala Lewandowsky, RN Phone Number: 07/03/2021, 10:35 AM    Transition of Care Department [facility]) has reviewed patient and no TOC needs have been identified at this time. [facility] is following for possible IV antibiotic therapy for home. Case Manager will continue to follow for disposition needs.

## 2021-07-03 NOTE — Anesthesia Procedure Notes (Signed)
Procedure Name: Intubation Date/Time: 07/03/2021 8:52 AM Performed by: Colin Benton, CRNA Pre-anesthesia Checklist: Patient identified, Emergency Drugs available, Suction available and Patient being monitored Patient Re-evaluated:Patient Re-evaluated prior to induction Oxygen Delivery Method: Circle system utilized Preoxygenation: Pre-oxygenation with 100% oxygen Induction Type: IV induction Ventilation: Mask ventilation without difficulty and Oral airway inserted - appropriate to patient size Laryngoscope Size: Mac and 4 Grade View: Grade I Tube type: Oral Tube size: 7.5 mm Number of attempts: 1 Airway Equipment and Method: Stylet Placement Confirmation: ETT inserted through vocal cords under direct vision, positive ETCO2 and breath sounds checked- equal and bilateral Secured at: 24 cm Tube secured with: Tape Dental Injury: Teeth and Oropharynx as per pre-operative assessment

## 2021-07-03 NOTE — Anesthesia Preprocedure Evaluation (Signed)
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 Noncoronary cusp of the aortic valve is abnormally thickened. The basal portion has an almost fenestrated appearance, concerning for abscess formation. Aortic regurgitation is eccentric and severe with backflow from descending aorta. The aortic  valve is tricuspid.   Neuro/Psych negative neurological ROS     GI/Hepatic negative GI ROS, Neg liver ROS,   Endo/Other  negative endocrine ROS  Renal/GU negative Renal ROS  negative genitourinary   Musculoskeletal negative musculoskeletal ROS (+)   Abdominal   Peds  Hematology  (+) Blood dyscrasia, anemia ,   Anesthesia Other Findings   Reproductive/Obstetrics                             Anesthesia Physical Anesthesia Plan  ASA: 4  Anesthesia Plan: General   Post-op Pain Management: Tylenol PO (pre-op)* and Toradol IV (intra-op)*   Induction: Intravenous  PONV Risk Score and Plan: 2 and Ondansetron, Dexamethasone, Treatment may vary due to age or medical condition and Midazolam  Airway Management Planned: Oral ETT  Additional Equipment: None  Intra-op Plan:   Post-operative Plan: Extubation in OR  Informed Consent: I have reviewed the patients History and Physical, chart, labs and discussed the procedure  including the risks, benefits and alternatives for the proposed anesthesia with the patient or authorized representative who has indicated his/her understanding and acceptance.     Dental advisory given  Plan Discussed with:   Anesthesia Plan Comments:         Anesthesia Quick Evaluation

## 2021-07-03 NOTE — Progress Notes (Addendum)
Pharmacy Antibiotic Note  Martin Stafford is a 31 y.o. male admitted on 06/27/2021, now with concern for possible endocarditis.  Pharmacy has been consulted for vancomycin dosing.  There has been no blood culture growth to date. 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 WBC today is 8.9. Patient is afebrile, but taking NSAIDs. He is scheduled for excisional debridement of pilonidal tract today, and a aortic valve replacement on Friday. His serum creatinine is stable at 0.79.   Actual AUC calculated as 425 based on last dose at 2/21 20:12 with peak at 30 ug/mL (2/21 23:11) and  trough at 7 ug/mL.(2/22 07:35)/  Plan: Continue vancomycin 1500 q12h. Goal AUC 400-550. SCr 0.79  Vanc levels when appropriate.    Height: 5\' 11"  (180.3 cm) Weight: 108.9 kg (240 lb) IBW/kg (Calculated) : 75.3  Temp (24hrs), Avg:98.2 F (36.8 C), Min:97.9 F (36.6 C), Max:98.5 F (36.9 C)  Recent Labs  Lab 06/29/21 0227 06/30/21 0253 07/01/21 0132 07/02/21 0459 07/02/21 2311 07/03/21 0605 07/03/21 0735  WBC 9.0 9.4 9.9 11.3*  --  8.9  --   CREATININE 0.68 0.85 0.82 0.71  --  0.79  --   VANCOTROUGH  --   --   --   --   --   --  7*  VANCOPEAK  --   --   --   --  30  --   --         Allergies  Allergen Reactions   Eggs Or Egg-Derived Products     "eggs"   Peanut-Containing Drug Products     "peanuts"    Antimicrobials this admission: Cefepime 2/17 x 1 Ceftriaxone 2/17>> Vancomycin 2/17>>   Microbiology results: 2/16 BCx: NGTD 2/17 Bcx: NGTD   Thank you for allowing pharmacy to be a part of this patients care.  3/17 07/03/2021 9:15 AM

## 2021-07-03 NOTE — Consult Note (Signed)
Department of Dental Medicine   Service Date:   07/03/2021 Admit Date:   06/27/2021  Patient Name:  Martin Stafford Date of Birth:   04-16-1991 Medical Record Number: [id number]  Referring Provider:           Lovett SoxPeter Vantrigt, M.D.   INPATIENT CONSULTATION PLAN/RECOMMENDATIONS   ASSESSMENT There are no current signs of acute odontogenic infection including abscess, edema or erythema, or suspicious lesion requiring biopsy.   #1, #16, #17 & #32 caries; accretions on teeth  RECOMMENDATIONS No dental treatment needed prior to cardiac surgery at this time. Extractions of 3rd molars after surgery & recovery to decrease the risk of postoperative systemic infection and complications.   Establish dental care at an outside office of the patient's choice for routine care including cleanings/periodontal therapy and periodic exams.  PLAN Discuss case with medical team and coordinate treatment as needed.  Refer to oral surgeon for 3rd molar extractions after the patient is medically optimized.   Follow-up as needed.  Discussed in detail all treatment options and recommendations with the patient and they are agreeable to the plan.    Thank you for consulting with [facility] and for the opportunity to participate in this patient's treatment.  Should you have any questions or concerns, please contact the [facility] at [phone].       07/03/2021    CONSULT NOTE:  HISTORY OF PRESENT ILLNESS: Martin Stafford is a very pleasant 31 y.o. male with no significant medical history who is currently admitted for aortic valve endocarditis and is anticipating aortic root replacement on 07/05/21 with Dr. Maren BeachVantrigt.   Hospital dentistry was consulted to complete a medically-necessary dental evaluation as part of the patient's pre-cardiac surgery work-up and to evaluate for any possible source of infection.   DENTAL HISTORY: The patient reports that he used to have a dentist that he  saw regularly in [place], [place], but that it was too far away so he is planning on searching for a dentist in [place] for routine dental care.  He currently denies any dental/orofacial pain or sensitivity. Patient is able to manage oral secretions.  Patient denies dysphagia, odynophagia and dysphonia.   CHIEF COMPLAINT:  Preoperative dental consult.   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   Sepsis (HCC) 06/28/2021   Severe aortic insufficiency    Fever    Chest pain 06/27/2021   Chills    Elevated troponin    Abnormal heart sounds    Skin ulcer of buttock (HCC) 12/12/2011   Hidradenitis 02/12/2011   Past Medical History:  Diagnosis Date   Environmental allergies    Generalized skin cysts    Past Surgical History:  Procedure Laterality Date   COLON SURGERY     TEE WITHOUT CARDIOVERSION N/A 07/01/2021   Procedure: TRANSESOPHAGEAL ECHOCARDIOGRAM (TEE);  Surgeon: Pricilla Riffleoss, Paula V, MD;  Location[facility];  Service: Cardiovascular;  Laterality: N/A;   Allergies  Allergen Reactions   Eggs Or Egg-Derived Products     "eggs"   Peanut-Containing Drug Products     "peanuts"   Current Facility-Administered Medications  Medication Dose Route Frequency Provider Last Rate Last Admin   acetaminophen (TYLENOL) tablet 650 mg  650 mg Oral Q6H PRN Abigail MiyamotoBlackman, Douglas, MD   650 mg at 07/02/21 [id number]   Or   acetaminophen (TYLENOL) suppository 650 mg  650 mg Rectal Q6H PRN Abigail MiyamotoBlackman, Douglas,  MD       cefTRIAXone (ROCEPHIN) 2 g in sodium chloride 0.9 % 100 mL IVPB  2 g Intravenous Q24H Abigail Miyamoto, MD 200 mL/hr at 07/03/21 1807 2 g at 07/03/21 1807   chlorhexidine (HIBICLENS) 4 % liquid   Topical Daily Abigail Miyamoto, MD       heparin injection 5,000 Units  5,000 Units Subcutaneous Q6H Alicia Amel, MD   5,000 Units at 07/03/21 2224   HYDROmorphone (DILAUDID) injection 1 mg  1 mg Intravenous Q2H PRN  Abigail Miyamoto, MD       lactated ringers infusion   Intravenous Continuous Abigail Miyamoto, MD 10 mL/hr at 07/03/21 9163 Continued from Pre-op at 07/03/21 8466   lip balm (CARMEX) ointment   Topical PRN Abigail Miyamoto, MD       oxyCODONE (Oxy IR/ROXICODONE) immediate release tablet 5 mg  5 mg Oral Q4H PRN Abigail Miyamoto, MD   5 mg at 07/03/21 1811   pantoprazole (PROTONIX) EC tablet 40 mg  40 mg Oral Daily Abigail Miyamoto, MD   40 mg at 07/02/21 5993   vancomycin (VANCOREADY) IVPB 1500 mg/300 mL  1,500 mg Intravenous Q12H Carrington Clamp, RPH 150 mL/hr at 07/03/21 2223 1,500 mg at 07/03/21 2223    LABS: Lab Results  Component Value Date   WBC 8.9 07/03/2021   HGB 12.8 (L) 07/03/2021   HCT 38.8 (L) 07/03/2021   MCV 79.8 (L) 07/03/2021   PLT 324 07/03/2021      Component Value Date/Time   NA 139 07/03/2021 0605   K 3.9 07/03/2021 0605   CL 103 07/03/2021 0605   CO2 26 07/03/2021 0605   GLUCOSE 107 (H) 07/03/2021 0605   BUN 14 07/03/2021 0605   CREATININE 0.79 07/03/2021 0605   CALCIUM 8.6 (L) 07/03/2021 0605   GFRNONAA >60 07/03/2021 5701   No results found for: INR, PROTIME No results found for: PTT  Social History   Socioeconomic History   Marital status: Single    Spouse name: Not on file   Number of children: Not on file   Years of education: Not on file   Highest education level: Not on file  Occupational History   Not on file  Tobacco Use   Smoking status: Never   Smokeless tobacco: Never  Vaping Use   Vaping Use: Never used  Substance and Sexual Activity   Alcohol use: No   Drug use: No   Sexual activity: Not on file  Other Topics Concern   Not on file  Social History Narrative   Not on file   Social Determinants of Health   Financial Resource Strain: Not on file  Food Insecurity: Not on file  Transportation Needs: Not on file  Physical Activity: Not on file  Stress: Not on file  Social Connections: Not on file  Intimate Partner  Violence: Not on file   Family History  Problem Relation Age of Onset   Hypertension Father    Hypertension Other    Diabetes Other    Asthma Mother      REVIEW OF SYSTEMS:  Reviewed with the patient as per HPI. PSYCH:  Patient denies having dental phobia.     VITAL SIGNS: BP 134/71 (BP Location: Left Arm)    Pulse 97    Temp 98.1 F (36.7 C) (Oral)    Resp 15    Ht 5\' 11"  (1.803 m)    Wt 108.9 kg    SpO2 95%    BMI  33.47 kg/m      PHYSICAL EXAM: GENERAL:  Well-developed, comfortable and in no apparent distress. NEUROLOGICAL:  Alert and oriented to person, place and  time. EXTRAORAL:  Facial symmetry present without any edema or erythema.  No swelling or lymphadenopathy.  TMJ asymptomatic without clicks or crepitations.  INTRAORAL:  Soft tissues appear well-perfused and mucous membranes moist.  FOM and vestibules soft and not raised. Oral cavity without mass or lesion. No signs of infection, parulis, sinus tract, edema or erythema evident upon exam.     DENTAL EXAM: Clinical findings charted.    OVERALL IMPRESSION:  Good remaining dentition.       ORAL HYGIENE:  Fair  PERIODONTAL:  Pink, healthy gingival tissue with blunted papilla.   Localized calculus accumulation. CARIES:  #1, #16, #17, #32 OCCLUSION:  Class 1 molar occlusion Non-functional teeth:  #16, #17 OTHER FINDINGS:   [+] Impacted teeth:  #17   RADIOGRAPHIC EXAM:   07/01/2021 Orthopantogram reviewed and interpreted.    Condyles seated bilaterally in fossas.  No evidence of abnormal pathology.  All visualized osseous structures appear WNL.     All 32 teeth present.  #17 mesial impaction (partial-bony) & has deep decay approximating the pulp.  #16 & #32 deep decay approximating the pulp.  #17 has radiolucency surrounding crown on mesial where erupting into tooth #18.  Existing restorations evident on #'s 18, 19 & 31.  #1 caries.   ASSESSMENT:  1.  Aortic valve endocarditis 2.  Preoperative dental exam 3.   Caries 4.  Impacted tooth 5.  Accretions on teeth 6.  Gingivitis, plaque-induced 7.  Postoperative bleeding risk   PLAN AND RECOMMENDATIONS: I discussed the risks, benefits, and complications of various scenarios with the patient in relationship to their medical and dental conditions, which included systemic infection such as endocarditis, bacteremia or other serious issues that could potentially occur either before, during or after their anticipated heart surgery if dental/oral concerns are not addressed.  I explained that if any chronic or acute dental/oral infection(s) are addressed and subsequently not maintained following medical optimization and recovery, their risk of the previously mentioned complications are just as high and could potentially occur postoperatively.  I explained all significant findings of the dental consultation with the patient including severe cavities in 3rd molars, especially impacted tooth #17, and the recommended care including extractions of all 3rd molars after his heart surgery and recovery/medical optimization in order to optimize them following heart surgery from a dental standpoint.  The patient verbalized understanding of all findings, discussion, and recommendations. We then discussed that although he does not need to have his 3rd molars extracted prior to his heart surgery, soon after once he is cleared by his medical team, it is important to have this completed to avoid any postoperative complications and pain.  The patient verbalized understanding of all options and is agreeable to the plan. Will assist in oral surgery referral for extractions when the patient is ready to have teeth extracted.  He is aware of this.   Recommend that plans to return to the dentist for routine dental care is discussed with medical team prior for antibiotic prophylaxis recommendations. Plan to discuss all findings and recommendations with medical team and coordinate future care as  needed.   The patient will need to establish care at a dental office of his choice for routine dental care including replacement of missing teeth as needed, cleanings and exams.  All questions and concerns were invited and addressed.  The patient tolerated today's visit well.   Sharman Cheek, D.M.D.

## 2021-07-04 ENCOUNTER — Encounter (HOSPITAL_COMMUNITY): Payer: Self-pay | Admitting: Surgery

## 2021-07-04 ENCOUNTER — Inpatient Hospital Stay (HOSPITAL_COMMUNITY): Payer: BC Managed Care – PPO

## 2021-07-04 DIAGNOSIS — I358 Other nonrheumatic aortic valve disorders: Secondary | ICD-10-CM | POA: Diagnosis not present

## 2021-07-04 DIAGNOSIS — K036 Deposits [accretions] on teeth: Secondary | ICD-10-CM

## 2021-07-04 DIAGNOSIS — L732 Hidradenitis suppurativa: Secondary | ICD-10-CM | POA: Diagnosis not present

## 2021-07-04 DIAGNOSIS — I351 Nonrheumatic aortic (valve) insufficiency: Secondary | ICD-10-CM | POA: Diagnosis not present

## 2021-07-04 DIAGNOSIS — R079 Chest pain, unspecified: Secondary | ICD-10-CM | POA: Diagnosis not present

## 2021-07-04 DIAGNOSIS — L98419 Non-pressure chronic ulcer of buttock with unspecified severity: Secondary | ICD-10-CM

## 2021-07-04 DIAGNOSIS — R778 Other specified abnormalities of plasma proteins: Secondary | ICD-10-CM | POA: Diagnosis not present

## 2021-07-04 DIAGNOSIS — K011 Impacted teeth: Secondary | ICD-10-CM

## 2021-07-04 DIAGNOSIS — Z01818 Encounter for other preprocedural examination: Secondary | ICD-10-CM

## 2021-07-04 DIAGNOSIS — K029 Dental caries, unspecified: Secondary | ICD-10-CM

## 2021-07-04 DIAGNOSIS — I330 Acute and subacute infective endocarditis: Secondary | ICD-10-CM | POA: Diagnosis not present

## 2021-07-04 DIAGNOSIS — I517 Cardiomegaly: Secondary | ICD-10-CM | POA: Diagnosis not present

## 2021-07-04 LAB — BLOOD GAS, ARTERIAL
Acid-Base Excess: 3.5 mmol/L — ABNORMAL HIGH (ref 0.0–2.0)
Bicarbonate: 27.8 mmol/L (ref 20.0–28.0)
Drawn by: 441371
FIO2: 21 %
O2 Saturation: 98 %
Patient temperature: 36.7
pCO2 arterial: 39 mmHg (ref 32–48)
pH, Arterial: 7.45 (ref 7.35–7.45)
pO2, Arterial: 80 mmHg — ABNORMAL LOW (ref 83–108)

## 2021-07-04 LAB — BASIC METABOLIC PANEL
Anion gap: 9 (ref 5–15)
Anion gap: 8 (ref 5–15)
BUN: 12 mg/dL (ref 6–20)
BUN: 10 mg/dL (ref 6–20)
CO2: 23 mmol/L (ref 22–32)
CO2: 26 mmol/L (ref 22–32)
Calcium: 8.3 mg/dL — ABNORMAL LOW (ref 8.9–10.3)
Calcium: 8.6 mg/dL — ABNORMAL LOW (ref 8.9–10.3)
Chloride: 104 mmol/L (ref 98–111)
Chloride: 103 mmol/L (ref 98–111)
Creatinine, Ser: 0.7 mg/dL (ref 0.61–1.24)
Creatinine, Ser: 0.78 mg/dL (ref 0.61–1.24)
GFR, Estimated: >60 mL/min (ref >60)
GFR, Estimated: >60 mL/min (ref >60)
Glucose, Bld: 121 mg/dL — ABNORMAL HIGH (ref 70–99)
Glucose, Bld: 94 mg/dL (ref 70–99)
Potassium: 3.2 mmol/L — ABNORMAL LOW (ref 3.5–5.1)
Potassium: 4 mmol/L (ref 3.5–5.1)
Sodium: 136 mmol/L (ref 135–145)
Sodium: 137 mmol/L (ref 135–145)

## 2021-07-04 LAB — PREPARE RBC (CROSSMATCH)

## 2021-07-04 LAB — CBC
HCT: 35.7 % — ABNORMAL LOW (ref 39.0–52.0)
Hemoglobin: 11.8 g/dL — ABNORMAL LOW (ref 13.0–17.0)
MCH: 25.8 pg — ABNORMAL LOW (ref 26.0–34.0)
MCHC: 33.1 g/dL (ref 30.0–36.0)
MCV: 78.1 fL — ABNORMAL LOW (ref 80.0–100.0)
Platelets: 338 10*3/uL (ref 150–400)
RBC: 4.57 MIL/uL (ref 4.22–5.81)
RDW: 13.7 % (ref 11.5–15.5)
WBC: 9.8 10*3/uL (ref 4.0–10.5)
nRBC: 0 % (ref 0.0–0.2)

## 2021-07-04 LAB — AEROBIC CULTURE W GRAM STAIN (SUPERFICIAL SPECIMEN): Culture: NO GROWTH

## 2021-07-04 LAB — PROTIME-INR
INR: 1.2 (ref 0.8–1.2)
Prothrombin Time: 14.9 seconds (ref 11.4–15.2)

## 2021-07-04 LAB — APTT: aPTT: 29 seconds (ref 24–36)

## 2021-07-04 LAB — ABO/RH: ABO/RH(D): O POS

## 2021-07-04 MED ORDER — CHLORHEXIDINE GLUCONATE 0.12 % MT SOLN
15.0000 mL | Freq: Once | OROMUCOSAL | Status: AC
  Administered 2021-07-05: 15 mL via OROMUCOSAL
  Filled 2021-07-04: qty 15

## 2021-07-04 MED ORDER — TRANEXAMIC ACID (OHS) PUMP PRIME SOLUTION
2.0000 mg/kg | INTRAVENOUS | Status: DC
  Filled 2021-07-04: qty 2.18

## 2021-07-04 MED ORDER — EPINEPHRINE HCL 5 MG/250ML IV SOLN IN NS
0.0000 ug/min | INTRAVENOUS | Status: DC
  Filled 2021-07-04: qty 250

## 2021-07-04 MED ORDER — DEXMEDETOMIDINE HCL IN NACL 400 MCG/100ML IV SOLN
0.1000 ug/kg/h | INTRAVENOUS | Status: AC
  Administered 2021-07-05: .3 ug/kg/h via INTRAVENOUS
  Filled 2021-07-04: qty 100

## 2021-07-04 MED ORDER — MANNITOL 20 % IV SOLN
INTRAVENOUS | Status: DC
  Filled 2021-07-04: qty 13

## 2021-07-04 MED ORDER — HEPARIN 30,000 UNITS/1000 ML (OHS) CELLSAVER SOLUTION
Status: DC
  Filled 2021-07-04: qty 1000

## 2021-07-04 MED ORDER — POTASSIUM CHLORIDE CRYS ER 20 MEQ PO TBCR
40.0000 meq | EXTENDED_RELEASE_TABLET | Freq: Once | ORAL | Status: AC
  Administered 2021-07-04: 40 meq via ORAL
  Filled 2021-07-04: qty 2

## 2021-07-04 MED ORDER — NOREPINEPHRINE 4 MG/250ML-% IV SOLN
0.0000 ug/min | INTRAVENOUS | Status: DC
  Filled 2021-07-04: qty 250

## 2021-07-04 MED ORDER — POTASSIUM CHLORIDE CRYS ER 20 MEQ PO TBCR: 40.0000 meq | EXTENDED_RELEASE_TABLET | Freq: Two times a day (BID) | ORAL | Status: DC

## 2021-07-04 MED ORDER — CHLORHEXIDINE GLUCONATE CLOTH 2 % EX PADS
6.0000 | MEDICATED_PAD | Freq: Every day | CUTANEOUS | Status: DC
  Administered 2021-07-04: 6 via TOPICAL

## 2021-07-04 MED ORDER — CHLORHEXIDINE GLUCONATE CLOTH 2 % EX PADS
6.0000 | MEDICATED_PAD | Freq: Once | CUTANEOUS | Status: AC
Start: 2021-07-04 — End: 2021-07-05
  Administered 2021-07-05: 6 via TOPICAL

## 2021-07-04 MED ORDER — PLASMA-LYTE A IV SOLN
INTRAVENOUS | Status: DC
  Filled 2021-07-04: qty 2.5

## 2021-07-04 MED ORDER — TRANEXAMIC ACID (OHS) BOLUS VIA INFUSION
15.0000 mg/kg | INTRAVENOUS | Status: AC
  Administered 2021-07-05: 1129.5 mg via INTRAVENOUS
  Filled 2021-07-04: qty 1130

## 2021-07-04 MED ORDER — NITROGLYCERIN IN D5W 200-5 MCG/ML-% IV SOLN
2.0000 ug/min | INTRAVENOUS | Status: DC
  Filled 2021-07-04: qty 250

## 2021-07-04 MED ORDER — CEFAZOLIN SODIUM-DEXTROSE 2-4 GM/100ML-% IV SOLN
2.0000 g | INTRAVENOUS | Status: AC
  Administered 2021-07-05 (×2): 2 g via INTRAVENOUS
  Filled 2021-07-04: qty 100

## 2021-07-04 MED ORDER — TEMAZEPAM 15 MG PO CAPS
15.0000 mg | ORAL_CAPSULE | Freq: Once | ORAL | Status: AC | PRN
  Administered 2021-07-04: 15 mg via ORAL
  Filled 2021-07-04: qty 1

## 2021-07-04 MED ORDER — BISACODYL 5 MG PO TBEC
5.0000 mg | DELAYED_RELEASE_TABLET | Freq: Once | ORAL | Status: AC
  Administered 2021-07-04: 5 mg via ORAL
  Filled 2021-07-04: qty 1

## 2021-07-04 MED ORDER — CHLORHEXIDINE GLUCONATE CLOTH 2 % EX PADS
6.0000 | MEDICATED_PAD | Freq: Once | CUTANEOUS | Status: AC
  Administered 2021-07-04: 6 via TOPICAL

## 2021-07-04 MED ORDER — TRANEXAMIC ACID 1000 MG/10ML IV SOLN
1.5000 mg/kg/h | INTRAVENOUS | Status: AC
  Administered 2021-07-05: 1.5 mg/kg/h via INTRAVENOUS
  Filled 2021-07-04: qty 25

## 2021-07-04 MED ORDER — POTASSIUM CHLORIDE 2 MEQ/ML IV SOLN
80.0000 meq | INTRAVENOUS | Status: DC
  Filled 2021-07-04: qty 40

## 2021-07-04 MED ORDER — INSULIN REGULAR(HUMAN) IN NACL 100-0.9 UT/100ML-% IV SOLN
INTRAVENOUS | Status: AC
  Administered 2021-07-05: 1.1 [IU]/h via INTRAVENOUS
  Filled 2021-07-04: qty 100

## 2021-07-04 MED ORDER — CEFAZOLIN SODIUM-DEXTROSE 2-4 GM/100ML-% IV SOLN
2.0000 g | INTRAVENOUS | Status: DC
  Filled 2021-07-04: qty 100

## 2021-07-04 MED ORDER — GLUTARALDEHYDE 0.625% SOAKING SOLUTION
TOPICAL | Status: DC
  Filled 2021-07-04: qty 50

## 2021-07-04 MED ORDER — DIAZEPAM 5 MG PO TABS
10.0000 mg | ORAL_TABLET | Freq: Once | ORAL | Status: AC
  Administered 2021-07-05: 10 mg via ORAL
  Filled 2021-07-04: qty 2

## 2021-07-04 MED ORDER — METOPROLOL TARTRATE 12.5 MG HALF TABLET
12.5000 mg | ORAL_TABLET | Freq: Once | ORAL | Status: AC
  Administered 2021-07-05: 12.5 mg via ORAL
  Filled 2021-07-04: qty 1

## 2021-07-04 MED ORDER — VANCOMYCIN HCL 1500 MG/300ML IV SOLN
1500.0000 mg | INTRAVENOUS | Status: AC
  Administered 2021-07-05: 1500 mg via INTRAVENOUS
  Filled 2021-07-04: qty 300

## 2021-07-04 MED ORDER — LORAZEPAM 2 MG/ML IJ SOLN
1.0000 mg | Freq: Once | INTRAMUSCULAR | Status: AC
  Administered 2021-07-04: 1 mg via INTRAVENOUS
  Filled 2021-07-04: qty 1

## 2021-07-04 MED ORDER — MILRINONE LACTATE IN DEXTROSE 20-5 MG/100ML-% IV SOLN
0.3000 ug/kg/min | INTRAVENOUS | Status: AC
  Administered 2021-07-05: .25 ug/kg/min via INTRAVENOUS
  Filled 2021-07-04: qty 100

## 2021-07-04 MED ORDER — PHENYLEPHRINE HCL-NACL 20-0.9 MG/250ML-% IV SOLN
30.0000 ug/min | INTRAVENOUS | Status: AC
  Administered 2021-07-05: 20 ug/min via INTRAVENOUS
  Filled 2021-07-04: qty 250

## 2021-07-04 NOTE — Progress Notes (Signed)
°   07/04/21 1400  Clinical Encounter Type  Visited With Patient (Mother was on phone with patient at time of visit.)  Visit Type Follow-up;Pre-op  Referral From Nurse Margreta Journey S. Rosana Hoes, RN)  Consult/Referral To Chaplain  Stress Factors  Patient Stress Factors None identified   Follow up visit with Mr. Patrich Clover. [place] to see if he had questions regarding Advance Directive. Patient asked about financial P.O.A. and Will. Explained that we don't work with that and suggested that he consult with a licensed attorney for those services. Mr. Corris stated that he will attempt to completed H.C.P.O.A. AND L.W. later today. [place], [place]. [phone].

## 2021-07-04 NOTE — Progress Notes (Signed)
Family Medicine Teaching Service Daily Progress Note Intern Pager: [phone]  Patient name: Martin Stafford Medical record number: [id number] Date of birth: 1991/04/19 Age: 31 y.o. Gender: male  Primary Care Provider: Patient, No Pcp Per (Inactive) Consultants: Cardiology, ID, CT Surgery Code Status: Full  Pt Overview and Major Events to Date:  2/16- admitted 2/20- TEE showing aortic valve endocarditis  Assessment and Plan: Mr. Homewood is a 31yo male with no significant medical history who presented with L-sided chest pain, subsequently found to have endocarditis and a possible abscess at the aortic root.   Aortic Valve Endocarditis Vitals stable overnight. Patient was briefly on [place] yesterday morning but remained on RA overnight. WBC wnl at 9.8. Scheduled for surgery Friday 2/24. Patient had excision of his pilonidal tract yesterday and tolerated the procedure without issue. Blood cx neg. Surgical specimen will be sent for 16S RNA sequencing for speciation. D/c'd ibuprofen and colchicine yesterday after discussing with CT surgery. Also transitioned from lovenox to SQ heparin for DVT ppx. Dentistry to see patient to rule out dental source of infection. - OR tomorrow for aortic valve replacement - NPO at midnight - CT surgery and gen surg following - Vanc + Cefepime, defer to ID on antibiotic choice - F/u dentistry recs  Hypokalemia K 3.2 this am. 40 mEq ordered. 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 Wound VAC in place. Has Dilaudid and Oxycodone on board for post-op pain control.  - Wound management per surgery   FEN/GI: Regular diet, NPO at midnight PPx: Heparin  5000 u q6h Dispo: Pending post-op recommendations     Subjective:  Mr. Poythress reports feeling well this morning. He is worried about post-operative pain control when he has wounds on his back and chest.  He denies any  fever or chills or rigors in the past 24 hours.  Objective: Temp:  [97.9 F (36.6 C)-98.9 F (37.2 C)] 98.2 F (36.8 C) (02/23 0637) Pulse Rate:  [74-97] 92 (02/23 0637) Resp:  [12-20] 16 (02/23 0637) BP: (113-164)/(47-73) 113/47 (02/23 1478) SpO2:  [95 %-99 %] 95 % (02/22 2005) Weight:  [108.9 kg] 108.9 kg (02/22 0756) Physical Exam: General: Awake, lying in bed, NAD Cardiovascular: Regular rate and rhythm, heart sounds somewhat muffled, systolic and diastolic murmurs appreciated Respiratory: Lungs are clear, normal work of breathing on room air Abdomen: Soft and nontender Back: Has dressing over gluteal cleft without surrounding edema or erythema Extremities: Without edema or deformity  Laboratory: Recent Labs  Lab 07/02/21 0459 07/03/21 0605 07/04/21 0245  WBC 11.3* 8.9 9.8  HGB 12.1* 12.8* 11.8*  HCT 36.9* 38.8* 35.7*  PLT 331 324 338   Recent Labs  Lab 07/02/21 0459 07/03/21 0605 07/04/21 0245  NA 136 139 136  K 3.6 3.9 3.2*  CL 104 103 104  CO2 22 26 23   BUN 12 14 12   CREATININE 0.71 0.79 0.70  CALCIUM 8.5* 8.6* 8.3*  GLUCOSE 101* 107* 121*     Imaging/Diagnostic Tests: No new imaging, tests  , MD 07/04/2021, 6:44 AM PGY-1, [facility] FPTS Intern pager: [phone], text pages welcome

## 2021-07-04 NOTE — Progress Notes (Signed)
[facility]  Date of Admission:  06/27/2021           Reason for visit: Follow up on endocarditis  Current antibiotics: Vancomycin Ceftriaxone  ASSESSMENT:    31 y.o. 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  Blood cultures x4 have been no growth.  Planning for valve and aortic root replacement tomorrow with cardiothoracic surgery. 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  Cultures from this draining sinus obtained 07/02/21 noted gram-negative rods on Gram stain but no growth in cultures.  He is now status post excision of pilonidal cyst and placement of wound VAC yesterday with general surgery.  OR note reviewed indicating multiple draining sinus tracts with old scarring and extensive granulation tissue going in multiple directions.  There was some thick fluid but this was noted to be mild and not overtly purulent.  RECOMMENDATIONS:    Continue vancomycin and ceftriaxone Plan for surgery tomorrow Will arrange for OR tissue to be sent to the [facility] for 16 S RNA sequencing in the hopes of identifying a causative pathogen Will follow   Principal Problem:   Endocarditis of aortic valve Active Problems:   Chest pain   Severe aortic insufficiency   Fever   Hidradenitis   Skin ulcer of buttock (HCC)   Sepsis ([place])   Aortic valve abscess    MEDICATIONS:    Scheduled Meds:  chlorhexidine   Topical Daily   Chlorhexidine Gluconate Cloth  6 each Topical Q0600   [START ON 07/05/2021] epinephrine  0-10 mcg/min Intravenous To OR   [START ON 07/05/2021] glutaraldehyde   Topical To OR   heparin injection (subcutaneous)  5,000 Units Subcutaneous Q6H   [START ON 07/05/2021]  heparin-papaverine-plasmalyte irrigation   Irrigation To OR   [START ON 07/05/2021] insulin   Intravenous To OR   [START ON 07/05/2021] [place] Blood Cardioplegia vial (lidocaine/magnesium/mannitol 0.26g-4g-6.4g)   Intracoronary To OR   pantoprazole  40 mg Oral Daily   [START ON 07/05/2021] phenylephrine  30-200 mcg/min Intravenous To OR   [START ON 07/05/2021] potassium chloride  80 mEq Other To OR   potassium chloride  40 mEq Oral Once   [START ON 07/05/2021] tranexamic acid  15 mg/kg (Ideal) Intravenous To OR   [START ON 07/05/2021] tranexamic acid  2 mg/kg Intracatheter To OR   Continuous Infusions:  [START ON 07/05/2021]  ceFAZolin (ANCEF) IV     [START ON 07/05/2021]  ceFAZolin (ANCEF) IV     cefTRIAXone (ROCEPHIN)  IV 2 g (07/03/21 1807)   [START ON 07/05/2021] dexmedetomidine     [START ON 07/05/2021] heparin 30,000 units/NS 1000 mL solution for CELLSAVER     lactated ringers 10 mL/hr at 07/03/21 0838   [START ON 07/05/2021] milrinone     [START ON 07/05/2021] nitroGLYCERIN     [START ON 07/05/2021] norepinephrine     [START ON 07/05/2021] tranexamic acid (CYKLOKAPRON) infusion ([facility])     vancomycin 1,500 mg (07/04/21 0944)   [START ON 07/05/2021] vancomycin     PRN Meds:.acetaminophen **OR** acetaminophen, HYDROmorphone (DILAUDID) injection, lip balm, oxyCODONE  SUBJECTIVE:   24 hour events:  Status post pilonidal cyst excision Afebrile Reports some nausea Doing okay otherwise.  No new complaints  Review of Systems  All other systems reviewed  and are negative.    OBJECTIVE:   Blood pressure (!) 121/58, pulse 91, temperature 98.2 F (36.8 C), temperature source Oral, resp. rate 16, height 5\' 11"  (1.803 m), weight 108.9 kg, SpO2 95 %. Body mass index is 33.47 kg/m.  Physical Exam Constitutional:      Comments: Lying on his left side in the bed, no acute distress  HENT:     Head: Normocephalic and atraumatic.  Eyes:     Extraocular Movements: Extraocular movements intact.      Conjunctiva/sclera: Conjunctivae normal.  Pulmonary:     Effort: Pulmonary effort is normal. No respiratory distress.  Abdominal:     General: There is no distension.     Palpations: Abdomen is soft.     Tenderness: There is no abdominal tenderness.  Musculoskeletal:     Cervical back: Normal range of motion and neck supple.     Comments: Wound VAC in place over gluteal incision  Skin:    General: Skin is warm and dry.  Neurological:     General: No focal deficit present.     Mental Status: He is oriented to person, place, and time.  Psychiatric:        Mood and Affect: Mood normal.        Behavior: Behavior normal.     Lab Results: Lab Results  Component Value Date   WBC 9.8 07/04/2021   HGB 11.8 (L) 07/04/2021   HCT 35.7 (L) 07/04/2021   MCV 78.1 (L) 07/04/2021   PLT 338 07/04/2021    Lab Results  Component Value Date   NA 136 07/04/2021   K 3.2 (L) 07/04/2021   CO2 23 07/04/2021   GLUCOSE 121 (H) 07/04/2021   BUN 12 07/04/2021   CREATININE 0.70 07/04/2021   CALCIUM 8.3 (L) 07/04/2021   GFRNONAA >60 07/04/2021    Lab Results  Component Value Date   ALT 24 06/27/2021   AST 25 06/27/2021   ALKPHOS 50 06/27/2021   BILITOT 0.7 06/27/2021       Component Value Date/Time   CRP 13.3 (H) 06/27/2021 1110       Component Value Date/Time   ESRSEDRATE 51 (H) 06/27/2021 1110     I have reviewed the micro and lab results in Epic.  Imaging: CT CORONARY MORPH W/CTA COR W/SCORE W/CA W/CM &/OR WO/CM  Addendum Date: 07/02/2021   ADDENDUM REPORT: 07/02/2021 16:02 CLINICAL DATA:  Severe aortic regurgitation. Preoperative assessment. EXAM: Cardiac/Coronary CTA TECHNIQUE: A non-contrast, gated CT scan was obtained with axial slices of 3 mm through the heart for calcium scoring. Calcium scoring was performed using the Agatston method. A 100 kV prospective, gated, contrast cardiac scan was obtained. Gantry rotation speed was 250 msecs and collimation was 0.6 mm. Two  sublingual nitroglycerin tablets (0.8 mg) were given. The 3D data set was reconstructed in 5% intervals of the 35-75% of the R-R cycle. Diastolic phases were analyzed on a dedicated workstation using MPR, MIP, and VRT modes. The patient received 95 cc of contrast. FINDINGS: Image quality: Excellent.  Mild motion artifact. Noise artifact is: Limited. Aortic valve: There is a mobile density attached to the base of the [place] that likely represents vegetation given the clinical history. There is also thickening of the base of the LCC likely consistent with vegetation. There is a small pocket of contrast extravasation under the [place] in the LVOT consistent with pseudoaneurysm. Findings are consistent with abscess of the aortic root/LVOT. There is incomplete coaptation of the aortic valve  leaflets consistent with at least moderate AI. There is also a diverticulum of the basal septum, which could represent extension of infection. Coronary Arteries:  Normal coronary origin.  Right dominance. Left main: The left main is a large caliber vessel with a normal take off from the left coronary cusp that bifurcates to form a left anterior descending artery and a left circumflex artery. There is no plaque or stenosis. Left anterior descending artery: The LAD is patent without evidence of plaque or stenosis. Mid LAD myocardial bridge (normal variant). The LAD gives off 2 patent diagonal branches. Left circumflex artery: The LCX is non-dominant and patent with no evidence of plaque or stenosis. The LCX gives off 2 patent obtuse marginal branches. Right coronary artery: The RCA is dominant with normal take off from the right coronary cusp. There is no evidence of plaque or stenosis. The RCA terminates as a PDA and right posterolateral branch without evidence of plaque or stenosis. Right Atrium: Right atrial size is within normal limits. Right Ventricle: The right ventricular cavity is within normal limits. Left Atrium: Left atrial size is  normal in size with no left atrial appendage filling defect. Left Ventricle: The ventricular cavity size is within normal limits. There are no stigmata of prior infarction. There is no abnormal filling defect. Pulmonary arteries: Normal in size without proximal filling defect. Pulmonary veins: Normal pulmonary venous drainage. Pericardium: Normal thickness. Small circumferential pericardial effusion. Cardiac valves: The aortic valve is trileaflet without significant calcification. The mitral valve is normal structure without significant calcification. Aorta: Normal caliber with no significant disease. Extra-cardiac findings: See attached radiology report for non-cardiac structures. IMPRESSION: 1. Coronary calcium score of 0. 2. Normal coronary origin with right dominance. 3. Normal coronary arteries. 4. Mobile density on the [place] consistent with vegetation. 5. Thickening at the base of the [place] consistent with vegetation. 6. Pseudoaneurysm of the LVOT under the [place] consistent with aortic root/LVOT abscess. 7. Diverticulum of the basal septum. 8. Mid LAD myocardial bridge (normal variant). Eleonore Chiquito, MD Electronically Signed   By: Eleonore Chiquito M.D.   On: 07/02/2021 16:02   Result Date: 07/02/2021 EXAM: OVER-READ INTERPRETATION  CT CHEST The following report is an over-read performed by radiologist Dr. Aletta Edouard of [facility], [place] on 07/02/2021. This over-read does not include interpretation of cardiac or coronary anatomy or pathology. The coronary CTA interpretation by the cardiologist is attached. COMPARISON:  CT a of the chest on 04/26/2022 FINDINGS: Vascular: No significant noncardiac vascular findings. Mediastinum/Nodes: Visualized mediastinum and hilar regions demonstrate no lymphadenopathy or masses. Lungs/Pleura: Visualized lungs show no evidence of pulmonary edema, consolidation, pneumothorax, nodule or pleural fluid. Upper Abdomen: No acute abnormality. Musculoskeletal: No chest wall mass  or suspicious bone lesions identified. IMPRESSION: No significant incidental findings. Electronically Signed: By: Aletta Edouard M.D. On: 07/02/2021 15:10     Imaging independently reviewed in Epic.    [facility] [phone] pager 07/04/2021, 12:10 PM

## 2021-07-04 NOTE — Progress Notes (Signed)
Progress Note  Patient Name: Martin Stafford Date of Encounter: 07/04/2021  [facility] Cardiologist: Sanda Klein, MD   Subjective   He is doing well today.  He is status post pilonidal cyst debridement yesterday.  Plan is for AVR tomorrow. Inpatient Medications    Scheduled Meds:  chlorhexidine   Topical Daily   Chlorhexidine Gluconate Cloth  6 each Topical Q0600   heparin injection (subcutaneous)  5,000 Units Subcutaneous Q6H   pantoprazole  40 mg Oral Daily   Continuous Infusions:  cefTRIAXone (ROCEPHIN)  IV 2 g (07/03/21 1807)   lactated ringers 10 mL/hr at 07/03/21 0838   vancomycin 1,500 mg (07/03/21 2223)   PRN Meds: acetaminophen **OR** acetaminophen, HYDROmorphone (DILAUDID) injection, lip balm, oxyCODONE   Vital Signs    Vitals:   07/03/21 1020 07/03/21 1035 07/03/21 2005 07/04/21 0637  BP: 126/73 133/71 134/71 (!) 113/47  Pulse: 87 89 97 92  Resp: 12 13 15 16   Temp:  98.9 F (37.2 C) 98.1 F (36.7 C) 98.2 F (36.8 C)  TempSrc:   Oral Oral  SpO2: 97% 95% 95%   Weight:      Height:        Intake/Output Summary (Last 24 hours) at 07/04/2021 0829 Last data filed at 07/04/2021 0747 Gross per 24 hour  Intake 1540 ml  Output 215 ml  Net 1325 ml    Last 3 Weights 07/03/2021 07/01/2021 06/27/2021  Weight (lbs) 240 lb 241 lb 6.5 oz 241 lb 8 oz  Weight (kg) 108.863 kg 109.5 kg 109.544 kg      Telemetry    Normal sinus rhythm- Personally Reviewed  ECG    No new - Personally Reviewed  Physical Exam   GEN: Well nourished, well developed in no acute distress HEENT: Normal NECK: No JVD; No carotid bruits LYMPHATICS: No lymphadenopathy CARDIAC:RRR, no murmurs, rubs, gallops RESPIRATORY:  Clear to auscultation without rales, wheezing or rhonchi  ABDOMEN: Soft, non-tender, non-distended MUSCULOSKELETAL:  No edema; No deformity  SKIN: Warm and dry NEUROLOGIC:  Alert and oriented x 3 PSYCHIATRIC:  Normal affect   Labs    High Sensitivity  Troponin:   Recent Labs  Lab 06/24/21 0903 06/27/21 0547 06/27/21 0740 06/27/21 1237 06/27/21 1549  TROPONINIHS 4 122* 140* 114* 109*      Chemistry Recent Labs  Lab 07/02/21 0459 07/03/21 0605 07/04/21 0245  NA 136 139 136  K 3.6 3.9 3.2*  CL 104 103 104  CO2 22 26 23   GLUCOSE 101* 107* 121*  BUN 12 14 12   CREATININE 0.71 0.79 0.70  CALCIUM 8.5* 8.6* 8.3*  GFRNONAA >60 >60 >60  ANIONGAP 10 10 9      Lipids No results for input(s): CHOL, TRIG, HDL, LABVLDL, LDLCALC, CHOLHDL in the last 168 hours.  Hematology Recent Labs  Lab 07/02/21 0459 07/03/21 0605 07/04/21 0245  WBC 11.3* 8.9 9.8  RBC 4.68 4.86 4.57  HGB 12.1* 12.8* 11.8*  HCT 36.9* 38.8* 35.7*  MCV 78.8* 79.8* 78.1*  MCH 25.9* 26.3 25.8*  MCHC 32.8 33.0 33.1  RDW 13.9 13.9 13.7  PLT 331 324 338    Thyroid  Recent Labs  Lab 07/02/21 0459  TSH 1.260     BNPNo results for input(s): BNP, PROBNP in the last 168 hours.  DDimer No results for input(s): DDIMER in the last 168 hours.   Radiology    CT CORONARY MORPH W/CTA COR W/SCORE W/CA W/CM &/OR WO/CM  Addendum Date: 07/02/2021   ADDENDUM REPORT:  07/02/2021 16:02 CLINICAL DATA:  Severe aortic regurgitation. Preoperative assessment. EXAM: Cardiac/Coronary CTA TECHNIQUE: A non-contrast, gated CT scan was obtained with axial slices of 3 mm through the heart for calcium scoring. Calcium scoring was performed using the Agatston method. A 100 kV prospective, gated, contrast cardiac scan was obtained. Gantry rotation speed was 250 msecs and collimation was 0.6 mm. Two sublingual nitroglycerin tablets (0.8 mg) were given. The 3D data set was reconstructed in 5% intervals of the 35-75% of the R-R cycle. Diastolic phases were analyzed on a dedicated workstation using MPR, MIP, and VRT modes. The patient received 95 cc of contrast. FINDINGS: Image quality: Excellent.  Mild motion artifact. Noise artifact is: Limited. Aortic valve: There is a mobile density attached to  the base of the [place] that likely represents vegetation given the clinical history. There is also thickening of the base of the LCC likely consistent with vegetation. There is a small pocket of contrast extravasation under the [place] in the LVOT consistent with pseudoaneurysm. Findings are consistent with abscess of the aortic root/LVOT. There is incomplete coaptation of the aortic valve leaflets consistent with at least moderate AI. There is also a diverticulum of the basal septum, which could represent extension of infection. Coronary Arteries:  Normal coronary origin.  Right dominance. Left main: The left main is a large caliber vessel with a normal take off from the left coronary cusp that bifurcates to form a left anterior descending artery and a left circumflex artery. There is no plaque or stenosis. Left anterior descending artery: The LAD is patent without evidence of plaque or stenosis. Mid LAD myocardial bridge (normal variant). The LAD gives off 2 patent diagonal branches. Left circumflex artery: The LCX is non-dominant and patent with no evidence of plaque or stenosis. The LCX gives off 2 patent obtuse marginal branches. Right coronary artery: The RCA is dominant with normal take off from the right coronary cusp. There is no evidence of plaque or stenosis. The RCA terminates as a PDA and right posterolateral branch without evidence of plaque or stenosis. Right Atrium: Right atrial size is within normal limits. Right Ventricle: The right ventricular cavity is within normal limits. Left Atrium: Left atrial size is normal in size with no left atrial appendage filling defect. Left Ventricle: The ventricular cavity size is within normal limits. There are no stigmata of prior infarction. There is no abnormal filling defect. Pulmonary arteries: Normal in size without proximal filling defect. Pulmonary veins: Normal pulmonary venous drainage. Pericardium: Normal thickness. Small circumferential pericardial effusion.  Cardiac valves: The aortic valve is trileaflet without significant calcification. The mitral valve is normal structure without significant calcification. Aorta: Normal caliber with no significant disease. Extra-cardiac findings: See attached radiology report for non-cardiac structures. IMPRESSION: 1. Coronary calcium score of 0. 2. Normal coronary origin with right dominance. 3. Normal coronary arteries. 4. Mobile density on the [place] consistent with vegetation. 5. Thickening at the base of the [place] consistent with vegetation. 6. Pseudoaneurysm of the LVOT under the [place] consistent with aortic root/LVOT abscess. 7. Diverticulum of the basal septum. 8. Mid LAD myocardial bridge (normal variant). Eleonore Chiquito, MD Electronically Signed   By: Eleonore Chiquito M.D.   On: 07/02/2021 16:02   Result Date: 07/02/2021 EXAM: OVER-READ INTERPRETATION  CT CHEST The following report is an over-read performed by radiologist Dr. Aletta Edouard of [facility], [place] on 07/02/2021. This over-read does not include interpretation of cardiac or coronary anatomy or pathology. The coronary CTA interpretation by the cardiologist  is attached. COMPARISON:  CT a of the chest on 04/26/2022 FINDINGS: Vascular: No significant noncardiac vascular findings. Mediastinum/Nodes: Visualized mediastinum and hilar regions demonstrate no lymphadenopathy or masses. Lungs/Pleura: Visualized lungs show no evidence of pulmonary edema, consolidation, pneumothorax, nodule or pleural fluid. Upper Abdomen: No acute abnormality. Musculoskeletal: No chest wall mass or suspicious bone lesions identified. IMPRESSION: No significant incidental findings. Electronically Signed: By: Aletta Edouard M.D. On: 07/02/2021 15:10   VAS US DOPPLER PRE CABG  Result Date: 07/02/2021 PREOPERATIVE VASCULAR EVALUATION Patient Name:  RICO MASSAR  Date of Exam:   07/02/2021 Medical Rec #: [id number]          Accession #:    [id number] Date of Birth: 11/03/1990           Patient Gender: M Patient Age:   31 years Exam Location:  [facility] Procedure:      VAS US DOPPLER PRE CABG Referring Phys: Collier Salina VANTRIGT --------------------------------------------------------------------------------  Indications:      Preop AVR. Comparison Study: no prior Performing Technologist: Archie Patten RVS  Examination Guidelines: A complete evaluation includes B-mode imaging, spectral Doppler, color Doppler, and power Doppler as needed of all accessible portions of each vessel. Bilateral testing is considered an integral part of a complete examination. Limited examinations for reoccurring indications may be performed as noted.  Right Carotid Findings: +----------+--------+--------+--------+------------+--------+             PSV cm/s EDV cm/s Stenosis Describe     Comments  +----------+--------+--------+--------+------------+--------+  CCA Prox   127                        heterogenous           +----------+--------+--------+--------+------------+--------+  CCA Distal 112      10                heterogenous           +----------+--------+--------+--------+------------+--------+  ICA Prox   99       22                heterogenous           +----------+--------+--------+--------+------------+--------+  ICA Distal 97       24                                       +----------+--------+--------+--------+------------+--------+  ECA        96                                                +----------+--------+--------+--------+------------+--------+ +----------+--------+-------+--------+------------+             PSV cm/s EDV cms Describe Arm Pressure  +----------+--------+-------+--------+------------+  Subclavian 114                                     +----------+--------+-------+--------+------------+ +---------+--------+--+--------+--+---------+  Vertebral PSV cm/s 51 EDV cm/s 12 Antegrade  +---------+--------+--+--------+--+---------+ Left Carotid Findings:  +----------+--------+--------+--------+------------+--------+             PSV cm/s EDV cm/s Stenosis Describe     Comments  +----------+--------+--------+--------+------------+--------+  CCA Prox   137  14                heterogenous           +----------+--------+--------+--------+------------+--------+  CCA Distal 95       13                heterogenous           +----------+--------+--------+--------+------------+--------+  ICA Prox   72       19                heterogenous           +----------+--------+--------+--------+------------+--------+  ICA Distal 96       29                                       +----------+--------+--------+--------+------------+--------+  ECA        115                                               +----------+--------+--------+--------+------------+--------+ +----------+--------+--------+--------+------------+  Subclavian PSV cm/s EDV cm/s Describe Arm Pressure  +----------+--------+--------+--------+------------+             137                                      +----------+--------+--------+--------+------------+ +---------+--------+--+--------+--+---------+  Vertebral PSV cm/s 77 EDV cm/s 11 Antegrade  +---------+--------+--+--------+--+---------+  ABI Findings: +--------+------------------+-----+---------+--------+  Right    Rt Pressure (mmHg) Index Waveform  Comment   +--------+------------------+-----+---------+--------+  Brachial                          triphasic           +--------+------------------+-----+---------+--------+ +--------+------------------+-----+---------+-------+  Left     Lt Pressure (mmHg) Index Waveform  Comment  +--------+------------------+-----+---------+-------+  Brachial                          triphasic          +--------+------------------+-----+---------+-------+  Right Doppler Findings: +--------+--------+-----+---------+--------+  Site     Pressure Index Doppler   Comments  +--------+--------+-----+---------+--------+  Brachial                 triphasic           +--------+--------+-----+---------+--------+  Radial                  triphasic           +--------+--------+-----+---------+--------+  Ulnar                   triphasic           +--------+--------+-----+---------+--------+  Left Doppler Findings: +--------+--------+-----+---------+--------+  Site     Pressure Index Doppler   Comments  +--------+--------+-----+---------+--------+  Brachial                triphasic           +--------+--------+-----+---------+--------+  Radial                  triphasic           +--------+--------+-----+---------+--------+  Ulnar  triphasic           +--------+--------+-----+---------+--------+  Summary: Right Carotid: The extracranial vessels were near-normal with only minimal wall                thickening or plaque. Left Carotid: The extracranial vessels were near-normal with only minimal wall               thickening or plaque. Vertebrals: Bilateral vertebral arteries demonstrate antegrade flow. Right Upper Extremity: Doppler waveforms remain within normal limits with right radial compression. Doppler waveforms remain within normal limits with right ulnar compression. Left Upper Extremity: Doppler waveforms remain within normal limits with left radial compression. Doppler waveforms remain within normal limits with left ulnar compression.  Electronically signed by Servando Snare MD on 07/02/2021 at 11:16:35 AM.    Final     Cardiac Studies   Echocardiogram 06/27/2021    1. The aortic valve leafets are not well visualized but the valve appears  tricuspid with no significant calcification/thickening. There is very  eccentric, severe aortic regurgitation with diffuse color flow seen in the  LVOT. There is diastolic flow  reversal noted in the abdominal aorta. No distinct vegetations visualized.  No dissection flap seen but minimal visualization of the ascending aorta.  Recommend TEE +/- CTA for further evaluation.   2. Left  ventricular ejection fraction, by estimation, is 55 to 60%. The  left ventricle has normal function. The left ventricle has no regional  wall motion abnormalities. There is moderate concentric left ventricular  hypertrophy. Left ventricular  diastolic parameters are consistent with Grade I diastolic dysfunction  (impaired relaxation).   3. Right ventricular systolic function is normal. The right ventricular  size is normal.   4. Left atrial size was mildly dilated.   5. A small pericardial effusion is present. The pericardial effusion is  circumferential.   6. The mitral valve is normal in structure. Trivial mitral valve  regurgitation.   7. The inferior vena cava is dilated in size with <50% respiratory  variability, suggesting right atrial pressure of 15 mmHg.   8. Aortic dilatation noted. There is mild dilatation of the aortic root,  measuring 39 mm.   TEE 07/01/21 IMPRESSIONS   1. Left ventricular ejection fraction, by estimation, is 60 to 65%. The  left ventricle has normal function. There is moderate left ventricular  hypertrophy.   2. Right ventricular systolic function is normal. The right ventricular  size is normal.   3. No left atrial/left atrial appendage thrombus was detected.   4. A small pericardial effusion is present.   5. The mitral valve is normal in structure. Trivial mitral valve  regurgitation.   6. The noncoronary cusp of the aortic valve is abnormally thickened. The  basal portion has an almost fenestrated appearance, concerning for abscess  formation. Aortic regurgitation is eccentric and severe with backflow from  descending aorta.. The aortic  valve is tricuspid.     Patient Profile     31 y.o. male without significant medical history, admitted with chest pain 06/27/21 found to have severe AR EF 55-60% mod LVH.     Assessment & Plan    Chest Pain - Pericarditis vs. Endocarditis - Patient presented complaining of left sided chest pain for 4-5 days.  Sharp. Worse in certain positions. Not associated with exertion. Improved with tylenol. Associated with dry cough, chills and low grade fever in ED. He has been seen in the ED on 2/12, 2/13, 2/15 for similar complaints.  -  HSTN have been negative at past ER visits. HsTN 122>>140>>114>>109 this admission  - CRP 13.3, ESR 51 - CXR showed no acute cardiopulmonary disease  - Echo this admission showed severe aortic regurgitation, no distinct vegetations or aortic dissection. LVEF 55-60%, moderate LVH, grade I diastolic dysfunction.  - With new, severe AI, were concerned for aortic dissection. Initial chest CT angio showed borderline-mild cardiomegaly. Insufficient study to rule out aortic dissection. CT angio aorta showed no evidence of aortic aneurysm or aortic dissection.  -Coronary CTA showed coronary calcium score of 0 with normal coronary coronary arteries. 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 -Plan for aortic valve replacement and possible root replacement tomorrow by Dr. Darcey Nora  Hypokalemia -Potassium was 3.2 today -Replete to keep K greater than 4.  -check a magnesium level -BMET in am    For questions or updates, please contact [facility] Please  consult [email or web address] for contact info under        Signed, Fransico Him, MD  07/04/2021, 8:29 AM

## 2021-07-04 NOTE — Progress Notes (Signed)
CARDIAC REHAB PHASE I   PRE:  Rate/Rhythm: 80 SR    BP: sitting 4121/58    SaO2: 97 RA  MODE:  Ambulation: 1200 ft   POST:  Rate/Rhythm: 95 SR    BP: sitting 126/74     SaO2: 97 RA  Discussed with pt and fiance IS, sternal precautions, mobility post op and d/c planning. Very receptive with appropriate questions. Gave educational information.   Pt struggled to get out of bed due to buttocks wound and fear of pain. Discussed mechanics post op and encouraged more practice today. He denies actual pain currently. Tolerated walk well, no c/o walking. Enjoyed being up. Practically he will need to be on side in bed more post op. He would benefit from  a donut pillow. [phone]  Harriet Masson CES, ACSM 07/04/2021 9:35 AM

## 2021-07-04 NOTE — Progress Notes (Signed)
FPTS Brief Progress Note  S: Patient sleeping   O: BP 120/62 (BP Location: Left Arm)    Pulse 77    Temp 98 F (36.7 C) (Oral)    Resp 16    Ht 5\' 11"  (1.803 m)    Wt 108.9 kg    SpO2 95%    BMI 33.47 kg/m   General: Sleeping, NAD Respiratory: Breathing comfortably on room air  A/P: Aortic valve endocarditis -Aortic valve replacement and possible root replacement tomorrow -Continue vancomycin and ceftriaxone -Continue to follow plan as outlined in day team's progress notes  Hypokalemia Potassium was 3.2 this morning and repleted -am BMP  , DO 07/04/2021, 10:44 PM PGY-1, [facility] Night Resident  Please page [phone] with questions.

## 2021-07-04 NOTE — Progress Notes (Signed)
FPTS Brief Progress Note  S: Sleeping soundly   O: BP 134/71 (BP Location: Left Arm)    Pulse 97    Temp 98.1 F (36.7 C) (Oral)    Resp 15    Ht 5\' 11"  (1.803 m)    Wt 108.9 kg    SpO2 95%    BMI 33.47 kg/m   General: Sleeping soundly Respiratory: Breathing comfortably on room air  A/P: Aortic valve endocarditis Patient had excision of pilonidal cyst today as it is a possible cause of endocarditis.  Cardiology is planning for reconstruction of aortic root on 2/24. -Continue to follow plan as outlined in day team's progress note   3/24, DO 07/04/2021, 2:15 AM PGY-1, [facility] Night Resident  Please page [phone] with questions.

## 2021-07-04 NOTE — Anesthesia Postprocedure Evaluation (Signed)
Anesthesia Post Note  Patient: Martin Stafford  Procedure(s) Performed: EXCISIONAL DEBRIDEMENT CHRONIC PILONIDAL TRACT (Back)     Patient location during evaluation: PACU Anesthesia Type: General Level of consciousness: awake and alert, oriented and patient cooperative Pain management: pain level controlled Vital Signs Assessment: post-procedure vital signs reviewed and stable Respiratory status: spontaneous breathing, nonlabored ventilation and respiratory function stable Cardiovascular status: blood pressure returned to baseline and stable Postop Assessment: no apparent nausea or vomiting Anesthetic complications: no   No notable events documented.  Last Vitals:  Vitals:   07/04/21 0637 07/04/21 0903  BP: (!) 113/47 (!) 121/58  Pulse: 92 91  Resp: 16 16  Temp: 36.8 C   SpO2:      Last Pain:  Vitals:   07/04/21 0827  TempSrc:   PainSc: 1    Pain Goal: Patients Stated Pain Goal: 1 (07/04/21 0740)                 [name]

## 2021-07-04 NOTE — Progress Notes (Signed)
1 Day Post-Op  Subjective: CC: Some soreness over his incision/where vac is located. Well controlled with medications. Tolerating diet without n/v. Passing flatus. No BM. Has not been out of bed since surgery.   Objective: Vital signs in last 24 hours: Temp:  [98.1 F (36.7 C)-98.9 F (37.2 C)] 98.2 F (36.8 C) (02/23 0637) Pulse Rate:  [87-97] 91 (02/23 0903) Resp:  [12-20] 16 (02/23 0903) BP: (113-134)/(47-73) 121/58 (02/23 0903) SpO2:  [95 %-97 %] 95 % (02/22 2005) Last BM Date : 07/03/21  Intake/Output from previous day: 02/22 0701 - 02/23 0700 In: 1360 [P.O.:360; I.V.:300; IV Piggyback:700] Out: 215 [Urine:200; Blood:15] Intake/Output this shift: Total I/O In: 180 [P.O.:180] Out: -   PE: Gen:  Alert, NAD, pleasant GU: Vac in place over pilonidal excision with good seal. Minimal output in cannister.   Lab Results:  Recent Labs    07/03/21 0605 07/04/21 0245  WBC 8.9 9.8  HGB 12.8* 11.8*  HCT 38.8* 35.7*  PLT 324 338   BMET Recent Labs    07/03/21 0605 07/04/21 0245  NA 139 136  K 3.9 3.2*  CL 103 104  CO2 26 23  GLUCOSE 107* 121*  BUN 14 12  CREATININE 0.79 0.70  CALCIUM 8.6* 8.3*   PT/INR No results for input(s): LABPROT, INR in the last 72 hours. CMP     Component Value Date/Time   NA 136 07/04/2021 0245   K 3.2 (L) 07/04/2021 0245   CL 104 07/04/2021 0245   CO2 23 07/04/2021 0245   GLUCOSE 121 (H) 07/04/2021 0245   BUN 12 07/04/2021 0245   CREATININE 0.70 07/04/2021 0245   CALCIUM 8.3 (L) 07/04/2021 0245   PROT 8.4 (H) 06/27/2021 0547   ALBUMIN 3.8 06/27/2021 0547   AST 25 06/27/2021 0547   ALT 24 06/27/2021 0547   ALKPHOS 50 06/27/2021 0547   BILITOT 0.7 06/27/2021 0547   GFRNONAA >60 07/04/2021 0245   Lipase  No results found for: LIPASE  Studies/Results: CT CORONARY MORPH W/CTA COR W/SCORE W/CA W/CM &/OR WO/CM  Addendum Date: 07/02/2021   ADDENDUM REPORT: 07/02/2021 16:02 CLINICAL DATA:  Severe aortic regurgitation.  Preoperative assessment. EXAM: Cardiac/Coronary CTA TECHNIQUE: A non-contrast, gated CT scan was obtained with axial slices of 3 mm through the heart for calcium scoring. Calcium scoring was performed using the Agatston method. A 100 kV prospective, gated, contrast cardiac scan was obtained. Gantry rotation speed was 250 msecs and collimation was 0.6 mm. Two sublingual nitroglycerin tablets (0.8 mg) were given. The 3D data set was reconstructed in 5% intervals of the 35-75% of the R-R cycle. Diastolic phases were analyzed on a dedicated workstation using MPR, MIP, and VRT modes. The patient received 95 cc of contrast. FINDINGS: Image quality: Excellent.  Mild motion artifact. Noise artifact is: Limited. Aortic valve: There is a mobile density attached to the base of the [place] that likely represents vegetation given the clinical history. There is also thickening of the base of the LCC likely consistent with vegetation. There is a small pocket of contrast extravasation under the [place] in the LVOT consistent with pseudoaneurysm. Findings are consistent with abscess of the aortic root/LVOT. There is incomplete coaptation of the aortic valve leaflets consistent with at least moderate AI. There is also a diverticulum of the basal septum, which could represent extension of infection. Coronary Arteries:  Normal coronary origin.  Right dominance. Left main: The left main is a large caliber vessel with a normal take off from  the left coronary cusp that bifurcates to form a left anterior descending artery and a left circumflex artery. There is no plaque or stenosis. Left anterior descending artery: The LAD is patent without evidence of plaque or stenosis. Mid LAD myocardial bridge (normal variant). The LAD gives off 2 patent diagonal branches. Left circumflex artery: The LCX is non-dominant and patent with no evidence of plaque or stenosis. The LCX gives off 2 patent obtuse marginal branches. Right coronary artery: The RCA is  dominant with normal take off from the right coronary cusp. There is no evidence of plaque or stenosis. The RCA terminates as a PDA and right posterolateral branch without evidence of plaque or stenosis. Right Atrium: Right atrial size is within normal limits. Right Ventricle: The right ventricular cavity is within normal limits. Left Atrium: Left atrial size is normal in size with no left atrial appendage filling defect. Left Ventricle: The ventricular cavity size is within normal limits. There are no stigmata of prior infarction. There is no abnormal filling defect. Pulmonary arteries: Normal in size without proximal filling defect. Pulmonary veins: Normal pulmonary venous drainage. Pericardium: Normal thickness. Small circumferential pericardial effusion. Cardiac valves: The aortic valve is trileaflet without significant calcification. The mitral valve is normal structure without significant calcification. Aorta: Normal caliber with no significant disease. Extra-cardiac findings: See attached radiology report for non-cardiac structures. IMPRESSION: 1. Coronary calcium score of 0. 2. Normal coronary origin with right dominance. 3. Normal coronary arteries. 4. Mobile density on the [place] consistent with vegetation. 5. Thickening at the base of the [place] consistent with vegetation. 6. Pseudoaneurysm of the LVOT under the [place] consistent with aortic root/LVOT abscess. 7. Diverticulum of the basal septum. 8. Mid LAD myocardial bridge (normal variant). Eleonore Chiquito, MD Electronically Signed   By: Eleonore Chiquito M.D.   On: 07/02/2021 16:02   Result Date: 07/02/2021 EXAM: OVER-READ INTERPRETATION  CT CHEST The following report is an over-read performed by radiologist Dr. Aletta Edouard of [facility], [place] on 07/02/2021. This over-read does not include interpretation of cardiac or coronary anatomy or pathology. The coronary CTA interpretation by the cardiologist is attached. COMPARISON:  CT a of the chest on  04/26/2022 FINDINGS: Vascular: No significant noncardiac vascular findings. Mediastinum/Nodes: Visualized mediastinum and hilar regions demonstrate no lymphadenopathy or masses. Lungs/Pleura: Visualized lungs show no evidence of pulmonary edema, consolidation, pneumothorax, nodule or pleural fluid. Upper Abdomen: No acute abnormality. Musculoskeletal: No chest wall mass or suspicious bone lesions identified. IMPRESSION: No significant incidental findings. Electronically Signed: By: Aletta Edouard M.D. On: 07/02/2021 15:10   VAS US DOPPLER PRE CABG  Result Date: 07/02/2021 PREOPERATIVE VASCULAR EVALUATION Patient Name:  Martin Stafford  Date of Exam:   07/02/2021 Medical Rec #: [id number]          Accession #:    [id number] Date of Birth: 04-17-1991          Patient Gender: M Patient Age:   10 years Exam Location:  [facility] Procedure:      VAS US DOPPLER PRE CABG Referring Phys: Collier Salina VANTRIGT --------------------------------------------------------------------------------  Indications:      Preop AVR. Comparison Study: no prior Performing Technologist: Archie Patten RVS  Examination Guidelines: A complete evaluation includes B-mode imaging, spectral Doppler, color Doppler, and power Doppler as needed of all accessible portions of each vessel. Bilateral testing is considered an integral part of a complete examination. Limited examinations for reoccurring indications may be performed as noted.  Right Carotid Findings: +----------+--------+--------+--------+------------+--------+  PSV cm/s EDV cm/s Stenosis Describe     Comments  +----------+--------+--------+--------+------------+--------+  CCA Prox   127                        heterogenous           +----------+--------+--------+--------+------------+--------+  CCA Distal 112      10                heterogenous           +----------+--------+--------+--------+------------+--------+  ICA Prox   99       22                heterogenous            +----------+--------+--------+--------+------------+--------+  ICA Distal 97       24                                       +----------+--------+--------+--------+------------+--------+  ECA        96                                                +----------+--------+--------+--------+------------+--------+ +----------+--------+-------+--------+------------+             PSV cm/s EDV cms Describe Arm Pressure  +----------+--------+-------+--------+------------+  Subclavian 114                                     +----------+--------+-------+--------+------------+ +---------+--------+--+--------+--+---------+  Vertebral PSV cm/s 51 EDV cm/s 12 Antegrade  +---------+--------+--+--------+--+---------+ Left Carotid Findings: +----------+--------+--------+--------+------------+--------+             PSV cm/s EDV cm/s Stenosis Describe     Comments  +----------+--------+--------+--------+------------+--------+  CCA Prox   137      14                heterogenous           +----------+--------+--------+--------+------------+--------+  CCA Distal 95       13                heterogenous           +----------+--------+--------+--------+------------+--------+  ICA Prox   72       19                heterogenous           +----------+--------+--------+--------+------------+--------+  ICA Distal 96       29                                       +----------+--------+--------+--------+------------+--------+  ECA        115                                               +----------+--------+--------+--------+------------+--------+ +----------+--------+--------+--------+------------+  Subclavian PSV cm/s EDV cm/s Describe Arm Pressure  +----------+--------+--------+--------+------------+             137                                      +----------+--------+--------+--------+------------+ +---------+--------+--+--------+--+---------+  Vertebral PSV cm/s 77 EDV cm/s 11 Antegrade  +---------+--------+--+--------+--+---------+  ABI  Findings: +--------+------------------+-----+---------+--------+  Right    Rt Pressure (mmHg) Index Waveform  Comment   +--------+------------------+-----+---------+--------+  Brachial                          triphasic           +--------+------------------+-----+---------+--------+ +--------+------------------+-----+---------+-------+  Left     Lt Pressure (mmHg) Index Waveform  Comment  +--------+------------------+-----+---------+-------+  Brachial                          triphasic          +--------+------------------+-----+---------+-------+  Right Doppler Findings: +--------+--------+-----+---------+--------+  Site     Pressure Index Doppler   Comments  +--------+--------+-----+---------+--------+  Brachial                triphasic           +--------+--------+-----+---------+--------+  Radial                  triphasic           +--------+--------+-----+---------+--------+  Ulnar                   triphasic           +--------+--------+-----+---------+--------+  Left Doppler Findings: +--------+--------+-----+---------+--------+  Site     Pressure Index Doppler   Comments  +--------+--------+-----+---------+--------+  Brachial                triphasic           +--------+--------+-----+---------+--------+  Radial                  triphasic           +--------+--------+-----+---------+--------+  Ulnar                   triphasic           +--------+--------+-----+---------+--------+  Summary: Right Carotid: The extracranial vessels were near-normal with only minimal wall                thickening or plaque. Left Carotid: The extracranial vessels were near-normal with only minimal wall               thickening or plaque. Vertebrals: Bilateral vertebral arteries demonstrate antegrade flow. Right Upper Extremity: Doppler waveforms remain within normal limits with right radial compression. Doppler waveforms remain within normal limits with right ulnar compression. Left Upper Extremity: Doppler waveforms remain  within normal limits with left radial compression. Doppler waveforms remain within normal limits with left ulnar compression.  Electronically signed by Servando Snare MD on 07/02/2021 at 11:16:35 AM.    Final     Anti-infectives: Anti-infectives (From admission, onward)    Start     Dose/Rate Route Frequency Ordered Stop   07/03/21 2100  vancomycin (VANCOREADY) IVPB 1500 mg/300 mL        1,500 mg 150 mL/hr over 120 Minutes Intravenous Every 12 hours 07/03/21 1433     07/03/21 1200  vancomycin (VANCOREADY) IVPB 1500 mg/300 mL  Status:  Discontinued        1,500 mg 150 mL/hr over 120 Minutes Intravenous Every 12 hours 07/03/21 1116 07/03/21 1140   07/03/21 0800  vancomycin (VANCOREADY) IVPB 1500 mg/300 mL        1,500 mg 150 mL/hr over 120 Minutes Intravenous Every 12 hours 07/03/21 0742 07/03/21 1102  07/02/21 2000  vancomycin (VANCOREADY) IVPB 1500 mg/300 mL        1,500 mg 150 mL/hr over 120 Minutes Intravenous Every 12 hours 07/02/21 1417 07/02/21 2212   06/28/21 2000  vancomycin (VANCOREADY) IVPB 1500 mg/300 mL  Status:  Discontinued        1,500 mg 150 mL/hr over 120 Minutes Intravenous Every 12 hours 06/28/21 0722 07/02/21 1417   06/28/21 1800  cefTRIAXone (ROCEPHIN) 2 g in sodium chloride 0.9 % 100 mL IVPB        2 g 200 mL/hr over 30 Minutes Intravenous Every 24 hours 06/28/21 1419     06/28/21 1600  ceFEPIme (MAXIPIME) 2 g in sodium chloride 0.9 % 100 mL IVPB  Status:  Discontinued        2 g 200 mL/hr over 30 Minutes Intravenous Every 8 hours 06/28/21 0722 06/28/21 1419   06/28/21 0815  vancomycin (VANCOREADY) IVPB 2000 mg/400 mL        2,000 mg 200 mL/hr over 120 Minutes Intravenous  Once 06/28/21 0715 06/28/21 1032   06/28/21 0815  ceFEPIme (MAXIPIME) 2 g in sodium chloride 0.9 % 100 mL IVPB        2 g 200 mL/hr over 30 Minutes Intravenous  Once 06/28/21 0715 06/28/21 1117        Assessment/Plan POD 1 s/p excision of pilonidal cyst, placement of myriad thin graft  matrix and graft powder + wound vac for draining recurrent pilonidal cyst - Dr. Ninfa Linden, 07/03/2020 - Cont vac. Discussed with team. Plan to change next Wed and not replace - Cx pending. Gram stain with rare wbc present, both pmn and mononuclear; rare gram neg rods. Already on abx that should cover. ID following.  - Mobilize. OOB - Pulm toilet - Plan for homograft aortic root replacement tomorrow by Cardiothoracic   FEN - HH, NPO at midnight.  VTE - SCDs, subq heparin ID - Rocephin/Vanc  Aortic Valve Endocarditis - Per TCTS/Cardiology/ID  This care is post op   LOS: 7 days    Jillyn Ledger , [facility] Surgery 07/04/2021, 9:28 AM Please see Amion for pager number during day hours 7:00am-4:30pm

## 2021-07-04 NOTE — Progress Notes (Signed)
1 Day Post-Op Procedure(s) (LRB): EXCISIONAL DEBRIDEMENT CHRONIC PILONIDAL TRACT (N/A) Subjective: No fever after excisional  debridement of recurrent pilonidal cyst No dyspnea Objective: Vital signs in last 24 hours: Temp:  [98.1 F (36.7 C)-98.9 F (37.2 C)] 98.2 F (36.8 C) (02/23 0637) Pulse Rate:  [87-97] 92 (02/23 0637) Cardiac Rhythm: Normal sinus rhythm (02/22 1900) Resp:  [12-20] 16 (02/23 0637) BP: (113-134)/(47-73) 113/47 (02/23 0637) SpO2:  [95 %-97 %] 95 % (02/22 2005)  Hemodynamic parameters for last 24 hours:  nsr  Intake/Output from previous day: 02/22 0701 - 02/23 0700 In: 1360 [P.O.:360; I.V.:300; IV Piggyback:700] Out: 215 [Urine:200; Blood:15] Intake/Output this shift: Total I/O In: 180 [P.O.:180] Out: -   Cardiac murmur unchanged Wound vac compressed with mild serosanguinous drainage  Lab Results: Recent Labs    07/03/21 0605 07/04/21 0245  WBC 8.9 9.8  HGB 12.8* 11.8*  HCT 38.8* 35.7*  PLT 324 338   BMET:  Recent Labs    07/03/21 0605 07/04/21 0245  NA 139 136  K 3.9 3.2*  CL 103 104  CO2 26 23  GLUCOSE 107* 121*  BUN 14 12  CREATININE 0.79 0.70  CALCIUM 8.6* 8.3*    PT/INR: No results for input(s): LABPROT, INR in the last 72 hours. ABG No results found for: PHART, HCO3, TCO2, ACIDBASEDEF, O2SAT CBG (last 3)  No results for input(s): GLUCAP in the last 72 hours.  Assessment/Plan: S/P Procedure(s) (LRB): EXCISIONAL DEBRIDEMENT CHRONIC PILONIDAL TRACT (N/A) Plan homograft aortic root replacement tomorrow Benefits and risks have been discussed with patient and family   LOS: 7 days    Lovett Sox 07/04/2021

## 2021-07-05 ENCOUNTER — Encounter (HOSPITAL_COMMUNITY): Payer: Self-pay | Admitting: Family Medicine

## 2021-07-05 ENCOUNTER — Inpatient Hospital Stay (HOSPITAL_COMMUNITY): Payer: BC Managed Care – PPO | Admitting: Anesthesiology

## 2021-07-05 ENCOUNTER — Inpatient Hospital Stay (HOSPITAL_COMMUNITY): Payer: BC Managed Care – PPO

## 2021-07-05 ENCOUNTER — Encounter (HOSPITAL_COMMUNITY)
Admission: EM | Disposition: A | Discharge status: Home Health | Payer: Self-pay | Source: Home / Self Care | Attending: Cardiothoracic Surgery

## 2021-07-05 DIAGNOSIS — Z20822 Contact with and (suspected) exposure to covid-19: Secondary | ICD-10-CM | POA: Diagnosis not present

## 2021-07-05 DIAGNOSIS — I351 Nonrheumatic aortic (valve) insufficiency: Secondary | ICD-10-CM | POA: Diagnosis not present

## 2021-07-05 DIAGNOSIS — I083 Combined rheumatic disorders of mitral, aortic and tricuspid valves: Secondary | ICD-10-CM | POA: Diagnosis not present

## 2021-07-05 DIAGNOSIS — Q245 Malformation of coronary vessels: Secondary | ICD-10-CM | POA: Diagnosis not present

## 2021-07-05 DIAGNOSIS — I3139 Other pericardial effusion (noninflammatory): Secondary | ICD-10-CM | POA: Diagnosis not present

## 2021-07-05 DIAGNOSIS — A419 Sepsis, unspecified organism: Secondary | ICD-10-CM | POA: Diagnosis not present

## 2021-07-05 DIAGNOSIS — Z9889 Other specified postprocedural states: Secondary | ICD-10-CM

## 2021-07-05 DIAGNOSIS — I700 Atherosclerosis of aorta: Secondary | ICD-10-CM | POA: Diagnosis not present

## 2021-07-05 DIAGNOSIS — I517 Cardiomegaly: Secondary | ICD-10-CM | POA: Diagnosis not present

## 2021-07-05 DIAGNOSIS — I358 Other nonrheumatic aortic valve disorders: Secondary | ICD-10-CM | POA: Diagnosis not present

## 2021-07-05 DIAGNOSIS — D62 Acute posthemorrhagic anemia: Secondary | ICD-10-CM | POA: Diagnosis not present

## 2021-07-05 DIAGNOSIS — J939 Pneumothorax, unspecified: Secondary | ICD-10-CM | POA: Diagnosis not present

## 2021-07-05 HISTORY — PX: TEE WITHOUT CARDIOVERSION: SHX5443

## 2021-07-05 HISTORY — PX: ASCENDING AORTIC ROOT REPLACEMENT: SHX5729

## 2021-07-05 LAB — CBC
HCT: 34.8 % — ABNORMAL LOW (ref 39.0–52.0)
HCT: 27.2 % — ABNORMAL LOW (ref 39.0–52.0)
HCT: 26.8 % — ABNORMAL LOW (ref 39.0–52.0)
Hemoglobin: 11.8 g/dL — ABNORMAL LOW (ref 13.0–17.0)
Hemoglobin: 9 g/dL — ABNORMAL LOW (ref 13.0–17.0)
Hemoglobin: 8.6 g/dL — ABNORMAL LOW (ref 13.0–17.0)
MCH: 26.3 pg (ref 26.0–34.0)
MCH: 26.3 pg (ref 26.0–34.0)
MCH: 25.8 pg — ABNORMAL LOW (ref 26.0–34.0)
MCHC: 33.9 g/dL (ref 30.0–36.0)
MCHC: 33.1 g/dL (ref 30.0–36.0)
MCHC: 32.1 g/dL (ref 30.0–36.0)
MCV: 77.7 fL — ABNORMAL LOW (ref 80.0–100.0)
MCV: 79.5 fL — ABNORMAL LOW (ref 80.0–100.0)
MCV: 80.5 fL (ref 80.0–100.0)
Platelets: 307 10*3/uL (ref 150–400)
Platelets: 163 10*3/uL (ref 150–400)
Platelets: 184 10*3/uL (ref 150–400)
RBC: 4.48 MIL/uL (ref 4.22–5.81)
RBC: 3.42 MIL/uL — ABNORMAL LOW (ref 4.22–5.81)
RBC: 3.33 MIL/uL — ABNORMAL LOW (ref 4.22–5.81)
RDW: 13.7 % (ref 11.5–15.5)
RDW: 13.9 % (ref 11.5–15.5)
RDW: 14.1 % (ref 11.5–15.5)
WBC: 11.5 10*3/uL — ABNORMAL HIGH (ref 4.0–10.5)
WBC: 11.5 10*3/uL — ABNORMAL HIGH (ref 4.0–10.5)
WBC: 11.3 10*3/uL — ABNORMAL HIGH (ref 4.0–10.5)
nRBC: 0 % (ref 0.0–0.2)
nRBC: 0 % (ref 0.0–0.2)
nRBC: 0 % (ref 0.0–0.2)

## 2021-07-05 LAB — POCT I-STAT, CHEM 8
BUN: 9 mg/dL (ref 6–20)
BUN: 10 mg/dL (ref 6–20)
BUN: 11 mg/dL (ref 6–20)
BUN: 11 mg/dL (ref 6–20)
BUN: 11 mg/dL (ref 6–20)
BUN: 12 mg/dL (ref 6–20)
BUN: 12 mg/dL (ref 6–20)
BUN: 12 mg/dL (ref 6–20)
Calcium, Ion: 1.24 mmol/L (ref 1.15–1.40)
Calcium, Ion: 1.2 mmol/L (ref 1.15–1.40)
Calcium, Ion: 1.15 mmol/L (ref 1.15–1.40)
Calcium, Ion: 1.17 mmol/L (ref 1.15–1.40)
Calcium, Ion: 1.2 mmol/L (ref 1.15–1.40)
Calcium, Ion: 1.17 mmol/L (ref 1.15–1.40)
Calcium, Ion: 1.15 mmol/L (ref 1.15–1.40)
Calcium, Ion: 1.28 mmol/L (ref 1.15–1.40)
Chloride: 100 mmol/L (ref 98–111)
Chloride: 101 mmol/L (ref 98–111)
Chloride: 102 mmol/L (ref 98–111)
Chloride: 102 mmol/L (ref 98–111)
Chloride: 102 mmol/L (ref 98–111)
Chloride: 103 mmol/L (ref 98–111)
Chloride: 104 mmol/L (ref 98–111)
Chloride: 105 mmol/L (ref 98–111)
Creatinine, Ser: 0.6 mg/dL — ABNORMAL LOW (ref 0.61–1.24)
Creatinine, Ser: 0.6 mg/dL — ABNORMAL LOW (ref 0.61–1.24)
Creatinine, Ser: 0.6 mg/dL — ABNORMAL LOW (ref 0.61–1.24)
Creatinine, Ser: 0.6 mg/dL — ABNORMAL LOW (ref 0.61–1.24)
Creatinine, Ser: 0.6 mg/dL — ABNORMAL LOW (ref 0.61–1.24)
Creatinine, Ser: 0.6 mg/dL — ABNORMAL LOW (ref 0.61–1.24)
Creatinine, Ser: 0.7 mg/dL (ref 0.61–1.24)
Creatinine, Ser: 0.7 mg/dL (ref 0.61–1.24)
Glucose, Bld: 109 mg/dL — ABNORMAL HIGH (ref 70–99)
Glucose, Bld: 114 mg/dL — ABNORMAL HIGH (ref 70–99)
Glucose, Bld: 143 mg/dL — ABNORMAL HIGH (ref 70–99)
Glucose, Bld: 155 mg/dL — ABNORMAL HIGH (ref 70–99)
Glucose, Bld: 165 mg/dL — ABNORMAL HIGH (ref 70–99)
Glucose, Bld: 153 mg/dL — ABNORMAL HIGH (ref 70–99)
Glucose, Bld: 135 mg/dL — ABNORMAL HIGH (ref 70–99)
Glucose, Bld: 125 mg/dL — ABNORMAL HIGH (ref 70–99)
HCT: 37 % — ABNORMAL LOW (ref 39.0–52.0)
HCT: 31 % — ABNORMAL LOW (ref 39.0–52.0)
HCT: 27 % — ABNORMAL LOW (ref 39.0–52.0)
HCT: 28 % — ABNORMAL LOW (ref 39.0–52.0)
HCT: 26 % — ABNORMAL LOW (ref 39.0–52.0)
HCT: 26 % — ABNORMAL LOW (ref 39.0–52.0)
HCT: 26 % — ABNORMAL LOW (ref 39.0–52.0)
HCT: 27 % — ABNORMAL LOW (ref 39.0–52.0)
Hemoglobin: 12.6 g/dL — ABNORMAL LOW (ref 13.0–17.0)
Hemoglobin: 10.5 g/dL — ABNORMAL LOW (ref 13.0–17.0)
Hemoglobin: 9.2 g/dL — ABNORMAL LOW (ref 13.0–17.0)
Hemoglobin: 9.5 g/dL — ABNORMAL LOW (ref 13.0–17.0)
Hemoglobin: 8.8 g/dL — ABNORMAL LOW (ref 13.0–17.0)
Hemoglobin: 8.8 g/dL — ABNORMAL LOW (ref 13.0–17.0)
Hemoglobin: 8.8 g/dL — ABNORMAL LOW (ref 13.0–17.0)
Hemoglobin: 9.2 g/dL — ABNORMAL LOW (ref 13.0–17.0)
Potassium: 3.2 mmol/L — ABNORMAL LOW (ref 3.5–5.1)
Potassium: 3.5 mmol/L (ref 3.5–5.1)
Potassium: 4.7 mmol/L (ref 3.5–5.1)
Potassium: 4.2 mmol/L (ref 3.5–5.1)
Potassium: 4 mmol/L (ref 3.5–5.1)
Potassium: 3.8 mmol/L (ref 3.5–5.1)
Potassium: 4.7 mmol/L (ref 3.5–5.1)
Potassium: 3.9 mmol/L (ref 3.5–5.1)
Sodium: 138 mmol/L (ref 135–145)
Sodium: 137 mmol/L (ref 135–145)
Sodium: 136 mmol/L (ref 135–145)
Sodium: 136 mmol/L (ref 135–145)
Sodium: 137 mmol/L (ref 135–145)
Sodium: 136 mmol/L (ref 135–145)
Sodium: 137 mmol/L (ref 135–145)
Sodium: 139 mmol/L (ref 135–145)
TCO2: 26 mmol/L (ref 22–32)
TCO2: 26 mmol/L (ref 22–32)
TCO2: 26 mmol/L (ref 22–32)
TCO2: 26 mmol/L (ref 22–32)
TCO2: 27 mmol/L (ref 22–32)
TCO2: 26 mmol/L (ref 22–32)
TCO2: 25 mmol/L (ref 22–32)
TCO2: 25 mmol/L (ref 22–32)

## 2021-07-05 LAB — BASIC METABOLIC PANEL
Anion gap: 9 (ref 5–15)
Anion gap: 8 (ref 5–15)
BUN: 10 mg/dL (ref 6–20)
BUN: 12 mg/dL (ref 6–20)
CO2: 24 mmol/L (ref 22–32)
CO2: 23 mmol/L (ref 22–32)
Calcium: 8.8 mg/dL — ABNORMAL LOW (ref 8.9–10.3)
Calcium: 8 mg/dL — ABNORMAL LOW (ref 8.9–10.3)
Chloride: 102 mmol/L (ref 98–111)
Chloride: 105 mmol/L (ref 98–111)
Creatinine, Ser: 0.82 mg/dL (ref 0.61–1.24)
Creatinine, Ser: 0.72 mg/dL (ref 0.61–1.24)
GFR, Estimated: >60 mL/min (ref >60)
GFR, Estimated: >60 mL/min (ref >60)
Glucose, Bld: 105 mg/dL — ABNORMAL HIGH (ref 70–99)
Glucose, Bld: 161 mg/dL — ABNORMAL HIGH (ref 70–99)
Potassium: 3.3 mmol/L — ABNORMAL LOW (ref 3.5–5.1)
Potassium: 4.7 mmol/L (ref 3.5–5.1)
Sodium: 135 mmol/L (ref 135–145)
Sodium: 136 mmol/L (ref 135–145)

## 2021-07-05 LAB — POCT I-STAT 7, (LYTES, BLD GAS, ICA,H+H)
Acid-Base Excess: 1 mmol/L (ref 0.0–2.0)
Acid-Base Excess: 4 mmol/L — ABNORMAL HIGH (ref 0.0–2.0)
Acid-Base Excess: 0 mmol/L (ref 0.0–2.0)
Acid-base deficit: 1 mmol/L (ref 0.0–2.0)
Acid-base deficit: 1 mmol/L (ref 0.0–2.0)
Bicarbonate: 26.9 mmol/L (ref 20.0–28.0)
Bicarbonate: 28.7 mmol/L — ABNORMAL HIGH (ref 20.0–28.0)
Bicarbonate: 25.5 mmol/L (ref 20.0–28.0)
Bicarbonate: 24.8 mmol/L (ref 20.0–28.0)
Bicarbonate: 24.7 mmol/L (ref 20.0–28.0)
Calcium, Ion: 1.25 mmol/L (ref 1.15–1.40)
Calcium, Ion: 1.08 mmol/L — ABNORMAL LOW (ref 1.15–1.40)
Calcium, Ion: 1.27 mmol/L (ref 1.15–1.40)
Calcium, Ion: 1.27 mmol/L (ref 1.15–1.40)
Calcium, Ion: 1.22 mmol/L (ref 1.15–1.40)
HCT: 33 % — ABNORMAL LOW (ref 39.0–52.0)
HCT: 27 % — ABNORMAL LOW (ref 39.0–52.0)
HCT: 26 % — ABNORMAL LOW (ref 39.0–52.0)
HCT: 26 % — ABNORMAL LOW (ref 39.0–52.0)
HCT: 24 % — ABNORMAL LOW (ref 39.0–52.0)
Hemoglobin: 11.2 g/dL — ABNORMAL LOW (ref 13.0–17.0)
Hemoglobin: 9.2 g/dL — ABNORMAL LOW (ref 13.0–17.0)
Hemoglobin: 8.8 g/dL — ABNORMAL LOW (ref 13.0–17.0)
Hemoglobin: 8.8 g/dL — ABNORMAL LOW (ref 13.0–17.0)
Hemoglobin: 8.2 g/dL — ABNORMAL LOW (ref 13.0–17.0)
O2 Saturation: 100 %
O2 Saturation: 100 %
O2 Saturation: 100 %
O2 Saturation: 97 %
O2 Saturation: 100 %
Patient temperature: 36.4
Patient temperature: 36.2
Potassium: 3.3 mmol/L — ABNORMAL LOW (ref 3.5–5.1)
Potassium: 4.1 mmol/L (ref 3.5–5.1)
Potassium: 3.9 mmol/L (ref 3.5–5.1)
Potassium: 4.4 mmol/L (ref 3.5–5.1)
Potassium: 4.6 mmol/L (ref 3.5–5.1)
Sodium: 138 mmol/L (ref 135–145)
Sodium: 138 mmol/L (ref 135–145)
Sodium: 139 mmol/L (ref 135–145)
Sodium: 139 mmol/L (ref 135–145)
Sodium: 139 mmol/L (ref 135–145)
TCO2: 28 mmol/L (ref 22–32)
TCO2: 30 mmol/L (ref 22–32)
TCO2: 27 mmol/L (ref 22–32)
TCO2: 26 mmol/L (ref 22–32)
TCO2: 26 mmol/L (ref 22–32)
pCO2 arterial: 45.5 mmHg (ref 32–48)
pCO2 arterial: 42.6 mmHg (ref 32–48)
pCO2 arterial: 44.7 mmHg (ref 32–48)
pCO2 arterial: 43.5 mmHg (ref 32–48)
pCO2 arterial: 42.9 mmHg (ref 32–48)
pH, Arterial: 7.38 (ref 7.35–7.45)
pH, Arterial: 7.437 (ref 7.35–7.45)
pH, Arterial: 7.364 (ref 7.35–7.45)
pH, Arterial: 7.361 (ref 7.35–7.45)
pH, Arterial: 7.365 (ref 7.35–7.45)
pO2, Arterial: 193 mmHg — ABNORMAL HIGH (ref 83–108)
pO2, Arterial: 338 mmHg — ABNORMAL HIGH (ref 83–108)
pO2, Arterial: 352 mmHg — ABNORMAL HIGH (ref 83–108)
pO2, Arterial: 91 mmHg (ref 83–108)
pO2, Arterial: 197 mmHg — ABNORMAL HIGH (ref 83–108)

## 2021-07-05 LAB — POCT I-STAT EG7
Acid-base deficit: 4 mmol/L — ABNORMAL HIGH (ref 0.0–2.0)
Bicarbonate: 22.6 mmol/L (ref 20.0–28.0)
Calcium, Ion: 1.12 mmol/L — ABNORMAL LOW (ref 1.15–1.40)
HCT: 28 % — ABNORMAL LOW (ref 39.0–52.0)
Hemoglobin: 9.5 g/dL — ABNORMAL LOW (ref 13.0–17.0)
O2 Saturation: 66 %
Potassium: 3.8 mmol/L (ref 3.5–5.1)
Sodium: 144 mmol/L (ref 135–145)
TCO2: 24 mmol/L (ref 22–32)
pCO2, Ven: 46.2 mmHg (ref 44–60)
pH, Ven: 7.298 (ref 7.25–7.43)
pO2, Ven: 38 mmHg (ref 32–45)

## 2021-07-05 LAB — HEMOGLOBIN A1C
Hgb A1c MFr Bld: 5.3 % (ref 4.8–5.6)
Mean Plasma Glucose: 105 mg/dL

## 2021-07-05 LAB — ECHO INTRAOPERATIVE TEE
Height: 71 in
Weight: 3840 oz

## 2021-07-05 LAB — PLATELET COUNT: Platelets: 223 10*3/uL (ref 150–400)

## 2021-07-05 LAB — GLUCOSE, CAPILLARY
Glucose-Capillary: 116 mg/dL — ABNORMAL HIGH (ref 70–99)
Glucose-Capillary: 146 mg/dL — ABNORMAL HIGH (ref 70–99)
Glucose-Capillary: 157 mg/dL — ABNORMAL HIGH (ref 70–99)
Glucose-Capillary: 155 mg/dL — ABNORMAL HIGH (ref 70–99)
Glucose-Capillary: 142 mg/dL — ABNORMAL HIGH (ref 70–99)

## 2021-07-05 LAB — MAGNESIUM
Magnesium: 1.8 mg/dL (ref 1.7–2.4)
Magnesium: 2.9 mg/dL — ABNORMAL HIGH (ref 1.7–2.4)

## 2021-07-05 LAB — FIBRINOGEN: Fibrinogen: 443 mg/dL (ref 210–475)

## 2021-07-05 LAB — APTT: aPTT: 31 seconds (ref 24–36)

## 2021-07-05 LAB — HEMOGLOBIN AND HEMATOCRIT, BLOOD
HCT: 26.8 % — ABNORMAL LOW (ref 39.0–52.0)
Hemoglobin: 9.1 g/dL — ABNORMAL LOW (ref 13.0–17.0)

## 2021-07-05 LAB — PROTIME-INR
INR: 1.6 — ABNORMAL HIGH (ref 0.8–1.2)
Prothrombin Time: 19.2 seconds — ABNORMAL HIGH (ref 11.4–15.2)

## 2021-07-05 SURGERY — ASCENDING AORTIC ROOT REPLACEMENT
Anesthesia: General

## 2021-07-05 MED ORDER — TRAMADOL HCL 50 MG PO TABS
50.0000 mg | ORAL_TABLET | ORAL | Status: DC | PRN
  Administered 2021-07-07 (×3): 100 mg via ORAL
  Filled 2021-07-05 (×3): qty 2

## 2021-07-05 MED ORDER — PANTOPRAZOLE SODIUM 40 MG PO TBEC
40.0000 mg | DELAYED_RELEASE_TABLET | Freq: Every day | ORAL | Status: DC
  Administered 2021-07-07 – 2021-07-12 (×6): 40 mg via ORAL
  Filled 2021-07-05 (×6): qty 1

## 2021-07-05 MED ORDER — HEPARIN SODIUM (PORCINE) 1000 UNIT/ML IJ SOLN
INTRAMUSCULAR | Status: DC | PRN
  Administered 2021-07-05: 34000 [IU] via INTRAVENOUS

## 2021-07-05 MED ORDER — PROTAMINE SULFATE 10 MG/ML IV SOLN
INTRAVENOUS | Status: AC
  Filled 2021-07-05: qty 10

## 2021-07-05 MED ORDER — FENTANYL CITRATE (PF) 250 MCG/5ML IJ SOLN
INTRAMUSCULAR | Status: AC
  Filled 2021-07-05: qty 5

## 2021-07-05 MED ORDER — PROPOFOL 10 MG/ML IV BOLUS
INTRAVENOUS | Status: AC
  Filled 2021-07-05: qty 20

## 2021-07-05 MED ORDER — GLUTARALDEHYDE 0.625% SOAKING SOLUTION
TOPICAL | Status: DC | PRN
  Administered 2021-07-05: 1

## 2021-07-05 MED ORDER — ONDANSETRON HCL 4 MG/2ML IJ SOLN
4.0000 mg | Freq: Four times a day (QID) | INTRAMUSCULAR | Status: DC | PRN
  Administered 2021-07-06 – 2021-07-09 (×6): 4 mg via INTRAVENOUS
  Filled 2021-07-05 (×6): qty 2

## 2021-07-05 MED ORDER — METOPROLOL TARTRATE 25 MG/10 ML ORAL SUSPENSION: 12.5000 mg | Freq: Two times a day (BID) | ORAL | Status: DC

## 2021-07-05 MED ORDER — CHLORHEXIDINE GLUCONATE CLOTH 2 % EX PADS
6.0000 | MEDICATED_PAD | Freq: Every day | CUTANEOUS | Status: DC
  Administered 2021-07-05 – 2021-07-12 (×8): 6 via TOPICAL

## 2021-07-05 MED ORDER — MAGNESIUM SULFATE 4 GM/100ML IV SOLN
4.0000 g | Freq: Once | INTRAVENOUS | Status: AC
  Administered 2021-07-05: 4 g via INTRAVENOUS
  Filled 2021-07-05: qty 100

## 2021-07-05 MED ORDER — PROTAMINE SULFATE 10 MG/ML IV SOLN
INTRAVENOUS | Status: DC | PRN
  Administered 2021-07-05: 250 mg via INTRAVENOUS
  Administered 2021-07-05: 30 mg via INTRAVENOUS
  Administered 2021-07-05: 20 mg via INTRAVENOUS

## 2021-07-05 MED ORDER — SODIUM CHLORIDE 0.9% FLUSH
3.0000 mL | INTRAVENOUS | Status: DC | PRN
  Administered 2021-07-05: 3 mL via INTRAVENOUS

## 2021-07-05 MED ORDER — LACTATED RINGERS IV SOLN: INTRAVENOUS | Status: DC | PRN

## 2021-07-05 MED ORDER — 0.9 % SODIUM CHLORIDE (POUR BTL) OPTIME
TOPICAL | Status: DC | PRN
Start: 2021-07-05 — End: 2021-07-05
  Administered 2021-07-05: 6000 mL

## 2021-07-05 MED ORDER — ALBUMIN HUMAN 5 % IV SOLN
250.0000 mL | INTRAVENOUS | Status: AC | PRN
  Administered 2021-07-05 – 2021-07-06 (×3): 12.5 g via INTRAVENOUS
  Filled 2021-07-05 (×2): qty 250

## 2021-07-05 MED ORDER — CHLORHEXIDINE GLUCONATE 0.12 % MT SOLN
15.0000 mL | OROMUCOSAL | Status: AC
  Administered 2021-07-05: 15 mL via OROMUCOSAL

## 2021-07-05 MED ORDER — TRANEXAMIC ACID 1000 MG/10ML IV SOLN
1.5000 mg/kg/h | INTRAVENOUS | Status: DC
  Filled 2021-07-05 (×2): qty 25

## 2021-07-05 MED ORDER — FENTANYL CITRATE (PF) 250 MCG/5ML IJ SOLN
INTRAMUSCULAR | Status: DC | PRN
  Administered 2021-07-05: 50 ug via INTRAVENOUS
  Administered 2021-07-05: 200 ug via INTRAVENOUS
  Administered 2021-07-05: 100 ug via INTRAVENOUS
  Administered 2021-07-05: 200 ug via INTRAVENOUS
  Administered 2021-07-05: 250 ug via INTRAVENOUS
  Administered 2021-07-05 (×2): 50 ug via INTRAVENOUS
  Administered 2021-07-05: 100 ug via INTRAVENOUS

## 2021-07-05 MED ORDER — VASOPRESSIN 20 UNIT/ML IV SOLN
INTRAVENOUS | Status: AC
  Filled 2021-07-05: qty 1

## 2021-07-05 MED ORDER — SODIUM CHLORIDE 0.9% FLUSH: 10.0000 mL | INTRAVENOUS | Status: DC | PRN

## 2021-07-05 MED ORDER — SODIUM CHLORIDE 0.9% FLUSH
10.0000 mL | Freq: Two times a day (BID) | INTRAVENOUS | Status: DC
  Administered 2021-07-05 – 2021-07-08 (×6): 10 mL

## 2021-07-05 MED ORDER — FAMOTIDINE IN NACL 20-0.9 MG/50ML-% IV SOLN
20.0000 mg | Freq: Two times a day (BID) | INTRAVENOUS | Status: DC
  Filled 2021-07-05: qty 50

## 2021-07-05 MED ORDER — OXYCODONE HCL 5 MG PO TABS
5.0000 mg | ORAL_TABLET | ORAL | Status: DC | PRN
  Administered 2021-07-06 – 2021-07-12 (×17): 10 mg via ORAL
  Filled 2021-07-05 (×17): qty 2

## 2021-07-05 MED ORDER — LIDOCAINE 2% (20 MG/ML) 5 ML SYRINGE
INTRAMUSCULAR | Status: AC
  Filled 2021-07-05: qty 5

## 2021-07-05 MED ORDER — MILRINONE LACTATE IN DEXTROSE 20-5 MG/100ML-% IV SOLN
0.2500 ug/kg/min | INTRAVENOUS | Status: DC
  Administered 2021-07-06 – 2021-07-08 (×4): 0.25 ug/kg/min via INTRAVENOUS
  Filled 2021-07-05 (×5): qty 100

## 2021-07-05 MED ORDER — VANCOMYCIN HCL IN DEXTROSE 1-5 GM/200ML-% IV SOLN: 1000.0000 mg | Freq: Once | INTRAVENOUS | Status: DC

## 2021-07-05 MED ORDER — VANCOMYCIN HCL 1500 MG/300ML IV SOLN
1500.0000 mg | Freq: Two times a day (BID) | INTRAVENOUS | Status: DC
  Administered 2021-07-05 – 2021-07-08 (×6): 1500 mg via INTRAVENOUS
  Filled 2021-07-05 (×6): qty 300

## 2021-07-05 MED ORDER — PLASMA-LYTE A IV SOLN
INTRAVENOUS | Status: DC | PRN
  Administered 2021-07-05: 500 mL via INTRAVASCULAR

## 2021-07-05 MED ORDER — ACETAMINOPHEN 500 MG PO TABS
1000.0000 mg | ORAL_TABLET | Freq: Four times a day (QID) | ORAL | Status: AC
  Administered 2021-07-06 – 2021-07-10 (×15): 1000 mg via ORAL
  Filled 2021-07-05 (×17): qty 2

## 2021-07-05 MED ORDER — PROTAMINE SULFATE 10 MG/ML IV SOLN
INTRAVENOUS | Status: AC
  Filled 2021-07-05: qty 50

## 2021-07-05 MED ORDER — ASPIRIN 81 MG PO CHEW
324.0000 mg | CHEWABLE_TABLET | Freq: Every day | ORAL | Status: DC
  Administered 2021-07-06: 324 mg
  Filled 2021-07-05 (×2): qty 4

## 2021-07-05 MED ORDER — METHYLPREDNISOLONE SODIUM SUCC 125 MG IJ SOLR
80.0000 mg | Freq: Once | INTRAMUSCULAR | Status: AC
  Administered 2021-07-05: 80 mg via INTRAVENOUS
  Filled 2021-07-05: qty 2

## 2021-07-05 MED ORDER — METOPROLOL TARTRATE 5 MG/5ML IV SOLN: 2.5000 mg | INTRAVENOUS | Status: DC | PRN

## 2021-07-05 MED ORDER — SODIUM CHLORIDE 0.9 % IV SOLN: 250.0000 mL | INTRAVENOUS | Status: DC

## 2021-07-05 MED ORDER — METOPROLOL TARTRATE 12.5 MG HALF TABLET
12.5000 mg | ORAL_TABLET | Freq: Two times a day (BID) | ORAL | Status: DC
  Administered 2021-07-06: 12.5 mg via ORAL
  Filled 2021-07-05: qty 1

## 2021-07-05 MED ORDER — SODIUM CHLORIDE (PF) 0.9 % IJ SOLN
INTRAMUSCULAR | Status: AC
  Filled 2021-07-05: qty 10

## 2021-07-05 MED ORDER — HEMOSTATIC AGENTS (NO CHARGE) OPTIME
TOPICAL | Status: DC | PRN
  Administered 2021-07-05 (×4): 1 via TOPICAL

## 2021-07-05 MED ORDER — POTASSIUM CHLORIDE 10 MEQ/50ML IV SOLN: 10.0000 meq | INTRAVENOUS | Status: AC

## 2021-07-05 MED ORDER — AMIODARONE HCL IN DEXTROSE 360-4.14 MG/200ML-% IV SOLN
INTRAVENOUS | Status: DC | PRN
  Administered 2021-07-05: 60 mg/h via INTRAVENOUS

## 2021-07-05 MED ORDER — SODIUM CHLORIDE 0.9 % IV SOLN: INTRAVENOUS | Status: DC

## 2021-07-05 MED ORDER — SODIUM CHLORIDE 0.9 % IV SOLN: INTRAVENOUS | Status: DC | PRN

## 2021-07-05 MED ORDER — AMIODARONE HCL IN DEXTROSE 360-4.14 MG/200ML-% IV SOLN
30.0000 mg/h | INTRAVENOUS | Status: DC
Start: 2021-07-05 — End: 2021-07-07
  Administered 2021-07-06: 30 mg/h via INTRAVENOUS
  Filled 2021-07-05: qty 200

## 2021-07-05 MED ORDER — DIPHENHYDRAMINE HCL 50 MG/ML IJ SOLN
25.0000 mg | Freq: Once | INTRAMUSCULAR | Status: AC
  Administered 2021-07-05: 25 mg via INTRAVENOUS
  Filled 2021-07-05: qty 1

## 2021-07-05 MED ORDER — DOCUSATE SODIUM 100 MG PO CAPS
200.0000 mg | ORAL_CAPSULE | Freq: Every day | ORAL | Status: DC
  Administered 2021-07-07 – 2021-07-12 (×4): 200 mg via ORAL
  Filled 2021-07-05 (×6): qty 2

## 2021-07-05 MED ORDER — MIDAZOLAM HCL 2 MG/2ML IJ SOLN
2.0000 mg | INTRAMUSCULAR | Status: DC | PRN
  Administered 2021-07-05 – 2021-07-06 (×3): 2 mg via INTRAVENOUS
  Filled 2021-07-05 (×3): qty 2

## 2021-07-05 MED ORDER — ACETAMINOPHEN 160 MG/5ML PO SOLN
1000.0000 mg | Freq: Four times a day (QID) | ORAL | Status: AC
  Administered 2021-07-05 – 2021-07-06 (×3): 1000 mg
  Filled 2021-07-05 (×3): qty 40.6

## 2021-07-05 MED ORDER — MIDAZOLAM HCL (PF) 10 MG/2ML IJ SOLN
INTRAMUSCULAR | Status: AC
  Filled 2021-07-05: qty 2

## 2021-07-05 MED ORDER — LACTATED RINGERS IV SOLN: 500.0000 mL | Freq: Once | INTRAVENOUS | Status: DC | PRN

## 2021-07-05 MED ORDER — BISACODYL 5 MG PO TBEC
10.0000 mg | DELAYED_RELEASE_TABLET | Freq: Every day | ORAL | Status: DC
  Administered 2021-07-07 – 2021-07-12 (×4): 10 mg via ORAL
  Filled 2021-07-05 (×6): qty 2

## 2021-07-05 MED ORDER — INSULIN REGULAR(HUMAN) IN NACL 100-0.9 UT/100ML-% IV SOLN: INTRAVENOUS | Status: DC

## 2021-07-05 MED ORDER — CEFAZOLIN SODIUM-DEXTROSE 2-4 GM/100ML-% IV SOLN: 2.0000 g | Freq: Three times a day (TID) | INTRAVENOUS | Status: DC

## 2021-07-05 MED ORDER — MILRINONE LACTATE IN DEXTROSE 20-5 MG/100ML-% IV SOLN: 0.3000 ug/kg/min | INTRAVENOUS | Status: DC

## 2021-07-05 MED ORDER — ROCURONIUM BROMIDE 10 MG/ML (PF) SYRINGE
PREFILLED_SYRINGE | INTRAVENOUS | Status: AC
  Filled 2021-07-05: qty 10

## 2021-07-05 MED ORDER — FAMOTIDINE IN NACL 20-0.9 MG/50ML-% IV SOLN
20.0000 mg | Freq: Two times a day (BID) | INTRAVENOUS | Status: AC
Start: 2021-07-05 — End: 2021-07-06
  Administered 2021-07-05 – 2021-07-06 (×2): 20 mg via INTRAVENOUS
  Filled 2021-07-05: qty 50

## 2021-07-05 MED ORDER — SODIUM CHLORIDE 0.9% FLUSH
3.0000 mL | Freq: Two times a day (BID) | INTRAVENOUS | Status: DC
  Administered 2021-07-06 – 2021-07-08 (×4): 3 mL via INTRAVENOUS

## 2021-07-05 MED ORDER — SODIUM CHLORIDE 0.45 % IV SOLN: INTRAVENOUS | Status: DC | PRN

## 2021-07-05 MED ORDER — ACETAMINOPHEN 160 MG/5ML PO SOLN: 650.0000 mg | Freq: Once | ORAL | Status: AC

## 2021-07-05 MED ORDER — MORPHINE SULFATE (PF) 2 MG/ML IV SOLN
1.0000 mg | INTRAVENOUS | Status: DC | PRN
  Administered 2021-07-05: 4 mg via INTRAVENOUS
  Administered 2021-07-06: 2 mg via INTRAVENOUS
  Administered 2021-07-06: 4 mg via INTRAVENOUS
  Filled 2021-07-05: qty 2
  Filled 2021-07-05: qty 1
  Filled 2021-07-05 (×2): qty 2

## 2021-07-05 MED ORDER — MIDAZOLAM HCL (PF) 5 MG/ML IJ SOLN
INTRAMUSCULAR | Status: DC | PRN
  Administered 2021-07-05 (×2): 1 mg via INTRAVENOUS
  Administered 2021-07-05: 2 mg via INTRAVENOUS
  Administered 2021-07-05: 4 mg via INTRAVENOUS
  Administered 2021-07-05: 2 mg via INTRAVENOUS

## 2021-07-05 MED ORDER — DEXMEDETOMIDINE HCL IN NACL 400 MCG/100ML IV SOLN
0.4000 ug/kg/h | INTRAVENOUS | Status: DC
  Administered 2021-07-05: 1.2 ug/kg/h via INTRAVENOUS
  Administered 2021-07-05: 1.1 ug/kg/h via INTRAVENOUS
  Administered 2021-07-06: 0.5 ug/kg/h via INTRAVENOUS
  Administered 2021-07-06: 1.2 ug/kg/h via INTRAVENOUS
  Administered 2021-07-06: 1.1 ug/kg/h via INTRAVENOUS
  Administered 2021-07-06: 1.2 ug/kg/h via INTRAVENOUS
  Filled 2021-07-05 (×6): qty 100

## 2021-07-05 MED ORDER — FENTANYL 2500MCG IN NS 250ML (10MCG/ML) PREMIX INFUSION
0.0000 ug/h | INTRAVENOUS | Status: DC
  Administered 2021-07-05: 25 ug/h via INTRAVENOUS
  Filled 2021-07-05: qty 250

## 2021-07-05 MED ORDER — NOREPINEPHRINE 4 MG/250ML-% IV SOLN: 0.0000 ug/min | INTRAVENOUS | Status: DC

## 2021-07-05 MED ORDER — NITROGLYCERIN IN D5W 200-5 MCG/ML-% IV SOLN: 0.0000 ug/min | INTRAVENOUS | Status: DC

## 2021-07-05 MED ORDER — ACETAMINOPHEN 650 MG RE SUPP
650.0000 mg | Freq: Once | RECTAL | Status: AC
  Administered 2021-07-05: 650 mg via RECTAL

## 2021-07-05 MED ORDER — PROPOFOL 10 MG/ML IV BOLUS
INTRAVENOUS | Status: DC | PRN
  Administered 2021-07-05: 50 mg via INTRAVENOUS
  Administered 2021-07-05: 150 mg via INTRAVENOUS

## 2021-07-05 MED ORDER — LACTATED RINGERS IV SOLN: INTRAVENOUS | Status: DC

## 2021-07-05 MED ORDER — ROCURONIUM BROMIDE 10 MG/ML (PF) SYRINGE
PREFILLED_SYRINGE | INTRAVENOUS | Status: DC | PRN
  Administered 2021-07-05 (×4): 50 mg via INTRAVENOUS
  Administered 2021-07-05: 100 mg via INTRAVENOUS

## 2021-07-05 MED ORDER — SODIUM CHLORIDE 0.9 % IV SOLN
2.0000 g | INTRAVENOUS | Status: DC
  Administered 2021-07-05 – 2021-07-12 (×8): 2 g via INTRAVENOUS
  Filled 2021-07-05 (×9): qty 20

## 2021-07-05 MED ORDER — HEPARIN SODIUM (PORCINE) 1000 UNIT/ML IJ SOLN
INTRAMUSCULAR | Status: AC
  Filled 2021-07-05: qty 1

## 2021-07-05 MED ORDER — DEXMEDETOMIDINE HCL IN NACL 400 MCG/100ML IV SOLN: 0.0000 ug/kg/h | INTRAVENOUS | Status: DC

## 2021-07-05 MED ORDER — AMIODARONE HCL IN DEXTROSE 360-4.14 MG/200ML-% IV SOLN
60.0000 mg/h | INTRAVENOUS | Status: DC
  Administered 2021-07-05: 60 mg/h via INTRAVENOUS
  Filled 2021-07-05: qty 200

## 2021-07-05 MED ORDER — DEXTROSE 50 % IV SOLN: 0.0000 mL | INTRAVENOUS | Status: DC | PRN

## 2021-07-05 MED ORDER — SODIUM CHLORIDE (PF) 0.9 % IJ SOLN
OROMUCOSAL | Status: DC | PRN
  Administered 2021-07-05 (×3): 4 mL via TOPICAL

## 2021-07-05 MED ORDER — BISACODYL 10 MG RE SUPP: 10.0000 mg | Freq: Every day | RECTAL | Status: DC

## 2021-07-05 MED ORDER — PHENYLEPHRINE 40 MCG/ML (10ML) SYRINGE FOR IV PUSH (FOR BLOOD PRESSURE SUPPORT)
PREFILLED_SYRINGE | INTRAVENOUS | Status: DC | PRN
  Administered 2021-07-05: 40 ug via INTRAVENOUS

## 2021-07-05 MED ORDER — PHENYLEPHRINE HCL-NACL 20-0.9 MG/250ML-% IV SOLN: 0.0000 ug/min | INTRAVENOUS | Status: DC

## 2021-07-05 MED ORDER — ASPIRIN EC 325 MG PO TBEC: 325.0000 mg | DELAYED_RELEASE_TABLET | Freq: Every day | ORAL | Status: DC

## 2021-07-05 SURGICAL SUPPLY — 107 items
ADAPTER CARDIO PERF ANTE/RETRO (ADAPTER) ×2 IMPLANT
APPLICATOR TIP COSEAL (VASCULAR PRODUCTS) ×2 IMPLANT
BAG DECANTER FOR FLEXI CONT (MISCELLANEOUS) ×2 IMPLANT
BLADE CLIPPER SURG (BLADE) ×1 IMPLANT
BLADE STERNUM SYSTEM 6 (BLADE) ×2 IMPLANT
BLADE SURG 12 STRL SS (BLADE) ×2 IMPLANT
BNDG ELASTIC 4X5.8 VLCR STR LF (GAUZE/BANDAGES/DRESSINGS) ×1 IMPLANT
BNDG ELASTIC 6X5.8 VLCR STR LF (GAUZE/BANDAGES/DRESSINGS) ×1 IMPLANT
BNDG GAUZE ELAST 4 BULKY (GAUZE/BANDAGES/DRESSINGS) ×1 IMPLANT
CANISTER SUCT 3000ML PPV (MISCELLANEOUS) ×2 IMPLANT
CANNULA GUNDRY RCSP 15FR (MISCELLANEOUS) ×2 IMPLANT
CATH CPB KIT VANTRIGT (MISCELLANEOUS) ×2 IMPLANT
CATH ROBINSON RED A/P 18FR (CATHETERS) ×7 IMPLANT
CATH THORACIC 28FR RT ANG (CATHETERS) ×2 IMPLANT
CATH/SQUID NICHOLS JEHLE COR (CATHETERS) ×1 IMPLANT
CNTNR URN SCR LID CUP LEK RST (MISCELLANEOUS) IMPLANT
CONT SPEC 4OZ STRL OR WHT (MISCELLANEOUS) ×3
CONTAINER PROTECT SURGISLUSH (MISCELLANEOUS) ×6 IMPLANT
DRAIN CHANNEL 32F RND 10.7 FF (WOUND CARE) ×2 IMPLANT
DRAPE CARDIOVASCULAR INCISE (DRAPES) ×1
DRAPE HALF SHEET 40X57 (DRAPES) ×1 IMPLANT
DRAPE SLUSH/WARMER DISC (DRAPES) ×1 IMPLANT
DRAPE SRG 135X102X78XABS (DRAPES) ×1 IMPLANT
DRSG AQUACEL AG ADV 3.5X14 (GAUZE/BANDAGES/DRESSINGS) ×2 IMPLANT
ELECT BLADE 4.0 EZ CLEAN MEGAD (MISCELLANEOUS) ×2
ELECT BLADE 6.5 EXT (BLADE) ×2 IMPLANT
ELECT CAUTERY BLADE 6.4 (BLADE) ×2 IMPLANT
ELECT REM PT RETURN 9FT ADLT (ELECTROSURGICAL) ×4
ELECTRODE BLDE 4.0 EZ CLN MEGD (MISCELLANEOUS) ×1 IMPLANT
ELECTRODE REM PT RTRN 9FT ADLT (ELECTROSURGICAL) ×2 IMPLANT
FELT TEFLON 1X6 (MISCELLANEOUS) ×6 IMPLANT
GAUZE 4X4 16PLY ~~LOC~~+RFID DBL (SPONGE) ×2 IMPLANT
GAUZE SPONGE 4X4 12PLY STRL (GAUZE/BANDAGES/DRESSINGS) ×3 IMPLANT
GAUZE SPONGE 4X4 12PLY STRL LF (GAUZE/BANDAGES/DRESSINGS) ×1 IMPLANT
GLOVE SURG ENC MOIS LTX SZ7.5 (GLOVE) ×6 IMPLANT
GLOVE SURG MICRO LTX SZ7.5 (GLOVE) ×1 IMPLANT
GOWN STRL REUS W/ TWL LRG LVL3 (GOWN DISPOSABLE) ×4 IMPLANT
GOWN STRL REUS W/TWL LRG LVL3 (GOWN DISPOSABLE) ×8
GRAFT VALVE AORTIC CRYO 25 (Prosthesis & Implant Heart) IMPLANT
HEMOSTAT POWDER SURGIFOAM 1G (HEMOSTASIS) ×6 IMPLANT
HEMOSTAT SURGICEL 2X14 (HEMOSTASIS) ×2 IMPLANT
INSERT FOGARTY XLG (MISCELLANEOUS) IMPLANT
KIT BASIN OR (CUSTOM PROCEDURE TRAY) ×2 IMPLANT
KIT SUCTION CATH 14FR (SUCTIONS) ×2 IMPLANT
KIT TURNOVER KIT B (KITS) ×2 IMPLANT
KIT VASOVIEW HEMOPRO 2 VH 4000 (KITS) ×1 IMPLANT
LEAD PACING MYOCARDI (MISCELLANEOUS) ×2 IMPLANT
LINE VENT (MISCELLANEOUS) ×1 IMPLANT
MARKER GRAFT CORONARY BYPASS (MISCELLANEOUS) ×3 IMPLANT
NS IRRIG 1000ML POUR BTL (IV SOLUTION) ×11 IMPLANT
PACK E OPEN HEART (SUTURE) ×2 IMPLANT
PACK OPEN HEART (CUSTOM PROCEDURE TRAY) ×2 IMPLANT
PAD ARMBOARD 7.5X6 YLW CONV (MISCELLANEOUS) ×4 IMPLANT
PAD ELECT DEFIB RADIOL ZOLL (MISCELLANEOUS) ×2 IMPLANT
PENCIL BUTTON HOLSTER BLD 10FT (ELECTRODE) ×2 IMPLANT
POSITIONER HEAD DONUT 9IN (MISCELLANEOUS) ×2 IMPLANT
POWDER SURGICEL 3.0 GRAM (HEMOSTASIS) ×2 IMPLANT
PUNCH AORTIC ROTATE  4.5MM 8IN (MISCELLANEOUS) ×1 IMPLANT
PUNCH AORTIC ROTATE 4.0MM (MISCELLANEOUS) IMPLANT
PUNCH AORTIC ROTATE 4.5MM 8IN (MISCELLANEOUS) IMPLANT
PUNCH AORTIC ROTATE 5MM 8IN (MISCELLANEOUS) IMPLANT
SEALANT SURG COSEAL 8ML (VASCULAR PRODUCTS) ×1 IMPLANT
SET MPS 3-ND DEL (MISCELLANEOUS) ×1 IMPLANT
SPONGE T-LAP 18X18 ~~LOC~~+RFID (SPONGE) ×8 IMPLANT
SPONGE T-LAP 4X18 ~~LOC~~+RFID (SPONGE) ×2 IMPLANT
SUPPORT HEART JANKE-BARRON (MISCELLANEOUS) ×1 IMPLANT
SURGIFLO W/THROMBIN 8M KIT (HEMOSTASIS) ×4 IMPLANT
SUT BONE WAX W31G (SUTURE) ×2 IMPLANT
SUT ETHIBOND 4 0 RB 1 (SUTURE) ×4 IMPLANT
SUT MNCRL AB 4-0 PS2 18 (SUTURE) IMPLANT
SUT PROLENE 3 0 RB 1 (SUTURE) ×1 IMPLANT
SUT PROLENE 3 0 SH DA (SUTURE) IMPLANT
SUT PROLENE 3 0 SH1 36 (SUTURE) IMPLANT
SUT PROLENE 4 0 RB 1 (SUTURE) ×18
SUT PROLENE 4 0 SH DA (SUTURE) ×7 IMPLANT
SUT PROLENE 4-0 RB1 .5 CRCL 36 (SUTURE) ×1 IMPLANT
SUT PROLENE 5 0 C 1 36 (SUTURE) ×7 IMPLANT
SUT PROLENE 6 0 C 1 30 (SUTURE) ×7 IMPLANT
SUT PROLENE 6 0 CC (SUTURE) ×7 IMPLANT
SUT PROLENE 8 0 BV175 6 (SUTURE) IMPLANT
SUT PROLENE BLUE 7 0 (SUTURE) ×1 IMPLANT
SUT SILK  1 MH (SUTURE)
SUT SILK 1 MH (SUTURE) IMPLANT
SUT SILK 2 0 SH CR/8 (SUTURE) ×1 IMPLANT
SUT SILK 3 0 SH CR/8 (SUTURE) IMPLANT
SUT STEEL 6MS V (SUTURE) ×2 IMPLANT
SUT STEEL SZ 6 DBL 3X14 BALL (SUTURE) ×1 IMPLANT
SUT VIC AB 1 CTX 36 (SUTURE) ×2
SUT VIC AB 1 CTX36XBRD ANBCTR (SUTURE) ×2 IMPLANT
SUT VIC AB 2-0 CT1 27 (SUTURE)
SUT VIC AB 2-0 CT1 TAPERPNT 27 (SUTURE) IMPLANT
SUT VIC AB 2-0 CTX 27 (SUTURE) IMPLANT
SUT VIC AB 3-0 X1 27 (SUTURE) IMPLANT
SWAB COLLECTION DEVICE MRSA (MISCELLANEOUS) ×1 IMPLANT
SWAB CULTURE ESWAB REG 1ML (MISCELLANEOUS) ×1 IMPLANT
SYSTEM SAHARA CHEST DRAIN ATS (WOUND CARE) ×2 IMPLANT
TAPE CLOTH SURG 4X10 WHT LF (GAUZE/BANDAGES/DRESSINGS) ×1 IMPLANT
TAPE PAPER 2X10 WHT MICROPORE (GAUZE/BANDAGES/DRESSINGS) ×1 IMPLANT
TOWEL GREEN STERILE (TOWEL DISPOSABLE) ×2 IMPLANT
TOWEL GREEN STERILE FF (TOWEL DISPOSABLE) ×2 IMPLANT
TRAY FOLEY SLVR 16FR TEMP STAT (SET/KITS/TRAYS/PACK) ×2 IMPLANT
TUBE CONNECTING 20X1/4 (TUBING) ×1 IMPLANT
TUBING LAP HI FLOW INSUFFLATIO (TUBING) ×1 IMPLANT
UNDERPAD 30X36 HEAVY ABSORB (UNDERPADS AND DIAPERS) ×2 IMPLANT
VALVE AORTIC CRYO 25MM (Prosthesis & Implant Heart) ×2 IMPLANT
WATER STERILE IRR 1000ML POUR (IV SOLUTION) ×4 IMPLANT
YANKAUER SUCT BULB TIP NO VENT (SUCTIONS) ×2 IMPLANT

## 2021-07-05 NOTE — Brief Op Note (Signed)
06/27/2021 - 07/05/2021  2:48 PM  PATIENT:  Martin Stafford  31 y.o. male  PRE-OPERATIVE DIAGNOSIS:  AI, Endocarditis, sub-annular abscess  POST-OPERATIVE DIAGNOSIS:  AI, Endocarditis, sub-annular abscess  PROCEDURE:  Procedure(s): Homograft aortic root replacement using 53mm Cryo Aortic Valve. (N/A) TRANSESOPHAGEAL ECHOCARDIOGRAM (TEE) (N/A) Debridement and closure of sub-annular abscess  SURGEON:  Surgeon(s) and Role:    Lovett Sox, MD - Primary  PHYSICIAN ASSISTANT: MYRON RODDENBERRY PA-C; WAYNE GOLD PA-C  ASSISTANTS: STAFF   ANESTHESIA:   general  EBL:  500 ml   BLOOD ADMINISTERED: 2 FFP and 1 PLTS  DRAINS:  MEDIASTINAL CHEST TUBES    LOCAL MEDICATIONS USED:  NONE  SPECIMEN:  Source of Specimen:  AORTIC VALVE LEAFLETS  DISPOSITION OF SPECIMEN:   PATH/MICRO  Infected tissue collected for [place]  micrbiology lab for RNA analysis  COUNTS:  YES  TOURNIQUET:  * No tourniquets in log *  DICTATION: .Other Dictation: Dictation Number PENDING  PLAN OF CARE: Admit to inpatient   PATIENT DISPOSITION:  ICU - intubated and hemodynamically stable.   Delay start of Pharmacological VTE agent (>24hrs) due to surgical blood loss or risk of bleeding: yes  COMPLICATIONS: NO KNOWN

## 2021-07-05 NOTE — OR Nursing (Signed)
Forty-five minute call to 2 Heart Charge Nurse at [phone].

## 2021-07-05 NOTE — Anesthesia Procedure Notes (Signed)
Arterial Line Insertion Start/End2/24/2023 7:15 AM, 07/05/2021 7:15 AM  Patient location: Pre-op. Preanesthetic checklist: patient identified, IV checked, site marked, risks and benefits discussed, surgical consent, monitors and equipment checked, pre-op evaluation, timeout performed and anesthesia consent Lidocaine 1% used for infiltration Left, radial was placed Catheter size: 20 G Hand hygiene performed  and maximum sterile barriers used   Attempts: 3 Procedure performed without using ultrasound guided technique. Following insertion, dressing applied and Biopatch. Post procedure assessment: normal and unchanged

## 2021-07-05 NOTE — Transfer of Care (Signed)
Immediate Anesthesia Transfer of Care Note  Patient: Martin Stafford  Procedure(s) Performed: Homograft aortic root replacement using 20mm Cryo Aortic Valve. TRANSESOPHAGEAL ECHOCARDIOGRAM (TEE)  Patient Location: ICU  Anesthesia Type:General  Level of Consciousness: sedated and Patient remains intubated per anesthesia plan  Airway & Oxygen Therapy: Patient remains intubated per anesthesia plan and Patient placed on Ventilator (see vital sign flow sheet for setting)  Post-op Assessment: Report given to RN and Post -op Vital signs reviewed and stable  Post vital signs: Reviewed and stable  Last Vitals:  Vitals Value Taken Time  BP 113/54 07/05/21 1634  Temp 36.5 C 07/05/21 1636  Pulse 84 07/05/21 1636  Resp 18 07/05/21 1636  SpO2 97 % 07/05/21 1636  Vitals shown include unvalidated device data.  Last Pain:  Vitals:   07/04/21 2210  TempSrc:   PainSc: 0-No pain      Patients Stated Pain Goal: 2 ([id number])  Complications: No notable events documented.

## 2021-07-05 NOTE — Anesthesia Procedure Notes (Addendum)
Central Venous Catheter Insertion Performed by: Shelton Silvas, MD, anesthesiologist Start/End2/24/2023 7:00 AM, 07/05/2021 7:10 AM Patient location: Pre-op. Preanesthetic checklist: patient identified, IV checked, site marked, risks and benefits discussed, surgical consent, monitors and equipment checked, pre-op evaluation, timeout performed and anesthesia consent Position: Trendelenburg Lidocaine 1% used for infiltration and patient sedated Hand hygiene performed , maximum sterile barriers used  and Seldinger technique used Catheter size: 9 Fr Total catheter length 10. Central line was placed.Sheath introducer Swan type:thermodilution PA Cath depth:50 Procedure performed using ultrasound guided technique. Ultrasound Notes:anatomy identified, needle tip was noted to be adjacent to the nerve/plexus identified, no ultrasound evidence of intravascular and/or intraneural injection and image(s) printed for medical record Attempts: 1 Following insertion, line sutured, dressing applied and Biopatch. Post procedure assessment: blood return through all ports, free fluid flow and no air  Patient tolerated the procedure well with no immediate complications.

## 2021-07-05 NOTE — Anesthesia Preprocedure Evaluation (Addendum)
Anesthesia Evaluation  Patient identified by MRN, date of birth, ID band Patient awake    Reviewed: Allergy & Precautions, NPO status , Patient's Chart, lab work & pertinent test results  Airway Mallampati: III  TM Distance: >3 FB Neck ROM: Full    Dental  (+) Teeth Intact, Dental Advisory Given   Pulmonary neg pulmonary ROS,    breath sounds clear to auscultation       Cardiovascular + Valvular Problems/Murmurs  Rhythm:Regular Rate:Normal  Echo: 1. Left ventricular ejection fraction, by estimation, is 60 to 65%. The  left ventricle has normal function. There is moderate left ventricular  hypertrophy.  2. Right ventricular systolic function is normal. The right ventricular  size is normal.  3. No left atrial/left atrial appendage thrombus was detected.  4. A small pericardial effusion is present.  5. The mitral valve is normal in structure. Trivial mitral valve  regurgitation.  6. The noncoronary cusp of the aortic valve is abnormally thickened. The  basal portion has an almost fenestrated appearance, concerning for abscess  formation. Aortic regurgitation is eccentric and severe with backflow from  descending aorta.. The aortic  valve is tricuspid   Neuro/Psych negative neurological ROS  negative psych ROS   GI/Hepatic negative GI ROS, Neg liver ROS,   Endo/Other  negative endocrine ROS  Renal/GU negative Renal ROS     Musculoskeletal   Abdominal Normal abdominal exam  (+)   Peds  Hematology   Anesthesia Other Findings   Reproductive/Obstetrics                            Anesthesia Physical Anesthesia Plan  ASA: 4  Anesthesia Plan: General   Post-op Pain Management:    Induction: Intravenous  PONV Risk Score and Plan: 2 and Ondansetron and Midazolam  Airway Management Planned: Oral ETT  Additional Equipment: Arterial line, CVP, PA Cath, TEE and Ultrasound Guidance Line  Placement  Intra-op Plan:   Post-operative Plan: Post-operative intubation/ventilation  Informed Consent: I have reviewed the patients History and Physical, chart, labs and discussed the procedure including the risks, benefits and alternatives for the proposed anesthesia with the patient or authorized representative who has indicated his/her understanding and acceptance.     Dental advisory given  Plan Discussed with: CRNA  Anesthesia Plan Comments:        Anesthesia Quick Evaluation

## 2021-07-05 NOTE — Progress Notes (Signed)
Tube pulled back to 23 per order from Dr Alla German.

## 2021-07-05 NOTE — Anesthesia Postprocedure Evaluation (Signed)
Anesthesia Post Note  Patient: Martin Stafford  Procedure(s) Performed: Homograft aortic root replacement using 15mm Cryo Aortic Valve. TRANSESOPHAGEAL ECHOCARDIOGRAM (TEE)     Patient location during evaluation: SICU Anesthesia Type: General Level of consciousness: sedated Pain management: pain level controlled Vital Signs Assessment: post-procedure vital signs reviewed and stable Respiratory status: patient remains intubated per anesthesia plan Cardiovascular status: stable Postop Assessment: no apparent nausea or vomiting Anesthetic complications: no   No notable events documented.              Shelton Silvas

## 2021-07-05 NOTE — OR Nursing (Signed)
Twenty minute call to 2 Heart Charge Nurse at [phone]. Spoke to [place]. Cath Lab also notified of timing. Spoke to [place].

## 2021-07-05 NOTE — Anesthesia Procedure Notes (Signed)
Central Venous Catheter Insertion Performed by: Shelton Silvas, MD, anesthesiologist Start/End2/24/2023 7:10 AM, 07/05/2021 7:15 AM Patient location: Pre-op. Preanesthetic checklist: patient identified, IV checked, site marked, risks and benefits discussed, surgical consent, monitors and equipment checked, pre-op evaluation, timeout performed and anesthesia consent Hand hygiene performed  and maximum sterile barriers used  PA cath was placed.Swan type:thermodilution Procedure performed without using ultrasound guided technique. Attempts: 1 Patient tolerated the procedure well with no immediate complications.

## 2021-07-05 NOTE — Progress Notes (Signed)
Patient in OR for aortic valve and root replacement   I walked the paperwork over to micro lab team for UW specimen submission in preparation for valve tissue to be received from surgery today. This will not go out until Monday.   There is also a copy in media tab if needed.   Will follow after surgery.   So far intra op gram stains are negative x 2 specimens.   Dr snider is available this weekend for any I D questions and will check in with him post op.   Janene Madeira, NP

## 2021-07-05 NOTE — Progress Notes (Signed)
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 rate 14, height 5\' 11"  (1.803 m), weight 108.9 kg, SpO2 100 %.

## 2021-07-05 NOTE — Anesthesia Procedure Notes (Signed)
Procedure Name: Intubation Date/Time: 07/05/2021 8:06 AM Performed by: Erick Colace, CRNA Pre-anesthesia Checklist: Patient identified, Emergency Drugs available, Suction available and Patient being monitored Patient Re-evaluated:Patient Re-evaluated prior to induction Oxygen Delivery Method: Circle system utilized Preoxygenation: Pre-oxygenation with 100% oxygen Induction Type: IV induction Ventilation: Mask ventilation without difficulty and Oral airway inserted - appropriate to patient size Laryngoscope Size: Mac and 4 Grade View: Grade II Tube type: Oral Tube size: 8.0 mm Number of attempts: 1 Airway Equipment and Method: Stylet and Oral airway Placement Confirmation: ETT inserted through vocal cords under direct vision, positive ETCO2 and breath sounds checked- equal and bilateral Secured at: 24 cm Tube secured with: Tape Dental Injury: Teeth and Oropharynx as per pre-operative assessment

## 2021-07-05 NOTE — Progress Notes (Signed)
Pre Procedure note for inpatients:   Martin Stafford has been scheduled for Procedure(s): Homograft aortic root replacement (N/A) TRANSESOPHAGEAL ECHOCARDIOGRAM (TEE) (N/A) today. The various methods of treatment have been discussed with the patient. After consideration of the risks, benefits and treatment options the patient has consented to the planned procedure.   The patient has been seen and labs reviewed. There are no changes in the patients condition to prevent proceeding with the planned procedure today.  Recent labs:  Lab Results  Component Value Date   WBC 11.5 (H) 07/05/2021   HGB 11.8 (L) 07/05/2021   HCT 34.8 (L) 07/05/2021   PLT 307 07/05/2021   GLUCOSE 105 (H) 07/05/2021   ALT 24 06/27/2021   AST 25 06/27/2021   NA 135 07/05/2021   K 3.3 (L) 07/05/2021   CL 102 07/05/2021   CREATININE 0.82 07/05/2021   BUN 10 07/05/2021   CO2 24 07/05/2021   TSH 1.260 07/02/2021   INR 1.2 07/04/2021   HGBA1C 5.3 07/04/2021    Lovett Sox, MD 07/05/2021 7:29 AM

## 2021-07-06 ENCOUNTER — Inpatient Hospital Stay (HOSPITAL_COMMUNITY): Payer: BC Managed Care – PPO

## 2021-07-06 DIAGNOSIS — D62 Acute posthemorrhagic anemia: Secondary | ICD-10-CM | POA: Diagnosis not present

## 2021-07-06 DIAGNOSIS — R079 Chest pain, unspecified: Secondary | ICD-10-CM | POA: Diagnosis not present

## 2021-07-06 DIAGNOSIS — Q245 Malformation of coronary vessels: Secondary | ICD-10-CM | POA: Diagnosis not present

## 2021-07-06 DIAGNOSIS — A419 Sepsis, unspecified organism: Secondary | ICD-10-CM | POA: Diagnosis not present

## 2021-07-06 DIAGNOSIS — I358 Other nonrheumatic aortic valve disorders: Secondary | ICD-10-CM | POA: Diagnosis not present

## 2021-07-06 DIAGNOSIS — Z20822 Contact with and (suspected) exposure to covid-19: Secondary | ICD-10-CM | POA: Diagnosis not present

## 2021-07-06 LAB — BPAM FFP
Blood Product Expiration Date: 202302282359
Blood Product Expiration Date: 202303012359
Blood Product Expiration Date: 202303012359
ISSUE DATE / TIME: 202302241353
ISSUE DATE / TIME: 202302241353
ISSUE DATE / TIME: 202302241815
Unit Type and Rh: 600
Unit Type and Rh: 600
Unit Type and Rh: 6200

## 2021-07-06 LAB — BASIC METABOLIC PANEL
Anion gap: 7 (ref 5–15)
Anion gap: 10 (ref 5–15)
BUN: 12 mg/dL (ref 6–20)
BUN: 17 mg/dL (ref 6–20)
CO2: 24 mmol/L (ref 22–32)
CO2: 23 mmol/L (ref 22–32)
Calcium: 8.4 mg/dL — ABNORMAL LOW (ref 8.9–10.3)
Calcium: 8.5 mg/dL — ABNORMAL LOW (ref 8.9–10.3)
Chloride: 106 mmol/L (ref 98–111)
Chloride: 104 mmol/L (ref 98–111)
Creatinine, Ser: 0.79 mg/dL (ref 0.61–1.24)
Creatinine, Ser: 0.72 mg/dL (ref 0.61–1.24)
GFR, Estimated: >60 mL/min (ref >60)
GFR, Estimated: >60 mL/min (ref >60)
Glucose, Bld: 143 mg/dL — ABNORMAL HIGH (ref 70–99)
Glucose, Bld: 196 mg/dL — ABNORMAL HIGH (ref 70–99)
Potassium: 4.7 mmol/L (ref 3.5–5.1)
Potassium: 3.8 mmol/L (ref 3.5–5.1)
Sodium: 137 mmol/L (ref 135–145)
Sodium: 137 mmol/L (ref 135–145)

## 2021-07-06 LAB — BPAM PLATELET PHERESIS
Blood Product Expiration Date: 202302262359
ISSUE DATE / TIME: 202302241346
Unit Type and Rh: 5100

## 2021-07-06 LAB — POCT I-STAT 7, (LYTES, BLD GAS, ICA,H+H)
Acid-Base Excess: 0 mmol/L (ref 0.0–2.0)
Acid-Base Excess: 0 mmol/L (ref 0.0–2.0)
Acid-Base Excess: 0 mmol/L (ref 0.0–2.0)
Acid-Base Excess: 4 mmol/L — ABNORMAL HIGH (ref 0.0–2.0)
Bicarbonate: 25.3 mmol/L (ref 20.0–28.0)
Bicarbonate: 26.4 mmol/L (ref 20.0–28.0)
Bicarbonate: 24.9 mmol/L (ref 20.0–28.0)
Bicarbonate: 28.9 mmol/L — ABNORMAL HIGH (ref 20.0–28.0)
Calcium, Ion: 1.26 mmol/L (ref 1.15–1.40)
Calcium, Ion: 1.29 mmol/L (ref 1.15–1.40)
Calcium, Ion: 1.28 mmol/L (ref 1.15–1.40)
Calcium, Ion: 1.26 mmol/L (ref 1.15–1.40)
HCT: 25 % — ABNORMAL LOW (ref 39.0–52.0)
HCT: 25 % — ABNORMAL LOW (ref 39.0–52.0)
HCT: 24 % — ABNORMAL LOW (ref 39.0–52.0)
HCT: 23 % — ABNORMAL LOW (ref 39.0–52.0)
Hemoglobin: 8.5 g/dL — ABNORMAL LOW (ref 13.0–17.0)
Hemoglobin: 8.5 g/dL — ABNORMAL LOW (ref 13.0–17.0)
Hemoglobin: 8.2 g/dL — ABNORMAL LOW (ref 13.0–17.0)
Hemoglobin: 7.8 g/dL — ABNORMAL LOW (ref 13.0–17.0)
O2 Saturation: 100 %
O2 Saturation: 98 %
O2 Saturation: 96 %
O2 Saturation: 97 %
Patient temperature: 37.2
Patient temperature: 36.8
Patient temperature: 36.6
Patient temperature: 36.5
Potassium: 4.7 mmol/L (ref 3.5–5.1)
Potassium: 4.6 mmol/L (ref 3.5–5.1)
Potassium: 4.1 mmol/L (ref 3.5–5.1)
Potassium: 4.1 mmol/L (ref 3.5–5.1)
Sodium: 139 mmol/L (ref 135–145)
Sodium: 139 mmol/L (ref 135–145)
Sodium: 139 mmol/L (ref 135–145)
Sodium: 137 mmol/L (ref 135–145)
TCO2: 27 mmol/L (ref 22–32)
TCO2: 28 mmol/L (ref 22–32)
TCO2: 26 mmol/L (ref 22–32)
TCO2: 30 mmol/L (ref 22–32)
pCO2 arterial: 44.7 mmHg (ref 32–48)
pCO2 arterial: 51.9 mmHg — ABNORMAL HIGH (ref 32–48)
pCO2 arterial: 40.9 mmHg (ref 32–48)
pCO2 arterial: 45.7 mmHg (ref 32–48)
pH, Arterial: 7.361 (ref 7.35–7.45)
pH, Arterial: 7.313 — ABNORMAL LOW (ref 7.35–7.45)
pH, Arterial: 7.391 (ref 7.35–7.45)
pH, Arterial: 7.406 (ref 7.35–7.45)
pO2, Arterial: 182 mmHg — ABNORMAL HIGH (ref 83–108)
pO2, Arterial: 107 mmHg (ref 83–108)
pO2, Arterial: 84 mmHg (ref 83–108)
pO2, Arterial: 86 mmHg (ref 83–108)

## 2021-07-06 LAB — MAGNESIUM
Magnesium: 2.7 mg/dL — ABNORMAL HIGH (ref 1.7–2.4)
Magnesium: 2.3 mg/dL (ref 1.7–2.4)

## 2021-07-06 LAB — GLUCOSE, CAPILLARY
Glucose-Capillary: 113 mg/dL — ABNORMAL HIGH (ref 70–99)
Glucose-Capillary: 117 mg/dL — ABNORMAL HIGH (ref 70–99)
Glucose-Capillary: 123 mg/dL — ABNORMAL HIGH (ref 70–99)
Glucose-Capillary: 125 mg/dL — ABNORMAL HIGH (ref 70–99)
Glucose-Capillary: 130 mg/dL — ABNORMAL HIGH (ref 70–99)
Glucose-Capillary: 131 mg/dL — ABNORMAL HIGH (ref 70–99)
Glucose-Capillary: 134 mg/dL — ABNORMAL HIGH (ref 70–99)
Glucose-Capillary: 136 mg/dL — ABNORMAL HIGH (ref 70–99)
Glucose-Capillary: 138 mg/dL — ABNORMAL HIGH (ref 70–99)
Glucose-Capillary: 141 mg/dL — ABNORMAL HIGH (ref 70–99)
Glucose-Capillary: 142 mg/dL — ABNORMAL HIGH (ref 70–99)
Glucose-Capillary: 143 mg/dL — ABNORMAL HIGH (ref 70–99)
Glucose-Capillary: 144 mg/dL — ABNORMAL HIGH (ref 70–99)

## 2021-07-06 LAB — CBC
HCT: 26.5 % — ABNORMAL LOW (ref 39.0–52.0)
HCT: 26.1 % — ABNORMAL LOW (ref 39.0–52.0)
Hemoglobin: 8.8 g/dL — ABNORMAL LOW (ref 13.0–17.0)
Hemoglobin: 8.4 g/dL — ABNORMAL LOW (ref 13.0–17.0)
MCH: 26.6 pg (ref 26.0–34.0)
MCH: 26.1 pg (ref 26.0–34.0)
MCHC: 33.2 g/dL (ref 30.0–36.0)
MCHC: 32.2 g/dL (ref 30.0–36.0)
MCV: 80.1 fL (ref 80.0–100.0)
MCV: 81.1 fL (ref 80.0–100.0)
Platelets: 181 10*3/uL (ref 150–400)
Platelets: 188 10*3/uL (ref 150–400)
RBC: 3.31 MIL/uL — ABNORMAL LOW (ref 4.22–5.81)
RBC: 3.22 MIL/uL — ABNORMAL LOW (ref 4.22–5.81)
RDW: 14.1 % (ref 11.5–15.5)
RDW: 14.3 % (ref 11.5–15.5)
WBC: 8.4 10*3/uL (ref 4.0–10.5)
WBC: 8.6 10*3/uL (ref 4.0–10.5)
nRBC: 0 % (ref 0.0–0.2)
nRBC: 0 % (ref 0.0–0.2)

## 2021-07-06 LAB — PREPARE PLATELET PHERESIS: Unit division: 0

## 2021-07-06 LAB — PREPARE FRESH FROZEN PLASMA
Unit division: 0
Unit division: 0
Unit division: 0

## 2021-07-06 MED ORDER — ORAL CARE MOUTH RINSE
15.0000 mL | OROMUCOSAL | Status: DC
  Administered 2021-07-06 (×4): 15 mL via OROMUCOSAL

## 2021-07-06 MED ORDER — INSULIN ASPART 100 UNIT/ML IJ SOLN
0.0000 [IU] | INTRAMUSCULAR | Status: DC
  Administered 2021-07-06 (×2): 2 [IU] via SUBCUTANEOUS

## 2021-07-06 MED ORDER — METHYLPREDNISOLONE SODIUM SUCC 125 MG IJ SOLR
80.0000 mg | Freq: Once | INTRAMUSCULAR | Status: AC
  Administered 2021-07-06: 80 mg via INTRAVENOUS
  Filled 2021-07-06: qty 2

## 2021-07-06 MED ORDER — FUROSEMIDE 10 MG/ML IJ SOLN
20.0000 mg | Freq: Two times a day (BID) | INTRAMUSCULAR | Status: DC
  Administered 2021-07-06 – 2021-07-07 (×3): 20 mg via INTRAVENOUS
  Filled 2021-07-06 (×3): qty 2

## 2021-07-06 MED ORDER — CHLORHEXIDINE GLUCONATE 0.12% ORAL RINSE (MEDLINE KIT)
15.0000 mL | Freq: Two times a day (BID) | OROMUCOSAL | Status: DC
  Administered 2021-07-06 – 2021-07-07 (×3): 15 mL via OROMUCOSAL

## 2021-07-06 MED ORDER — INSULIN ASPART 100 UNIT/ML IJ SOLN: 0.0000 [IU] | INTRAMUSCULAR | Status: DC

## 2021-07-06 MED ORDER — ORAL CARE MOUTH RINSE
15.0000 mL | Freq: Two times a day (BID) | OROMUCOSAL | Status: DC
  Administered 2021-07-06 – 2021-07-12 (×10): 15 mL via OROMUCOSAL

## 2021-07-06 NOTE — Progress Notes (Signed)
VC 3.4 L NIF 25

## 2021-07-06 NOTE — Progress Notes (Signed)
1 Day Post-Op Procedure(s) (LRB): Homograft aortic root replacement using 49mm Cryo Aortic Valve. (N/A) TRANSESOPHAGEAL ECHOCARDIOGRAM (TEE) (N/A) Subjective: Sedated but responsive on vent Nsr CI 2.0 CXR clear Facial swelling after unit FFP yesterday pm- will redose solumedrol. Start postop lasix Objective: Vital signs in last 24 hours: Temp:  [97 F (36.1 C)-99.3 F (37.4 C)] 99.3 F (37.4 C) (02/25 0800) Pulse Rate:  [69-84] 69 (02/25 0800) Cardiac Rhythm: A-V Sequential paced (02/24 2000) Resp:  [0-29] 19 (02/25 0800) BP: (85-114)/(54-70) 91/60 (02/25 0800) SpO2:  [97 %-100 %] 100 % (02/25 0836) Arterial Line BP: (91-152)/(49-95) 139/61 (02/25 0800) FiO2 (%):  [40 %-50 %] 40 % (02/25 0836) Weight:  [114.9 kg] 114.9 kg (02/25 0400)  Hemodynamic parameters for last 24 hours: PAP: (26-43)/(9-24) 31/15 CO:  [3.7 L/min-4.9 L/min] 4.4 L/min CI:  [1.6 L/min/m2-2.1 L/min/m2] 1.9 L/min/m2  Intake/Output from previous day: 02/24 0701 - 02/25 0700 In: 7143.5 [I.V.:3558.9; Blood:2099; NG/GT:180; IV Piggyback:1245.6] Out: 4815 [Urine:2640; Emesis/NG output:200; Drains:25; Blood:1600; Chest Tube:350] Intake/Output this shift: Total I/O In: 265 [I.V.:265] Out: 80 [Urine:80]       Exam    General- alert and comfortable    Neck- no JVD, no cervical adenopathy palpable, no carotid bruit   Lungs- clear without rales, wheezes   Cor- regular rate and rhythm, soft systolic flow murmur murmur , gallop   Abdomen- soft, non-tender   Extremities - warm, non-tender, minimal edema   Neuro- oriented, appropriate, no focal weakness   Lab Results: Recent Labs    07/05/21 2100 07/05/21 2159 07/06/21 0326 07/06/21 0330  WBC 11.3*  --   --  8.4  HGB 8.6*   < > 8.5* 8.8*  HCT 26.8*   < > 25.0* 26.5*  PLT 184  --   --  181   < > = values in this interval not displayed.   BMET:  Recent Labs    07/05/21 2100 07/05/21 2159 07/06/21 0326 07/06/21 0330  NA 136   < > 139 137  K 4.7    < > 4.7 4.7  CL 105  --   --  106  CO2 23  --   --  24  GLUCOSE 161*  --   --  143*  BUN 12  --   --  12  CREATININE 0.72  --   --  0.79  CALCIUM 8.0*  --   --  8.4*   < > = values in this interval not displayed.    PT/INR:  Recent Labs    07/05/21 1640  LABPROT 19.2*  INR 1.6*   ABG    Component Value Date/Time   PHART 7.361 07/06/2021 0326   HCO3 25.3 07/06/2021 0326   TCO2 27 07/06/2021 0326   ACIDBASEDEF 1.0 07/05/2021 2159   O2SAT 100 07/06/2021 0326   CBG (last 3)  Recent Labs    07/06/21 0626 07/06/21 0735 07/06/21 0848  GLUCAP 125* 142* 136*    Assessment/Plan: S/P Procedure(s) (LRB): Homograft aortic root replacement using 38mm Cryo Aortic Valve. (N/A) TRANSESOPHAGEAL ECHOCARDIOGRAM (TEE) (N/A) Diuresis Wean vent and extubate Cont vanc, cephtriaxone until OR cultures are reported   LOS: 9 days    Lovett Sox 07/06/2021

## 2021-07-06 NOTE — Progress Notes (Signed)
CT surgery PM rounds  Patient extubated this afternoon with good ABG Noted side of bed with assistance Maintaining sinus rhythm and adequate hemodynamics P.m. labs reviewed and are satisfactory-creatinine 0.7 potassium 3.8 hemoglobin 8.4 platelets 188 CO2 23  Blood pressure 104/76, pulse 90, temperature 98.1 F (36.7 C), resp. rate (!) 22, height 5\' 11"  (1.803 m), weight 114.9 kg, SpO2 98 %.

## 2021-07-06 NOTE — Progress Notes (Signed)
Patient ID: Martin Stafford, male   DOB: [birth year], 31 y.o.   MRN: [id number]   Wound VAC maintaining suctioning on gluteal cleft surgery sit. Will see again Monday Plan to remove the [facility] on Wednesday

## 2021-07-06 NOTE — Progress Notes (Signed)
°   07/06/21 0730  Airway 8 mm  Placement Date/Time: 07/05/21 (c) 0806   Grade View: Grade 2  Airway Device: Endotracheal Tube  Laryngoscope Blade: MAC;4  ETT Types: Oral  Size (mm): 8 mm  Cuffed: Min.occ.pres.  Insertion attempts: 1  Airway Equipment: Stylet  Placement Confirmation...  Secured at (cm) 23 cm  Measured From Lips  Secured Location Right (unable to move swollen lip)  Secured By [employer] fundraiser  Prone position No  Site Condition Edema  Adult Ventilator Settings  Vent Type Servo i  Humidity HME  Vent Mode SIMV;PSV  Vt Set 600 mL  Set Rate 12 bmp  FiO2 (%) 50 %  I Time 0.9 Sec(s)  Pressure Support 10 cmH20  PEEP 5 cmH20  Adult Ventilator Measurements  Peak Airway Pressure 22 L/min  Mean Airway Pressure 18 cmH20  Plateau Pressure 20 cmH20  Resp Rate Spontaneous 6 br/min  Resp Rate Total 18 br/min  Exhaled Vt 598 mL  Measured Ve 10.2 mL  Auto PEEP 0 cmH20  Total PEEP 5 cmH20  SpO2 100 %  Adult Ventilator Alarms  Alarms On Y  Ve High Alarm 21 L/min  Ve Low Alarm 5 L/min  Resp Rate High Alarm 38 br/min  Resp Rate Low Alarm 8  PEEP Low Alarm 3 cmH2O  Press High Alarm 40 cmH2O  T Apnea 20 sec(s)  Breath Sounds  Bilateral Breath Sounds Diminished  Airway Suctioning/Secretions  Suction Type ETT  Suction Device  Catheter  Secretion Amount Moderate  Secretion Color White  Secretion Consistency Thick  Suction Tolerance Tolerated well  Suctioning Adverse Effects None

## 2021-07-06 NOTE — Progress Notes (Signed)
[facility]  Total days of antibiotics 9/vanco and ceftriaxone              Principal Problem:   Endocarditis of aortic valve Active Problems:   Chest pain   Severe aortic insufficiency   Fever   Hidradenitis   Skin ulcer of buttock (HCC)   Sepsis ([place])   Aortic valve abscess   Encounter for preoperative dental examination   Caries   Accretions on teeth   Impacted third molar tooth   Hx of repair of aortic root    HPI: Martin Stafford is a 31 y.o. 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 Angioedema from plasma infusion requiring dose of steroids. This morning still intubated. Remains afebrile.  MEDICATIONS:  acetaminophen  1,000 mg Oral Q6H   Or   acetaminophen (TYLENOL) oral liquid 160 mg/5 mL  1,000 mg Per Tube Q6H   aspirin EC  325 mg Oral Daily   Or   aspirin  324 mg Per Tube Daily   bisacodyl  10 mg Oral Daily   Or   bisacodyl  10 mg Rectal Daily   chlorhexidine gluconate (MEDLINE KIT)  15 mL Mouth Rinse BID   Chlorhexidine Gluconate Cloth  6 each Topical Daily   docusate sodium  200 mg Oral Daily   furosemide  20 mg Intravenous BID   insulin aspart  0-24 Units Subcutaneous Q4H   mouth rinse  15 mL Mouth Rinse 10 times per day   metoprolol tartrate  12.5 mg Oral BID   Or   metoprolol tartrate  12.5 mg Per Tube BID   [START ON 07/07/2021] pantoprazole  40 mg Oral Daily   sodium chloride flush  10-40 mL Intracatheter Q12H   sodium chloride flush  3 mL Intravenous Q12H    OBJECTIVE: Temp:  [97 F (36.1 C)-99.3 F (37.4 C)] 98.4 F (36.9 C) (02/25 1430) Pulse Rate:  [68-84] 74 (02/25 1430) Resp:  [0-29] 18 (02/25 1430) BP: (83-114)/(54-70) 83/54 (02/25 1430) SpO2:  [97 %-100 %] 99 % (02/25 1430) Arterial  Line BP: (91-168)/(49-95) 141/58 (02/25 1430) FiO2 (%):  [40 %-50 %] 40 % (02/25 1432) Weight:  [114.9 kg] 114.9 kg (02/25 0400) Physical Exam  Constitutional: He is oriented to person, place, and time. He appears well-developed and well-nourished. No distress.  HENT:  Mouth/Throat: Oropharynx is clear and moist. No oropharyngeal exudate. IJ line covered Cardiovascular: Normal rate, regular rhythm and normal heart sounds. Exam reveals no gallop and no friction rub.  No murmur heard. Pacerwires in place. Pulmonary/Chest: Effort normal and breath sounds normal. No respiratory distress. He has no wheezes. Chest tube Abdominal: Soft. Bowel sounds are normal. He exhibits no distension. There is no tenderness.  Lymphadenopathy:  He has no cervical adenopathy.  Neurological: He is alert and oriented to person, place, and time.  Skin: Skin is warm and dry. No rash noted. No erythema.  Psychiatric: He has a normal mood and affect. His behavior is normal.    LABS: Results for orders placed or performed during the hospital encounter of 06/27/21 (from the past 48 hour(s))  Basic metabolic panel     Status: Abnormal   Collection Time: 07/04/21  4:08 PM  Result Value Ref Range   Sodium 137 135 -  145 mmol/L   Potassium 4.0 3.5 - 5.1 mmol/L    Comment: DELTA CHECK NOTED   Chloride 103 98 - 111 mmol/L   CO2 26 22 - 32 mmol/L   Glucose, Bld 94 70 - 99 mg/dL    Comment: Glucose reference range applies only to samples taken after fasting for at least 8 hours.   BUN 10 6 - 20 mg/dL   Creatinine, Ser 0.78 0.61 - 1.24 mg/dL   Calcium 8.6 (L) 8.9 - 10.3 mg/dL   GFR, Estimated >60 >60 mL/min    Comment: (NOTE) Calculated using the CKD-EPI Creatinine Equation (2021)    Anion gap 8 5 - 15    Comment: Performed at [facility] [address] [place], [place] [address]  Hemoglobin A1c     Status: None   Collection Time: 07/04/21  6:23 PM  Result Value Ref Range   Hgb A1c MFr Bld 5.3 4.8 - 5.6 %     Comment: (NOTE)         Prediabetes: 5.7 - 6.4         Diabetes: >6.4         Glycemic control for adults with diabetes: <7.0    Mean Plasma Glucose 105 mg/dL    Comment: (NOTE) Performed At: [facility] [place], [place] [id number] Rush Farmer MD [id number]   Protime-INR     Status: None   Collection Time: 07/04/21  6:23 PM  Result Value Ref Range   Prothrombin Time 14.9 11.4 - 15.2 seconds   INR 1.2 0.8 - 1.2    Comment: (NOTE) INR goal varies based on device and disease states. Performed at [facility], [place] [address] [place], [place] [address]   APTT     Status: None   Collection Time: 07/04/21  6:23 PM  Result Value Ref Range   aPTT 29 24 - 36 seconds    Comment: Performed at [facility] [address], [place], [place] [address]  Blood gas, arterial     Status: Abnormal   Collection Time: 07/04/21  6:23 PM  Result Value Ref Range   FIO2 21.00 %   pH, Arterial 7.45 7.35 - 7.45   pCO2 arterial 39 32 - 48 mmHg   pO2, Arterial 80 (L) 83 - 108 mmHg   Bicarbonate 27.8 20.0 - 28.0 mmol/L   Acid-Base Excess 3.5 (H) 0.0 - 2.0 mmol/L   O2 Saturation 98 %   Patient temperature 36.7    Collection site LEFT RADIAL    Drawn by [id number]    Allens test (pass/fail) PASS PASS    Comment: Performed at [facility] [address], [place], [place] [address]  Type and screen Lily     Status: None (Preliminary result)   Collection Time: 07/04/21  6:26 PM  Result Value Ref Range   ABO/RH(D) O POS    Antibody Screen NEG    Sample Expiration      07/07/2021,2359 Performed at [facility], [place] [address], [place], [place] [address]    Unit Number [id number]    Blood Component Type RED CELLS,LR    Unit division 00    Status of Unit ALLOCATED    Transfusion Status OK TO TRANSFUSE    Crossmatch Result Compatible    Unit Number [id number]    Blood Component Type RED CELLS,LR    Unit division 00     Status  of Unit ALLOCATED    Transfusion Status OK TO TRANSFUSE    Crossmatch Result Compatible    Unit Number [id number]    Blood Component Type RED CELLS,LR    Unit division 00    Status of Unit ALLOCATED    Transfusion Status OK TO TRANSFUSE    Crossmatch Result Compatible    Unit Number [id number]    Blood Component Type RED CELLS,LR    Unit division 00    Status of Unit ALLOCATED    Transfusion Status OK TO TRANSFUSE    Crossmatch Result Compatible   Prepare RBC (crossmatch)     Status: None   Collection Time: 07/04/21  6:27 PM  Result Value Ref Range   Order Confirmation      ORDER PROCESSED BY BLOOD BANK Performed at [facility], [address], [place], [place] [address]   Magnesium     Status: None   Collection Time: 07/05/21  3:42 AM  Result Value Ref Range   Magnesium 1.8 1.7 - 2.4 mg/dL    Comment: Performed at [facility] [address] [place], [place] [address]  Basic metabolic panel     Status: Abnormal   Collection Time: 07/05/21  3:42 AM  Result Value Ref Range   Sodium 135 135 - 145 mmol/L   Potassium 3.3 (L) 3.5 - 5.1 mmol/L   Chloride 102 98 - 111 mmol/L   CO2 24 22 - 32 mmol/L   Glucose, Bld 105 (H) 70 - 99 mg/dL    Comment: Glucose reference range applies only to samples taken after fasting for at least 8 hours.   BUN 10 6 - 20 mg/dL   Creatinine, Ser 0.82 0.61 - 1.24 mg/dL   Calcium 8.8 (L) 8.9 - 10.3 mg/dL   GFR, Estimated >60 >60 mL/min    Comment: (NOTE) Calculated using the CKD-EPI Creatinine Equation (2021)    Anion gap 9 5 - 15    Comment: Performed at [facility] [address], [place], [place] [address]  CBC     Status: Abnormal   Collection Time: 07/05/21  3:42 AM  Result Value Ref Range   WBC 11.5 (H) 4.0 - 10.5 K/uL   RBC 4.48 4.22 - 5.81 MIL/uL   Hemoglobin 11.8 (L) 13.0 - 17.0 g/dL   HCT 34.8 (L) 39.0 - 52.0 %   MCV 77.7 (L) 80.0 - 100.0 fL   MCH 26.3 26.0 - 34.0 pg   MCHC 33.9 30.0 - 36.0  g/dL   RDW 13.7 11.5 - 15.5 %   Platelets 307 150 - 400 K/uL   nRBC 0.0 0.0 - 0.2 %    Comment: Performed at [facility], [place] [address], [place], [place] [address]  I-STAT 7, (LYTES, BLD GAS, ICA, H+H)     Status: Abnormal   Collection Time: 07/05/21  8:12 AM  Result Value Ref Range   pH, Arterial 7.380 7.35 - 7.45   pCO2 arterial 45.5 32 - 48 mmHg   pO2, Arterial 193 (H) 83 - 108 mmHg   Bicarbonate 26.9 20.0 - 28.0 mmol/L   TCO2 28 22 - 32 mmol/L   O2 Saturation 100 %   Acid-Base Excess 1.0 0.0 - 2.0 mmol/L   Sodium 138 135 - 145 mmol/L   Potassium 3.3 (L) 3.5 - 5.1 mmol/L   Calcium, Ion 1.25 1.15 - 1.40 mmol/L   HCT 33.0 (L) 39.0 - 52.0 %   Hemoglobin 11.2 (  L) 13.0 - 17.0 g/dL   Sample type ARTERIAL   I-STAT, chem 8     Status: Abnormal   Collection Time: 07/05/21  8:16 AM  Result Value Ref Range   Sodium 138 135 - 145 mmol/L   Potassium 3.2 (L) 3.5 - 5.1 mmol/L   Chloride 100 98 - 111 mmol/L   BUN 9 6 - 20 mg/dL   Creatinine, Ser 0.60 (L) 0.61 - 1.24 mg/dL   Glucose, Bld 109 (H) 70 - 99 mg/dL    Comment: Glucose reference range applies only to samples taken after fasting for at least 8 hours.   Calcium, Ion 1.24 1.15 - 1.40 mmol/L   TCO2 26 22 - 32 mmol/L   Hemoglobin 12.6 (L) 13.0 - 17.0 g/dL   HCT 37.0 (L) 39.0 - 52.0 %  I-STAT, chem 8     Status: Abnormal   Collection Time: 07/05/21  9:10 AM  Result Value Ref Range   Sodium 137 135 - 145 mmol/L   Potassium 3.5 3.5 - 5.1 mmol/L   Chloride 101 98 - 111 mmol/L   BUN 10 6 - 20 mg/dL   Creatinine, Ser 0.60 (L) 0.61 - 1.24 mg/dL   Glucose, Bld 114 (H) 70 - 99 mg/dL    Comment: Glucose reference range applies only to samples taken after fasting for at least 8 hours.   Calcium, Ion 1.20 1.15 - 1.40 mmol/L   TCO2 26 22 - 32 mmol/L   Hemoglobin 10.5 (L) 13.0 - 17.0 g/dL   HCT 31.0 (L) 39.0 - 52.0 %  I-STAT 7, (LYTES, BLD GAS, ICA, H+H)     Status: Abnormal   Collection Time: 07/05/21  9:29 AM  Result Value Ref  Range   pH, Arterial 7.437 7.35 - 7.45   pCO2 arterial 42.6 32 - 48 mmHg   pO2, Arterial 338 (H) 83 - 108 mmHg   Bicarbonate 28.7 (H) 20.0 - 28.0 mmol/L   TCO2 30 22 - 32 mmol/L   O2 Saturation 100 %   Acid-Base Excess 4.0 (H) 0.0 - 2.0 mmol/L   Sodium 138 135 - 145 mmol/L   Potassium 4.1 3.5 - 5.1 mmol/L   Calcium, Ion 1.08 (L) 1.15 - 1.40 mmol/L   HCT 27.0 (L) 39.0 - 52.0 %   Hemoglobin 9.2 (L) 13.0 - 17.0 g/dL   Sample type ARTERIAL   POCT I-Stat EG7     Status: Abnormal   Collection Time: 07/05/21  9:33 AM  Result Value Ref Range   pH, Ven 7.298 7.25 - 7.43   pCO2, Ven 46.2 44 - 60 mmHg   pO2, Ven 38 32 - 45 mmHg   Bicarbonate 22.6 20.0 - 28.0 mmol/L   TCO2 24 22 - 32 mmol/L   O2 Saturation 66 %   Acid-base deficit 4.0 (H) 0.0 - 2.0 mmol/L   Sodium 144 135 - 145 mmol/L   Potassium 3.8 3.5 - 5.1 mmol/L   Calcium, Ion 1.12 (L) 1.15 - 1.40 mmol/L   HCT 28.0 (L) 39.0 - 52.0 %   Hemoglobin 9.5 (L) 13.0 - 17.0 g/dL   Sample type VENOUS   I-STAT, chem 8     Status: Abnormal   Collection Time: 07/05/21  9:55 AM  Result Value Ref Range   Sodium 136 135 - 145 mmol/L   Potassium 4.7 3.5 - 5.1 mmol/L   Chloride 102 98 - 111 mmol/L   BUN 11 6 - 20 mg/dL   Creatinine, Ser 0.60 (L)  0.61 - 1.24 mg/dL   Glucose, Bld 143 (H) 70 - 99 mg/dL    Comment: Glucose reference range applies only to samples taken after fasting for at least 8 hours.   Calcium, Ion 1.15 1.15 - 1.40 mmol/L   TCO2 26 22 - 32 mmol/L   Hemoglobin 9.2 (L) 13.0 - 17.0 g/dL   HCT 27.0 (L) 39.0 - 52.0 %  Anaerobic culture w Gram Stain     Status: None (Preliminary result)   Collection Time: 07/05/21 10:08 AM   Specimen: PATH Cytology Misc. fluid; Body Fluid  Result Value Ref Range   Specimen Description FLUID    Special Requests NONE    Gram Stain      NO WBC SEEN NO ORGANISMS SEEN Performed at [facility], [address], [place], [place] [address]    Culture PENDING    Report Status PENDING   Body  fluid culture w Gram Stain     Status: None (Preliminary result)   Collection Time: 07/05/21 10:08 AM   Specimen: PATH Cytology Misc. fluid; Body Fluid  Result Value Ref Range   Specimen Description FLUID    Special Requests NONE    Gram Stain      RARE WBC PRESENT, PREDOMINANTLY MONONUCLEAR NO ORGANISMS SEEN    Culture      NO GROWTH < 24 HOURS Performed at [facility] [address], [place], [place] [address]    Report Status PENDING   I-STAT, chem 8     Status: Abnormal   Collection Time: 07/05/21 10:56 AM  Result Value Ref Range   Sodium 136 135 - 145 mmol/L   Potassium 4.2 3.5 - 5.1 mmol/L   Chloride 102 98 - 111 mmol/L   BUN 11 6 - 20 mg/dL   Creatinine, Ser 0.60 (L) 0.61 - 1.24 mg/dL   Glucose, Bld 155 (H) 70 - 99 mg/dL    Comment: Glucose reference range applies only to samples taken after fasting for at least 8 hours.   Calcium, Ion 1.17 1.15 - 1.40 mmol/L   TCO2 26 22 - 32 mmol/L   Hemoglobin 9.5 (L) 13.0 - 17.0 g/dL   HCT 28.0 (L) 39.0 - 52.0 %  I-STAT, chem 8     Status: Abnormal   Collection Time: 07/05/21 11:59 AM  Result Value Ref Range   Sodium 137 135 - 145 mmol/L   Potassium 4.0 3.5 - 5.1 mmol/L   Chloride 102 98 - 111 mmol/L   BUN 11 6 - 20 mg/dL   Creatinine, Ser 0.60 (L) 0.61 - 1.24 mg/dL   Glucose, Bld 165 (H) 70 - 99 mg/dL    Comment: Glucose reference range applies only to samples taken after fasting for at least 8 hours.   Calcium, Ion 1.20 1.15 - 1.40 mmol/L   TCO2 27 22 - 32 mmol/L   Hemoglobin 8.8 (L) 13.0 - 17.0 g/dL   HCT 26.0 (L) 39.0 - 52.0 %  I-STAT, chem 8     Status: Abnormal   Collection Time: 07/05/21 12:55 PM  Result Value Ref Range   Sodium 136 135 - 145 mmol/L   Potassium 3.8 3.5 - 5.1 mmol/L   Chloride 103 98 - 111 mmol/L   BUN 12 6 - 20 mg/dL   Creatinine, Ser 0.60 (L) 0.61 - 1.24 mg/dL   Glucose, Bld 153 (H) 70 - 99 mg/dL    Comment: Glucose reference range applies only to samples taken after fasting for  at least  8 hours.   Calcium, Ion 1.17 1.15 - 1.40 mmol/L   TCO2 26 22 - 32 mmol/L   Hemoglobin 8.8 (L) 13.0 - 17.0 g/dL   HCT 26.0 (L) 39.0 - 52.0 %  Aerobic/Anaerobic Culture w Gram Stain (surgical/deep wound)     Status: None (Preliminary result)   Collection Time: 07/05/21  1:30 PM   Specimen: Aortic Valve Leaflets; Tissue  Result Value Ref Range   Specimen Description TISSUE    Special Requests      AORTIC VALVE LEAFLETS  S/O FOR MICRO RNA ANALYSIS TO [facility]   Gram Stain      NO SQUAMOUS EPITHELIAL CELLS SEEN RARE WBC SEEN NO ORGANISMS SEEN    Culture      NO GROWTH < 24 HOURS Performed at [facility], [place] [address], [place], [place] [address]    Report Status PENDING   Prepare platelet pheresis     Status: None   Collection Time: 07/05/21  1:35 PM  Result Value Ref Range   Unit Number [id number]    Blood Component Type PLTP1 PSORALEN TREATED    Unit division 00    Status of Unit ISSUED,FINAL    Transfusion Status      OK TO TRANSFUSE Performed at [facility] [address], [place], [place] [address]   Prepare fresh frozen plasma     Status: None   Collection Time: 07/05/21  1:35 PM  Result Value Ref Range   Unit Number [id number]    Blood Component Type THAWED PLASMA    Unit division 00    Status of Unit ISSUED,FINAL    Transfusion Status OK TO TRANSFUSE    Unit Number [id number]    Blood Component Type THAWED PLASMA    Unit division 00    Status of Unit ISSUED,FINAL    Transfusion Status      OK TO TRANSFUSE Performed at [facility], [place] [address], [place], [place] [address]   I-STAT, [place] Clap 8     Status: Abnormal   Collection Time: 07/05/21  1:44 PM  Result Value Ref Range   Sodium 137 135 - 145 mmol/L   Potassium 4.7 3.5 - 5.1 mmol/L   Chloride 104 98 - 111 mmol/L   BUN 12 6 - 20 mg/dL   Creatinine, Ser 0.70 0.61 - 1.24 mg/dL   Glucose, Bld 135 (H) 70 - 99 mg/dL    Comment: Glucose reference range applies only to samples taken after  fasting for at least 8 hours.   Calcium, Ion 1.15 1.15 - 1.40 mmol/L   TCO2 25 22 - 32 mmol/L   Hemoglobin 8.8 (L) 13.0 - 17.0 g/dL   HCT 26.0 (L) 39.0 - 52.0 %  Hemoglobin and hematocrit, blood     Status: Abnormal   Collection Time: 07/05/21  1:56 PM  Result Value Ref Range   Hemoglobin 9.1 (L) 13.0 - 17.0 g/dL    Comment: RESULT CALLED TO, READ BACK BY AND VERIFIED WITH:  Marcha Solders, RN AT [phone] 07/05/21 BY D. LONG    HCT 26.8 (L) 39.0 - 52.0 %    Comment: Performed at [facility], [place] [address], [place], [place] [address]  Platelet count     Status: None   Collection Time: 07/05/21  1:56 PM  Result Value Ref Range   Platelets 223 150 - 400 K/uL    Comment: Performed at [facility], [place]  [address], [place], [place] [address]  Fibrinogen     Status: None   Collection Time: 07/05/21  1:56 PM  Result Value Ref Range   Fibrinogen 443 210 - 475 mg/dL    Comment: (NOTE) Fibrinogen results may be underestimated in patients receiving thrombolytic therapy. Performed at [facility], [place] [address], [place], [place] [address]   I-STAT 7, (LYTES, BLD GAS, ICA, H+H)     Status: Abnormal   Collection Time: 07/05/21  3:18 PM  Result Value Ref Range   pH, Arterial 7.364 7.35 - 7.45   pCO2 arterial 44.7 32 - 48 mmHg   pO2, Arterial 352 (H) 83 - 108 mmHg   Bicarbonate 25.5 20.0 - 28.0 mmol/L   TCO2 27 22 - 32 mmol/L   O2 Saturation 100 %   Acid-Base Excess 0.0 0.0 - 2.0 mmol/L   Sodium 139 135 - 145 mmol/L   Potassium 3.9 3.5 - 5.1 mmol/L   Calcium, Ion 1.27 1.15 - 1.40 mmol/L   HCT 26.0 (L) 39.0 - 52.0 %   Hemoglobin 8.8 (L) 13.0 - 17.0 g/dL   Sample type ARTERIAL   I-STAT, chem 8     Status: Abnormal   Collection Time: 07/05/21  3:22 PM  Result Value Ref Range   Sodium 139 135 - 145 mmol/L   Potassium 3.9 3.5 - 5.1 mmol/L   Chloride 105 98 - 111 mmol/L   BUN 12 6 - 20 mg/dL   Creatinine, Ser 0.70 0.61 - 1.24 mg/dL   Glucose, Bld 125 (H) 70 - 99 mg/dL     Comment: Glucose reference range applies only to samples taken after fasting for at least 8 hours.   Calcium, Ion 1.28 1.15 - 1.40 mmol/L   TCO2 25 22 - 32 mmol/L   Hemoglobin 9.2 (L) 13.0 - 17.0 g/dL   HCT 27.0 (L) 39.0 - 52.0 %  CBC     Status: Abnormal   Collection Time: 07/05/21  4:40 PM  Result Value Ref Range   WBC 11.5 (H) 4.0 - 10.5 K/uL   RBC 3.42 (L) 4.22 - 5.81 MIL/uL   Hemoglobin 9.0 (L) 13.0 - 17.0 g/dL   HCT 27.2 (L) 39.0 - 52.0 %   MCV 79.5 (L) 80.0 - 100.0 fL   MCH 26.3 26.0 - 34.0 pg   MCHC 33.1 30.0 - 36.0 g/dL   RDW 13.9 11.5 - 15.5 %   Platelets 163 150 - 400 K/uL   nRBC 0.0 0.0 - 0.2 %    Comment: Performed at [facility], [place] [address], [place], [place] [address]  Protime-INR     Status: Abnormal   Collection Time: 07/05/21  4:40 PM  Result Value Ref Range   Prothrombin Time 19.2 (H) 11.4 - 15.2 seconds   INR 1.6 (H) 0.8 - 1.2    Comment: (NOTE) INR goal varies based on device and disease states. Performed at [facility], [place] [address] [place], [place] [address]   APTT     Status: None   Collection Time: 07/05/21  4:40 PM  Result Value Ref Range   aPTT 31 24 - 36 seconds    Comment: Performed at [facility] [address] [place], [place] [address]  Glucose, capillary     Status: Abnormal   Collection Time: 07/05/21  4:45 PM  Result Value Ref Range   Glucose-Capillary 116 (H) 70 - 99 mg/dL    Comment: Glucose reference range  applies only to samples taken after fasting for at least 8 hours.  I-STAT 7, (LYTES, BLD GAS, ICA, H+H)     Status: Abnormal   Collection Time: 07/05/21  4:47 PM  Result Value Ref Range   pH, Arterial 7.361 7.35 - 7.45   pCO2 arterial 43.5 32 - 48 mmHg   pO2, Arterial 91 83 - 108 mmHg   Bicarbonate 24.8 20.0 - 28.0 mmol/L   TCO2 26 22 - 32 mmol/L   O2 Saturation 97 %   Acid-base deficit 1.0 0.0 - 2.0 mmol/L   Sodium 139 135 - 145 mmol/L   Potassium 4.4 3.5 - 5.1 mmol/L   Calcium, Ion 1.27 1.15  - 1.40 mmol/L   HCT 26.0 (L) 39.0 - 52.0 %   Hemoglobin 8.8 (L) 13.0 - 17.0 g/dL   Patient temperature 36.4 C    Sample type ARTERIAL   Prepare fresh frozen plasma     Status: None   Collection Time: 07/05/21  5:48 PM  Result Value Ref Range   Unit Number [id number]    Blood Component Type THAWED PLASMA    Unit division 00    Status of Unit ISSUED,FINAL    Transfusion Status      OK TO TRANSFUSE Performed at [facility] [address] [place], [place] [address]   Glucose, capillary     Status: Abnormal   Collection Time: 07/05/21  6:44 PM  Result Value Ref Range   Glucose-Capillary 146 (H) 70 - 99 mg/dL    Comment: Glucose reference range applies only to samples taken after fasting for at least 8 hours.  Glucose, capillary     Status: Abnormal   Collection Time: 07/05/21  7:39 PM  Result Value Ref Range   Glucose-Capillary 157 (H) 70 - 99 mg/dL    Comment: Glucose reference range applies only to samples taken after fasting for at least 8 hours.  Glucose, capillary     Status: Abnormal   Collection Time: 07/05/21  8:49 PM  Result Value Ref Range   Glucose-Capillary 155 (H) 70 - 99 mg/dL    Comment: Glucose reference range applies only to samples taken after fasting for at least 8 hours.  CBC     Status: Abnormal   Collection Time: 07/05/21  9:00 PM  Result Value Ref Range   WBC 11.3 (H) 4.0 - 10.5 K/uL   RBC 3.33 (L) 4.22 - 5.81 MIL/uL   Hemoglobin 8.6 (L) 13.0 - 17.0 g/dL   HCT 26.8 (L) 39.0 - 52.0 %   MCV 80.5 80.0 - 100.0 fL   MCH 25.8 (L) 26.0 - 34.0 pg   MCHC 32.1 30.0 - 36.0 g/dL   RDW 14.1 11.5 - 15.5 %   Platelets 184 150 - 400 K/uL   nRBC 0.0 0.0 - 0.2 %    Comment: Performed at [facility], [place] [address], [place], [place] [address]  Basic metabolic panel     Status: Abnormal   Collection Time: 07/05/21  9:00 PM  Result Value Ref Range   Sodium 136 135 - 145 mmol/L   Potassium 4.7 3.5 - 5.1 mmol/L   Chloride 105 98 - 111 mmol/L   CO2 23  22 - 32 mmol/L   Glucose, Bld 161 (H) 70 - 99 mg/dL    Comment: Glucose reference range applies only to samples taken after fasting for at least 8 hours.   BUN 12 6 - 20 mg/dL   Creatinine, Ser  0.72 0.61 - 1.24 mg/dL   Calcium 8.0 (L) 8.9 - 10.3 mg/dL   GFR, Estimated >60 >60 mL/min    Comment: (NOTE) Calculated using the CKD-EPI Creatinine Equation (2021)    Anion gap 8 5 - 15    Comment: Performed at [facility] [address], [place], [place] [address]  Magnesium     Status: Abnormal   Collection Time: 07/05/21  9:00 PM  Result Value Ref Range   Magnesium 2.9 (H) 1.7 - 2.4 mg/dL    Comment: Performed at [facility] [address] [place], [place] [address]  Glucose, capillary     Status: Abnormal   Collection Time: 07/05/21  9:58 PM  Result Value Ref Range   Glucose-Capillary 142 (H) 70 - 99 mg/dL    Comment: Glucose reference range applies only to samples taken after fasting for at least 8 hours.  I-STAT 7, (LYTES, BLD GAS, ICA, H+H)     Status: Abnormal   Collection Time: 07/05/21  9:59 PM  Result Value Ref Range   pH, Arterial 7.365 7.35 - 7.45   pCO2 arterial 42.9 32 - 48 mmHg   pO2, Arterial 197 (H) 83 - 108 mmHg   Bicarbonate 24.7 20.0 - 28.0 mmol/L   TCO2 26 22 - 32 mmol/L   O2 Saturation 100 %   Acid-base deficit 1.0 0.0 - 2.0 mmol/L   Sodium 139 135 - 145 mmol/L   Potassium 4.6 3.5 - 5.1 mmol/L   Calcium, Ion 1.22 1.15 - 1.40 mmol/L   HCT 24.0 (L) 39.0 - 52.0 %   Hemoglobin 8.2 (L) 13.0 - 17.0 g/dL   Patient temperature 36.2 C    Collection site art line    Drawn by HIDE    Sample type ARTERIAL   Glucose, capillary     Status: Abnormal   Collection Time: 07/05/21 11:08 PM  Result Value Ref Range   Glucose-Capillary 138 (H) 70 - 99 mg/dL    Comment: Glucose reference range applies only to samples taken after fasting for at least 8 hours.  Glucose, capillary     Status: Abnormal   Collection Time: 07/06/21  1:20 AM  Result Value Ref Range    Glucose-Capillary 130 (H) 70 - 99 mg/dL    Comment: Glucose reference range applies only to samples taken after fasting for at least 8 hours.  Glucose, capillary     Status: Abnormal   Collection Time: 07/06/21  2:22 AM  Result Value Ref Range   Glucose-Capillary 144 (H) 70 - 99 mg/dL    Comment: Glucose reference range applies only to samples taken after fasting for at least 8 hours.  Glucose, capillary     Status: Abnormal   Collection Time: 07/06/21  3:24 AM  Result Value Ref Range   Glucose-Capillary 141 (H) 70 - 99 mg/dL    Comment: Glucose reference range applies only to samples taken after fasting for at least 8 hours.  I-STAT 7, (LYTES, BLD GAS, ICA, H+H)     Status: Abnormal   Collection Time: 07/06/21  3:26 AM  Result Value Ref Range   pH, Arterial 7.361 7.35 - 7.45   pCO2 arterial 44.7 32 - 48 mmHg   pO2, Arterial 182 (H) 83 - 108 mmHg   Bicarbonate 25.3 20.0 - 28.0 mmol/L   TCO2 27 22 - 32 mmol/L   O2 Saturation 100 %   Acid-Base Excess 0.0 0.0 - 2.0 mmol/L   Sodium 139 135 -  145 mmol/L   Potassium 4.7 3.5 - 5.1 mmol/L   Calcium, Ion 1.26 1.15 - 1.40 mmol/L   HCT 25.0 (L) 39.0 - 52.0 %   Hemoglobin 8.5 (L) 13.0 - 17.0 g/dL   Patient temperature 37.2 C    Collection site art line    Drawn by HIDE    Sample type ARTERIAL   CBC     Status: Abnormal   Collection Time: 07/06/21  3:30 AM  Result Value Ref Range   WBC 8.4 4.0 - 10.5 K/uL   RBC 3.31 (L) 4.22 - 5.81 MIL/uL   Hemoglobin 8.8 (L) 13.0 - 17.0 g/dL   HCT 26.5 (L) 39.0 - 52.0 %   MCV 80.1 80.0 - 100.0 fL   MCH 26.6 26.0 - 34.0 pg   MCHC 33.2 30.0 - 36.0 g/dL   RDW 14.1 11.5 - 15.5 %   Platelets 181 150 - 400 K/uL   nRBC 0.0 0.0 - 0.2 %    Comment: Performed at [facility], [place] [address], [place], [place] [address]  Basic metabolic panel     Status: Abnormal   Collection Time: 07/06/21  3:30 AM  Result Value Ref Range   Sodium 137 135 - 145 mmol/L   Potassium 4.7 3.5 - 5.1 mmol/L   Chloride 106  98 - 111 mmol/L   CO2 24 22 - 32 mmol/L   Glucose, Bld 143 (H) 70 - 99 mg/dL    Comment: Glucose reference range applies only to samples taken after fasting for at least 8 hours.   BUN 12 6 - 20 mg/dL   Creatinine, Ser 0.79 0.61 - 1.24 mg/dL   Calcium 8.4 (L) 8.9 - 10.3 mg/dL   GFR, Estimated >60 >60 mL/min    Comment: (NOTE) Calculated using the CKD-EPI Creatinine Equation (2021)    Anion gap 7 5 - 15    Comment: Performed at [facility] [address] [place], [place] [address]  Magnesium     Status: Abnormal   Collection Time: 07/06/21  3:30 AM  Result Value Ref Range   Magnesium 2.7 (H) 1.7 - 2.4 mg/dL    Comment: Performed at [facility] [address], [place], [place] [address]  Glucose, capillary     Status: Abnormal   Collection Time: 07/06/21  4:54 AM  Result Value Ref Range   Glucose-Capillary 143 (H) 70 - 99 mg/dL    Comment: Glucose reference range applies only to samples taken after fasting for at least 8 hours.  Glucose, capillary     Status: Abnormal   Collection Time: 07/06/21  6:26 AM  Result Value Ref Range   Glucose-Capillary 125 (H) 70 - 99 mg/dL    Comment: Glucose reference range applies only to samples taken after fasting for at least 8 hours.  Glucose, capillary     Status: Abnormal   Collection Time: 07/06/21  7:35 AM  Result Value Ref Range   Glucose-Capillary 142 (H) 70 - 99 mg/dL    Comment: Glucose reference range applies only to samples taken after fasting for at least 8 hours.  Glucose, capillary     Status: Abnormal   Collection Time: 07/06/21  8:48 AM  Result Value Ref Range   Glucose-Capillary 136 (H) 70 - 99 mg/dL    Comment: Glucose reference range applies only to samples taken after fasting for at least 8 hours.  I-STAT 7, (LYTES, BLD GAS, ICA, H+H)     Status:  Abnormal   Collection Time: 07/06/21 10:12 AM  Result Value Ref Range   pH, Arterial 7.313 (L) 7.35 - 7.45   pCO2 arterial 51.9 (H) 32 - 48 mmHg   pO2, Arterial  107 83 - 108 mmHg   Bicarbonate 26.4 20.0 - 28.0 mmol/L   TCO2 28 22 - 32 mmol/L   O2 Saturation 98 %   Acid-Base Excess 0.0 0.0 - 2.0 mmol/L   Sodium 139 135 - 145 mmol/L   Potassium 4.6 3.5 - 5.1 mmol/L   Calcium, Ion 1.29 1.15 - 1.40 mmol/L   HCT 25.0 (L) 39.0 - 52.0 %   Hemoglobin 8.5 (L) 13.0 - 17.0 g/dL   Patient temperature 36.8 C    Sample type ARTERIAL   Glucose, capillary     Status: Abnormal   Collection Time: 07/06/21 10:37 AM  Result Value Ref Range   Glucose-Capillary 131 (H) 70 - 99 mg/dL    Comment: Glucose reference range applies only to samples taken after fasting for at least 8 hours.  Glucose, capillary     Status: Abnormal   Collection Time: 07/06/21 12:00 PM  Result Value Ref Range   Glucose-Capillary 123 (H) 70 - 99 mg/dL    Comment: Glucose reference range applies only to samples taken after fasting for at least 8 hours.    MICRO:  IMAGING: DG Chest 2 View  Result Date: 07/04/2021 CLINICAL DATA:  Preop evaluation for cardiac surgery EXAM: CHEST - 2 VIEW COMPARISON:  06/24/2021 FINDINGS: Transverse diameter of heart is increased. There are no signs of alveolar pulmonary edema. There are no new focal infiltrates. There is no pleural effusion or pneumothorax. IMPRESSION: Cardiomegaly. There are no signs of pulmonary edema or focal pulmonary consolidation. Electronically Signed   By: Elmer Picker M.D.   On: 07/04/2021 19:23   DG Chest Port 1 View  Result Date: 07/06/2021 CLINICAL DATA:  Chest pain.  Follow-up study. EXAM: PORTABLE CHEST 1 VIEW COMPARISON:  07/05/2021 and older studies. FINDINGS: Stable changes from the recent median sternotomy. Cardiac silhouette normal in size and configuration. No mediastinal widening. Lungs clear.  No convincing pleural effusion and no pneumothorax. Endotracheal tube tip now projects 2.6 cm above the carina. Nasal/orogastric tube and right internal jugular Swan-Ganz catheter are stable. IMPRESSION: 1. No acute findings  or evidence of an operative complication. 2. Well-positioned support apparatus. Endotracheal tube tip now projects 2.6 cm above the carina. Electronically Signed   By: Lajean Manes M.D.   On: 07/06/2021 08:30   DG Chest Portable 1 View  Result Date: 07/05/2021 CLINICAL DATA:  31 year old male with status post aortic root replacement, assess for pneumothorax. EXAM: PORTABLE CHEST - 1 VIEW COMPARISON:  07/04/2021 FINDINGS: The mediastinal contours are within normal limits. Unchanged cardiomegaly. Right internal jugular central line in place with pulmonary artery catheter, tip projecting over the main pulmonary artery. Endotracheal tube in place in the distal thoracic trachea, tip approximately 1 cm above the carina. Gastric decompression tube in place with the distal tip just beyond the gastroesophageal junction. Low lung volumes. No focal consolidations, pleural effusion, or evidence of pneumothorax. Median sternotomy wires in place. IMPRESSION: 1. No evidence of pneumothorax. 2. Endotracheal tube tip positioned approximately 1 cm above the carina. Recommend retraction by 2-3 cm. 3. Gastric decompression tube tip barely within the stomach. Recommend advancement by approximately 5-10 cm for optimum function. 4. Stable cardiomegaly after aortic root placement. Electronically Signed   By: Ruthann Cancer M.D.   On: 07/05/2021 16:34  ECHO INTRAOPERATIVE TEE  Result Date: 07/05/2021  *INTRAOPERATIVE TRANSESOPHAGEAL REPORT *  Patient Name:   Martin Stafford Date of Exam: 07/05/2021 Medical Rec #:  [id number]         Height:       71.0 in Accession #:    [id number]        Weight:       240.0 lb Date of Birth:  April 25, 1991         BSA:          2.28 m Patient Age:    30 years          BP:           136/63 mmHg Patient Gender: M                 HR:           99 bpm. Exam Location:  Anesthesiology Transesophogeal exam was perform intraoperatively during surgical procedure. Patient was closely monitored under general  anesthesia during the entirety of examination. Indications:     I35.1 Nonrheumatic aortic (valve) insufficiency Sonographer:     Raquel Sarna Senior RDCS Performing Phys: Suella Broad MD Diagnosing Phys: Suella Broad MD Complications: No known complications during this procedure. POST-OP IMPRESSIONS _ Left Ventricle: LVEF unchanged, EF > 60%. CO > 5 L/min, CI > 2.5 L/min/m. No RWMA's. _ Right Ventricle: The right ventricle appears unchanged from pre-bypass. _ Aorta: Homograft aortic root present, no dissection noted after cannula removed. _ Left Atrium: The left atrium appears unchanged from pre-bypass. _ Left Atrial Appendage: The left atrial appendage appears unchanged from pre-bypass. _ Aortic Valve: 59m Aortic Valve placed, mild to moderate AI with eccentric jet towards the septum of the LV. Sutures noted at the annulus of the aortic valve. No stenosis noted (mean gradient 8 mmHg) _ Mitral Valve: The mitral valve appears unchanged from pre-bypass. _ Tricuspid Valve: The tricuspid valve appears unchanged from pre-bypass. _ Pulmonic Valve: The pulmonic valve appears unchanged from pre-bypass. _ Interatrial Septum: The interatrial septum appears unchanged from pre-bypass. _ Interventricular Septum: The interventricular septum appears unchanged from pre-bypass. No VSD noted. _ Pericardium: The pericardium appears unchanged from pre-bypass. PRE-OP FINDINGS  Left Ventricle: The left ventricle has normal systolic function, with an ejection fraction of 60-65%. The cavity size was normal. No evidence of left ventricular regional wall motion abnormalities. There is no left ventricular hypertrophy. Left ventricular diastolic function could not be evaluated. Right Ventricle: The right ventricle has normal systolic function. The cavity was normal. There is no increase in right ventricular wall thickness. There is no aneurysm seen. PA catheter traversing the RV into the main pulmonary artery. Left Atrium: Left atrial size was  normal in size. No left atrial/left atrial appendage thrombus was detected. The left atrial appendage is well visualized and there is no evidence of thrombus present. Left atrial appendage velocity is normal at greater than 40 cm/s. Right Atrium: Right atrial size was normal in size. PA catheter traversing the RA into the RV. Interatrial Septum: No atrial level shunt detected by color flow Doppler. There is no evidence of a patent foramen ovale. Pericardium: Trivial pericardial effusion is present. The pericardial effusion is localized near the right ventricle. There is no pleural effusion. Mitral Valve: The mitral valve is normal in structure. Mitral valve regurgitation is not visualized by color flow Doppler. There is no evidence of mitral valve vegetation. There is No evidence of mitral stenosis. Tricuspid Valve: The tricuspid valve was normal  in structure. Tricuspid valve regurgitation is trivial by color flow Doppler. No evidence of tricuspid stenosis is present. There is no evidence of tricuspid valve vegetation. Aortic Valve: The aortic valve is tricuspid with large, abnormal non-coronary cusp. The non-coronary cusp appears to be perforated with significant flow through the defect during diastole. Aortic valve regurgitation is severe by color flow Doppler (annular size of 33 mm). The jet is eccentric and posteriorly directed. There is no stenosis of the aortic valve (mean gradient 8 mmHg). The left and right coronary leaflets appear normal with minimal calcification. Pulmonic Valve: The pulmonic valve was normal in structure, with normal. Pulmonic valve regurgitation is not visualized by color flow Doppler. Aorta: The ascending aorta and aortic arch are normal in size and structure. There is severe dilatation of the aortic root, measuring 39 mm at the sinus. Pulmonary Artery: The pulmonary artery is of normal size. PA catheter present in the main pulmonary artery. Shunts: There is no evidence of an atrial  septal defect. No PFO, no ASD. No VSD.  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 Restart abtx clock using today as day 1 for 6 wk of abtx. Will check on Monday if path is going to send tissue to [facility] for 16S sequencing - usually takes 10 business days for results.--at that time may or may not be able to identify pathogen which may change abtx regimen. - for now continue on current course - defer to primary team for extubation, pulling of chest tube, pacer wires and exchange of lines as he steadily improves.  Elzie Rings [facility] [phone]

## 2021-07-06 NOTE — Op Note (Signed)
NAME: Martin Stafford, Martin Stafford MEDICAL RECORD NO: [id number] ACCOUNT NO: [id number] DATE OF BIRTH: 05/16/1990 FACILITY: [facility] LOCATION: [facility] PHYSICIAN: Kerin Perna III, MD  Operative Report   DATE OF PROCEDURE: 07/05/2021   OPERATION:  1.  Homograft aortic root replacement using a 25 mm cryopreserved aortic root [employer] #[id number]. 2.  Debridement and closure of subannular abscess in the left ventricular endocardium of the ventricular septum.  SURGEON:  Dr. Kathlee Nations Trigt.  ASSISTANT: Jillyn Hidden, PA-C and Gershon Crane PA-C.    A surgical first assistant was needed for the complexity of this operation, and to provide surgical standard of care.  The first assistant was needed for exposure, suctioning, dissection and to help with the implantation of the aortic root homograft and  completion of the numerous anastomoses.  ANESTHESIA:  General.    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  CLINICAL NOTE:  The patient is a 31 year old male who has had subacute illnesses for the past several days and was seen on multiple occasions in the EGD before diagnosis was made based on an abnormal cardiac murmur.  Echocardiogram showed the evidence of  severe aortic insufficiency with vegetations and he was admitted to the cardiology service.  He was started on empiric IV antibiotics after blood cultures, which were drawn and were negative.  The patient had no other valvular involvement from the  endocarditis and had preserved LV systolic function.  A comprehensive evaluation of the patient was made to identify the origin of the infection and bacteremia; however, the only suspicious site was an indolent infection of a previous pilonidal cyst  excision with a chronically draining sinus, which was approximately 4 cm deep.  The patient had a  cardiac CT scan, which showed no significant coronary artery disease in addition to confirming the endocarditis and subannular abscess.  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  I discussed the indications and expected benefits of homograft aortic root replacement with debridement and closure of the  subannular abscess with the patient and family.  We discussed the details of surgery including the use of general anesthesia and cardiopulmonary bypass, the location of the surgical incisions, and the expected postoperative recovery.  We discussed the  risks to him of stroke, recurrent infection, bleeding, blood transfusion requirement, organ failure, arrhythmia, and death.  He demonstrated his understanding, understood that this was a high risk operation with severe cardiac disease, and agreed to  proceed with surgery under what I felt was an informed consent.  OPERATIVE FINDINGS:  Severe generalized edema of the cardiac tissue including the aorta left ventricular and right ventricular epicardium as well as a 2.5 cm LV epicardial fascicular/blister like structure filled with cloudy fluid was drained and irrigated  Trileaflet aortic valve with edematous annulus, no significant vegetations of the leaflets, and subannular LV endocardial septum necrotic abscess which was debrided, irrigated, and covered with pericardial tissue  DESCRIPTION OF PROCEDURE:  The patient was brought to the operating room after informed consent was documented and final issues were addressed at the face-to-face encounter in the preoperative holding area.  The patient was placed supine on the operating  table and general anesthesia was induced.  The patient remained stable.  A transesophageal echo probe was placed by the anesthesia team.  The patient was positioned and then prepped and  draped as a sterile field.  A proper timeout was performed.  A   sternal incision was made.  The sternal retractor was placed and the pericardium was opened.  There was a moderate amount of pericardial effusion.  On the epicardium of the mid LAD, there was blister-like collection of purulent material, which was  opened, debrided and cultured.  There is generalized significant tissue edema of the heart, epicardium and the aortic adventitia.  Heparin was administered and pursestrings were placed in the ascending aorta and right atrium and the patient was  subsequently cannulated and placed on bypass.  A left ventricular vent was placed in the superior pulmonary vein.  Cardioplegia cannula was placed in the coronary sinus for retrograde cardioplegia and aortic root vent was placed as well.  The patient was  cooled to 32 degrees and the aortic crossclamp was applied.  1.5 L of cold blood KBC cardioplegia was delivered with good cardioplegic arrest and septal temperature dropped less than 14 degrees.  The protocol for delivered cardioplegia was followed  during the operation.  A transverse aortotomy was made.  The aortic valve was inspected.  The annulus in general was edematous.  There was some thickening of the noncoronary leaflet, but no classic vegetations.  There was a 2 mm soft tissue defect just beneath the annulus in the  coronary sinus of Valsalva and this probed to an area of soft chronically infected tissue in the intraventricular septum seen after the valve leaflets were excised and sent for microbiology and pathology.  The entire outflow tract was irrigated.  We then proceeded  to debride the abscess in the intraventricular septum beneath the annulus with the rongeurs and sharp excisional debridement.  This left a 2.5 cm oval defect in the endocardium.  This was closed with interrupted pledgeted sutures using pericardium from  the patient for the pledget material.  An additional pledgeted suture was placed beneath the annulus at the site of the pinhole opening  in the coronary sinus also with pledgeted sutures.   We then proceeded to prepare the aortic root for the homograft.  The aorta was resected down to the annular tissue.  The coronary buttons were developed and mobilized to the side.  The annulus was sized to a 25 mm valve and the 25 mm homograft conduit  was prepared according to protocol.  We then placed 33, 4-0 Ethibond sutures around the annulus.  The homograft was then brought to the field and trimmed of the septal wall muscle and the mitral valve anterior leaflet.  The 4-0 single Ethibond sutures were then  placed around the base of the homograft circumferentially.  The homograft was lowered to the annulus and all the sutures were tied.  There was good confirmation of the homograft to the annular tissue, especially over the area of the subannular abscess in the middle of  the noncoronary cusp.  We then attached the coronary buttons first to the left main and into the right coronary using running 5-0 Prolene.  The aortic portion of the homograft was then trimmed to the appropriate length and configuration and sewn end-to-end to the aorta, which had been completely  transected beneath the crossclamp.  This was performed with running 4-0 Prolene.  We then gave a dose of the reanimation dose of warm cardioplegia and after usual de-airing maneuvers, the crossclamp was removed.  The heart resumed a spontaneous rhythm.  The patient was rewarmed and reperfused, the surgical anastomosis seemed to be adequately hemostatic.  Temporary pacing wires were applied.  The lungs were reexpanded, ventilator was resumed.  The patient was then  weaned from cardiopulmonary bypass on low dose inotropic support.  Hemodynamics were stable.  There was no abnormal or untoward response of protamine.  The protamine was slowly administered and the patient remained stable.  The cannulas and cardioplegia  lines were removed.  The transesophageal echo showed good ventricular  function.  There was turbulence in the LV outflow tract underneath the valve at the side of the debridement and repair.  There is some evidence of regurgitation felt to be probably  central, but it was difficult for the echocardiographic images to definitely assess the doppler flow.   The patient had normal diastolic pressure and good hemodynamics and I felt that with healing of the debrided and closed abscess, these findings would not be hemodynamically  significant.  The patient was given FFP for improvement of hemostasis.  Anterior and posterior mediastinal tubes were placed and brought out through separate incisions.  The pericardium was closed over the aorta and homograft.  Sternum was closed with interrupted  steel wires.  The patient remained stable.  The pectoralis fascia and subcutaneous layers were closed with running Vicryl.  The skin was closed with a subcuticular suture and sterile dressings were applied.  Total cardiopulmonary bypass time was 220  minutes.  An x-ray was taken in the OR, which showed all lines in good position and no pneumothorax.  The patient returned to the ICU in critical but stable condition.     SUJ D: 07/05/2021 6:07:29 pm T: 07/06/2021 12:20:00 am  JOB: [id number]/ [id number]

## 2021-07-06 NOTE — Procedures (Signed)
Extubation Procedure Note  Patient Details:   Name: Martin Stafford DOB: Nov 02, 1990 MRN: [id number]   Airway Documentation:    Vent end date: 07/06/21 Vent end time: 1544   Evaluation  O2 sats: stable throughout Complications: No apparent complications Patient did tolerate procedure well. Bilateral Breath Sounds: Clear  Pt able to speak and cuff leak: Yes  Rudene Re 07/06/2021, 3:46 PM

## 2021-07-07 ENCOUNTER — Inpatient Hospital Stay: Payer: Self-pay

## 2021-07-07 ENCOUNTER — Inpatient Hospital Stay (HOSPITAL_COMMUNITY): Payer: BC Managed Care – PPO

## 2021-07-07 DIAGNOSIS — R0989 Other specified symptoms and signs involving the circulatory and respiratory systems: Secondary | ICD-10-CM | POA: Diagnosis not present

## 2021-07-07 DIAGNOSIS — Z952 Presence of prosthetic heart valve: Secondary | ICD-10-CM | POA: Diagnosis not present

## 2021-07-07 DIAGNOSIS — R918 Other nonspecific abnormal finding of lung field: Secondary | ICD-10-CM | POA: Diagnosis not present

## 2021-07-07 LAB — BASIC METABOLIC PANEL
Anion gap: 9 (ref 5–15)
Anion gap: 13 (ref 5–15)
BUN: 18 mg/dL (ref 6–20)
BUN: 15 mg/dL (ref 6–20)
CO2: 26 mmol/L (ref 22–32)
CO2: 29 mmol/L (ref 22–32)
Calcium: 8.5 mg/dL — ABNORMAL LOW (ref 8.9–10.3)
Calcium: 8.7 mg/dL — ABNORMAL LOW (ref 8.9–10.3)
Chloride: 104 mmol/L (ref 98–111)
Chloride: 94 mmol/L — ABNORMAL LOW (ref 98–111)
Creatinine, Ser: 0.74 mg/dL (ref 0.61–1.24)
Creatinine, Ser: 0.73 mg/dL (ref 0.61–1.24)
GFR, Estimated: >60 mL/min (ref >60)
GFR, Estimated: >60 mL/min (ref >60)
Glucose, Bld: 124 mg/dL — ABNORMAL HIGH (ref 70–99)
Glucose, Bld: 115 mg/dL — ABNORMAL HIGH (ref 70–99)
Potassium: 4 mmol/L (ref 3.5–5.1)
Potassium: 3.2 mmol/L — ABNORMAL LOW (ref 3.5–5.1)
Sodium: 139 mmol/L (ref 135–145)
Sodium: 136 mmol/L (ref 135–145)

## 2021-07-07 LAB — DRUG SCREEN 10 W/CONF, SERUM
Amphetamines, IA: NEGATIVE ng/mL
Barbiturates, IA: NEGATIVE ug/mL
Benzodiazepines, IA: NEGATIVE ng/mL
Cocaine & Metabolite, IA: NEGATIVE ng/mL
Methadone, IA: NEGATIVE ng/mL
Opiates, IA: NEGATIVE ng/mL
Oxycodones, IA: NEGATIVE ng/mL
Phencyclidine, IA: NEGATIVE ng/mL
Propoxyphene, IA: NEGATIVE ng/mL
THC(Marijuana) Metabolite, IA: NEGATIVE ng/mL

## 2021-07-07 LAB — CBC
HCT: 23.5 % — ABNORMAL LOW (ref 39.0–52.0)
Hemoglobin: 7.7 g/dL — ABNORMAL LOW (ref 13.0–17.0)
MCH: 26.5 pg (ref 26.0–34.0)
MCHC: 32.8 g/dL (ref 30.0–36.0)
MCV: 80.8 fL (ref 80.0–100.0)
Platelets: 193 10*3/uL (ref 150–400)
RBC: 2.91 MIL/uL — ABNORMAL LOW (ref 4.22–5.81)
RDW: 14.5 % (ref 11.5–15.5)
WBC: 10.1 10*3/uL (ref 4.0–10.5)
nRBC: 0 % (ref 0.0–0.2)

## 2021-07-07 LAB — COOXEMETRY PANEL
Carboxyhemoglobin: <0.3 % — ABNORMAL LOW (ref 0.5–1.5)
Methemoglobin: 3.2 % — ABNORMAL HIGH (ref 0.0–1.5)
O2 Saturation: 70.6 %
Total hemoglobin: 8.2 g/dL — ABNORMAL LOW (ref 12.0–16.0)

## 2021-07-07 LAB — BPAM RBC
Blood Product Expiration Date: 202303242359
Unit Type and Rh: 5100

## 2021-07-07 LAB — GLUCOSE, CAPILLARY
Glucose-Capillary: 117 mg/dL — ABNORMAL HIGH (ref 70–99)
Glucose-Capillary: 115 mg/dL — ABNORMAL HIGH (ref 70–99)
Glucose-Capillary: 109 mg/dL — ABNORMAL HIGH (ref 70–99)
Glucose-Capillary: 114 mg/dL — ABNORMAL HIGH (ref 70–99)

## 2021-07-07 LAB — PREPARE RBC (CROSSMATCH)

## 2021-07-07 MED ORDER — MORPHINE SULFATE (PF) 4 MG/ML IV SOLN
4.0000 mg | INTRAVENOUS | Status: DC | PRN
  Administered 2021-07-07 – 2021-07-08 (×2): 4 mg via INTRAVENOUS
  Filled 2021-07-07 (×2): qty 1

## 2021-07-07 MED ORDER — ASPIRIN 81 MG PO CHEW: 81.0000 mg | CHEWABLE_TABLET | Freq: Every day | ORAL | Status: DC

## 2021-07-07 MED ORDER — ASPIRIN EC 81 MG PO TBEC
81.0000 mg | DELAYED_RELEASE_TABLET | Freq: Every day | ORAL | Status: DC
Start: 2021-07-07 — End: 2021-07-12
  Administered 2021-07-07 – 2021-07-12 (×6): 81 mg via ORAL
  Filled 2021-07-07 (×6): qty 1

## 2021-07-07 MED ORDER — INSULIN ASPART 100 UNIT/ML IJ SOLN
0.0000 [IU] | Freq: Three times a day (TID) | INTRAMUSCULAR | Status: DC
  Administered 2021-07-08: 2 [IU] via SUBCUTANEOUS

## 2021-07-07 MED ORDER — FUROSEMIDE 10 MG/ML IJ SOLN
40.0000 mg | Freq: Two times a day (BID) | INTRAMUSCULAR | Status: DC
  Administered 2021-07-07 – 2021-07-08 (×3): 40 mg via INTRAVENOUS
  Filled 2021-07-07 (×3): qty 4

## 2021-07-07 MED ORDER — MIDAZOLAM HCL 2 MG/2ML IJ SOLN: 2.0000 mg | Freq: Four times a day (QID) | INTRAMUSCULAR | Status: DC | PRN

## 2021-07-07 MED ORDER — POTASSIUM CHLORIDE 10 MEQ/50ML IV SOLN
10.0000 meq | INTRAVENOUS | Status: AC
  Administered 2021-07-07 (×4): 10 meq via INTRAVENOUS
  Filled 2021-07-07 (×2): qty 50

## 2021-07-07 MED ORDER — METOLAZONE 5 MG PO TABS
10.0000 mg | ORAL_TABLET | Freq: Every day | ORAL | Status: DC
  Administered 2021-07-07: 10 mg via ORAL
  Filled 2021-07-07: qty 2

## 2021-07-07 MED ORDER — ASPIRIN EC 325 MG PO TBEC: 325.0000 mg | DELAYED_RELEASE_TABLET | Freq: Every day | ORAL | Status: DC

## 2021-07-07 NOTE — Progress Notes (Signed)
Pharmacy Antibiotic Note  Martin Stafford is a 31 y.o. male admitted on 06/27/2021, now with concern for possible endocarditis.  Pharmacy has been consulted for vancomycin dosing.  There has been no blood culture growth to date. 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 WBC today is 10.1. Patient is afebrile, but taking NSAIDs. He is scheduled for excisional debridement of pilonidal tract today, and a aortic valve replacement on Friday. His serum creatinine is stable at 0.74.   Actual AUC calculated as 425 based on last dose at 2/21 20:12 with peak at 30 ug/mL (2/21 23:11) and  trough at 7 ug/mL.(2/22 07:35)/  Plan: Continue vancomycin 1500 q12h. Goal AUC 400-550.  Will recheck levels in next day or two.    Height: 5\' 11"  (180.3 cm) Weight: 116.3 kg (256 lb 6.3 oz) IBW/kg (Calculated) : 75.3  Temp (24hrs), Avg:97.8 F (36.6 C), Min:97 F (36.1 C), Max:98.6 F (37 C)  Recent Labs  Lab 07/02/21 2311 07/03/21 0605 07/03/21 0735 07/04/21 0245 07/05/21 1522 07/05/21 1640 07/05/21 2100 07/06/21 0330 07/06/21 1700 07/07/21 0403  WBC  --    < >  --    < >  --  11.5* 11.3* 8.4 8.6 10.1  CREATININE  --    < >  --    < > 0.70  --  0.72 0.79 0.72 0.74  VANCOTROUGH  --   --  7*  --   --   --   --   --   --   --   VANCOPEAK 30  --   --   --   --   --   --   --   --   --    < > = values in this interval not displayed.         Allergies  Allergen Reactions   Eggs Or Egg-Derived Products     "eggs"   Peanut-Containing Drug Products     "peanuts"    Antimicrobials this admission: Cefepime 2/17 x 1 Ceftriaxone 2/17>> Vancomycin 2/17>>   Microbiology results: 2/16 BCx x 2 > negF 2/17 BCx x 2 > negF 2/24 BCx x 2 > ngtd 2/24 Wound Cx (pilondial cyst) > ngtd   Thank you for allowing pharmacy to be a part of this patients care.  3/24, Reece Leader, B[facility] Clinical Pharmacist  07/07/2021 1:54 PM   [facility]  pharmacy phone numbers are listed on [email or web address]

## 2021-07-07 NOTE — Progress Notes (Addendum)
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 (N/A) TRANSESOPHAGEAL ECHOCARDIOGRAM (TEE) (N/A) Subjective: Feels weak and sore Maintaining sinus rhythm Chest tube output now minimal Objective: Vital signs in last 24 hours: Temp:  [97 F (36.1 C)-99 F (37.2 C)] 97.7 F (36.5 C) (02/26 0920) Pulse Rate:  [68-98] 87 (02/26 0920) Cardiac Rhythm: Normal sinus rhythm (02/26 0800) Resp:  [11-31] 26 (02/26 0920) BP: (83-133)/(51-87) 118/73 (02/26 0920) SpO2:  [97 %-100 %] 100 % (02/26 0830) Arterial Line BP: (107-192)/(45-75) 151/69 (02/26 0920) FiO2 (%):  [40 %] 40 % (02/25 1502) Weight:  [116.3 kg] 116.3 kg (02/26 0630)  Hemodynamic parameters for last 24 hours: PAP: (21-49)/(9-26) 35/17 CO:  [4.6 L/min-8.5 L/min] 8.5 L/min CI:  [2 L/min/m2-3.7 L/min/m2] 3.7 L/min/m2  Intake/Output from previous day: 02/25 0701 - 02/26 0700 In: 2464.8 [I.V.:1314.5; IV Piggyback:1150.3] Out: 2000 [Urine:1785; Drains:45; Chest Tube:170] Intake/Output this shift: Total I/O In: 649.2 [P.O.:60; I.V.:267.8; Blood:315; IV Piggyback:6.5] Out: 130 [Urine:125; Chest Tube:5]       Exam    General- alert and comfortable    Neck- no JVD, no cervical adenopathy palpable, no carotid bruit   Lungs- clear without rales, wheezes   Cor- regular rate and rhythm, soft systolic flow murmur , no gallop   Abdomen- soft, non-tender   Extremities - warm, non-tender, minimal edema   Neuro- oriented, appropriate, no focal weakness   Lab Results: Recent Labs    07/06/21 1700 07/07/21 0403  WBC 8.6 10.1  HGB 8.4* 7.7*  HCT 26.1* 23.5*  PLT 188 193   BMET:  Recent Labs    07/06/21 1700 07/07/21 0403  NA 137 139  K 3.8 4.0  CL 104 104  CO2 23 26  GLUCOSE 196* 124*  BUN 17 18  CREATININE 0.72 0.74  CALCIUM 8.5* 8.5*    PT/INR:  Recent Labs    07/05/21 1640  LABPROT 19.2*  INR 1.6*   ABG    Component Value Date/Time   PHART 7.406  07/06/2021 1626   HCO3 28.9 (H) 07/06/2021 1626   TCO2 30 07/06/2021 1626   ACIDBASEDEF 1.0 07/05/2021 2159   O2SAT 70.6 07/07/2021 0403   CBG (last 3)  Recent Labs    07/06/21 2349 07/07/21 0400 07/07/21 0749  GLUCAP 117* 117* 115*    Assessment/Plan: S/P Procedure(s) (LRB): Homograft aortic root replacement using 65mm Cryo Aortic Valve. (N/A) TRANSESOPHAGEAL ECHOCARDIOGRAM (TEE) (N/A) Mobilize Diuresis See progression orders DC Swan and A-line OR cultures of valve leaflets and ventricular septal abscess no growth to date Transfuse 1 unit packed cells for expected postoperative blood loss anemia hemoglobin 7.7 with gradual decline over the past 2 days  LOS: 10 days    Martin Stafford 07/07/2021

## 2021-07-07 NOTE — Progress Notes (Signed)
RN notified PICC to be placed Monday.

## 2021-07-07 NOTE — Progress Notes (Signed)
CT surgery PM rounds  Patient set up in chair today Posterior mediastinal chest tube removed Excellent diuresis -3 L today Supplementing potassium this p.m. We will place PICC line and remove neck sheath tomorrow

## 2021-07-08 ENCOUNTER — Encounter (HOSPITAL_COMMUNITY): Payer: Self-pay | Admitting: Cardiothoracic Surgery

## 2021-07-08 ENCOUNTER — Inpatient Hospital Stay (HOSPITAL_COMMUNITY): Payer: BC Managed Care – PPO

## 2021-07-08 DIAGNOSIS — Z952 Presence of prosthetic heart valve: Secondary | ICD-10-CM

## 2021-07-08 DIAGNOSIS — I358 Other nonrheumatic aortic valve disorders: Secondary | ICD-10-CM | POA: Diagnosis not present

## 2021-07-08 DIAGNOSIS — I517 Cardiomegaly: Secondary | ICD-10-CM | POA: Diagnosis not present

## 2021-07-08 DIAGNOSIS — R0989 Other specified symptoms and signs involving the circulatory and respiratory systems: Secondary | ICD-10-CM | POA: Diagnosis not present

## 2021-07-08 DIAGNOSIS — Z452 Encounter for adjustment and management of vascular access device: Secondary | ICD-10-CM | POA: Diagnosis not present

## 2021-07-08 DIAGNOSIS — J9811 Atelectasis: Secondary | ICD-10-CM | POA: Diagnosis not present

## 2021-07-08 LAB — COMPREHENSIVE METABOLIC PANEL
ALT: 90 U/L — ABNORMAL HIGH (ref 0–44)
AST: 83 U/L — ABNORMAL HIGH (ref 15–41)
Albumin: 3.2 g/dL — ABNORMAL LOW (ref 3.5–5.0)
Alkaline Phosphatase: 44 U/L (ref 38–126)
Anion gap: 12 (ref 5–15)
BUN: 10 mg/dL (ref 6–20)
CO2: 34 mmol/L — ABNORMAL HIGH (ref 22–32)
Calcium: 9.2 mg/dL (ref 8.9–10.3)
Chloride: 89 mmol/L — ABNORMAL LOW (ref 98–111)
Creatinine, Ser: 0.81 mg/dL (ref 0.61–1.24)
GFR, Estimated: >60 mL/min (ref >60)
Glucose, Bld: 114 mg/dL — ABNORMAL HIGH (ref 70–99)
Potassium: 3.5 mmol/L (ref 3.5–5.1)
Sodium: 135 mmol/L (ref 135–145)
Total Bilirubin: 0.9 mg/dL (ref 0.3–1.2)
Total Protein: 6.9 g/dL (ref 6.5–8.1)

## 2021-07-08 LAB — TYPE AND SCREEN
ABO/RH(D): O POS
ABO/RH(D): O POS
Antibody Screen: NEGATIVE
Antibody Screen: NEGATIVE
Unit division: 0
Unit division: 0
Unit division: 0
Unit division: 0

## 2021-07-08 LAB — CBC
HCT: 29.8 % — ABNORMAL LOW (ref 39.0–52.0)
Hemoglobin: 9.9 g/dL — ABNORMAL LOW (ref 13.0–17.0)
MCH: 27 pg (ref 26.0–34.0)
MCHC: 33.2 g/dL (ref 30.0–36.0)
MCV: 81.4 fL (ref 80.0–100.0)
Platelets: 272 10*3/uL (ref 150–400)
RBC: 3.66 MIL/uL — ABNORMAL LOW (ref 4.22–5.81)
RDW: 14.8 % (ref 11.5–15.5)
WBC: 12.6 10*3/uL — ABNORMAL HIGH (ref 4.0–10.5)
nRBC: 0 % (ref 0.0–0.2)

## 2021-07-08 LAB — GLUCOSE, CAPILLARY
Glucose-Capillary: 111 mg/dL — ABNORMAL HIGH (ref 70–99)
Glucose-Capillary: 133 mg/dL — ABNORMAL HIGH (ref 70–99)
Glucose-Capillary: 131 mg/dL — ABNORMAL HIGH (ref 70–99)
Glucose-Capillary: 125 mg/dL — ABNORMAL HIGH (ref 70–99)

## 2021-07-08 LAB — BASIC METABOLIC PANEL
Anion gap: 13 (ref 5–15)
BUN: 12 mg/dL (ref 6–20)
CO2: 36 mmol/L — ABNORMAL HIGH (ref 22–32)
Calcium: 9.4 mg/dL (ref 8.9–10.3)
Chloride: 84 mmol/L — ABNORMAL LOW (ref 98–111)
Creatinine, Ser: 0.63 mg/dL (ref 0.61–1.24)
GFR, Estimated: >60 mL/min (ref >60)
Glucose, Bld: 133 mg/dL — ABNORMAL HIGH (ref 70–99)
Potassium: 3.6 mmol/L (ref 3.5–5.1)
Sodium: 133 mmol/L — ABNORMAL LOW (ref 135–145)

## 2021-07-08 LAB — BPAM RBC
Blood Product Expiration Date: 202303242359
Blood Product Expiration Date: 202303242359
Blood Product Expiration Date: 202303242359
Blood Product Expiration Date: 202303242359
ISSUE DATE / TIME: 202302260858
ISSUE DATE / TIME: 202302270630
ISSUE DATE / TIME: 202302270726
ISSUE DATE / TIME: 202302270726
Unit Type and Rh: 5100
Unit Type and Rh: 5100
Unit Type and Rh: 5100
Unit Type and Rh: 5100

## 2021-07-08 LAB — COOXEMETRY PANEL
Carboxyhemoglobin: 0.7 % (ref 0.5–1.5)
Methemoglobin: 2 % — ABNORMAL HIGH (ref 0.0–1.5)
O2 Saturation: 68.7 %
Total hemoglobin: 10.3 g/dL — ABNORMAL LOW (ref 12.0–16.0)

## 2021-07-08 MED ORDER — FUROSEMIDE 10 MG/ML IJ SOLN
40.0000 mg | Freq: Every day | INTRAMUSCULAR | Status: DC
  Administered 2021-07-09: 40 mg via INTRAVENOUS
  Filled 2021-07-08: qty 4

## 2021-07-08 MED ORDER — METOCLOPRAMIDE HCL 5 MG/ML IJ SOLN
10.0000 mg | Freq: Three times a day (TID) | INTRAMUSCULAR | Status: DC
  Administered 2021-07-08 (×2): 10 mg via INTRAVENOUS
  Filled 2021-07-08 (×2): qty 2

## 2021-07-08 MED ORDER — SODIUM CHLORIDE 0.9% FLUSH
10.0000 mL | Freq: Two times a day (BID) | INTRAVENOUS | Status: DC
  Administered 2021-07-08 – 2021-07-12 (×9): 10 mL

## 2021-07-08 MED ORDER — METOPROLOL TARTRATE 12.5 MG HALF TABLET
12.5000 mg | ORAL_TABLET | Freq: Two times a day (BID) | ORAL | Status: DC
  Administered 2021-07-08 (×2): 12.5 mg via ORAL
  Filled 2021-07-08 (×2): qty 1

## 2021-07-08 MED ORDER — SODIUM CHLORIDE 0.9 % IV SOLN
10.0000 mg/kg | Freq: Every day | INTRAVENOUS | Status: DC
  Administered 2021-07-08 – 2021-07-12 (×5): 900 mg via INTRAVENOUS
  Filled 2021-07-08 (×6): qty 18

## 2021-07-08 MED ORDER — METOCLOPRAMIDE HCL 5 MG/ML IJ SOLN
10.0000 mg | Freq: Four times a day (QID) | INTRAMUSCULAR | Status: DC
  Administered 2021-07-08 – 2021-07-10 (×7): 10 mg via INTRAVENOUS
  Filled 2021-07-08 (×7): qty 2

## 2021-07-08 MED ORDER — SODIUM CHLORIDE 0.9% FLUSH: 10.0000 mL | INTRAVENOUS | Status: DC | PRN

## 2021-07-08 MED ORDER — MILRINONE LACTATE IN DEXTROSE 20-5 MG/100ML-% IV SOLN
0.1250 ug/kg/min | INTRAVENOUS | Status: DC
  Administered 2021-07-08 – 2021-07-09 (×2): 0.125 ug/kg/min via INTRAVENOUS
  Filled 2021-07-08 (×2): qty 100

## 2021-07-08 MED ORDER — POTASSIUM CHLORIDE 10 MEQ/50ML IV SOLN
10.0000 meq | INTRAVENOUS | Status: AC
  Administered 2021-07-08 (×3): 10 meq via INTRAVENOUS
  Filled 2021-07-08 (×3): qty 50

## 2021-07-08 MED ORDER — BOOST PO LIQD
237.0000 mL | Freq: Three times a day (TID) | ORAL | Status: DC
Start: 2021-07-08 — End: 2021-07-12
  Administered 2021-07-08 – 2021-07-12 (×8): 237 mL via ORAL
  Filled 2021-07-08 (×16): qty 237

## 2021-07-08 MED ORDER — POTASSIUM CHLORIDE ER 10 MEQ PO TBCR
20.0000 meq | EXTENDED_RELEASE_TABLET | Freq: Every day | ORAL | Status: DC
  Administered 2021-07-08: 20 meq via ORAL
  Filled 2021-07-08 (×3): qty 2

## 2021-07-08 NOTE — Progress Notes (Incomplete)
PHARMACY CONSULT NOTE FOR:  OUTPATIENT  PARENTERAL ANTIBIOTIC THERAPY (OPAT)  Indication:  Regimen:  End date:   IV antibiotic discharge orders are pended. To discharging provider:  please sign these orders via discharge navigator,  Select New Orders & click on the button choice - Manage This Unsigned Work.     Thank you for allowing pharmacy to be a part of this patient's care.  [name] 07/08/2021, 8:04 AM

## 2021-07-08 NOTE — Progress Notes (Signed)
Peripherally Inserted Central Catheter Placement  The IV Nurse has discussed with the patient and/or persons authorized to consent for the patient, the purpose of this procedure and the potential benefits and risks involved with this procedure.  The benefits include less needle sticks, lab draws from the catheter, and the patient may be discharged home with the catheter. Risks include, but not limited to, infection, bleeding, blood clot (thrombus formation), and puncture of an artery; nerve damage and irregular heartbeat and possibility to perform a PICC exchange if needed/ordered by physician.  Alternatives to this procedure were also discussed.  Bard Power PICC patient education guide, fact sheet on infection prevention and patient information card has been provided to patient /or left at bedside.    PICC Placement Documentation  PICC Double Lumen 07/08/21 Right Basilic 41 cm 0 cm (Active)  Indication for Insertion or Continuance of Line Prolonged intravenous therapies 07/08/21 0911  Exposed Catheter (cm) 0 cm 07/08/21 0911  Site Assessment Clean, Dry, Intact 07/08/21 0911  Lumen #1 Status Flushed;Blood return noted;Saline locked 07/08/21 0911  Lumen #2 Status Flushed;Blood return noted;Saline locked 07/08/21 0911  Dressing Type Transparent 07/08/21 0911  Dressing Status Antimicrobial disc in place 07/08/21 0911  Dressing Change Due 07/15/21 07/08/21 0911       Martin Stafford 07/08/2021, 9:13 AM

## 2021-07-08 NOTE — Progress Notes (Signed)
EVENING ROUNDS NOTE :     [address]       [place] [address]             [phone]                 3 Days Post-Op Procedure(s) (LRB): Homograft aortic root replacement using 70mm Cryo Aortic Valve. (N/A) TRANSESOPHAGEAL ECHOCARDIOGRAM (TEE) (N/A)   Total Length of Stay:  LOS: 11 days  Events:   No events Resting comfortably    BP (!) 142/71 (BP Location: Left Arm)    Pulse 95    Temp 98.2 F (36.8 C) (Oral)    Resp 15    Ht 5\' 11"  (1.803 m)    Wt 115.6 kg    SpO2 95%    BMI 35.54 kg/m   CVP:  [0 mmHg-24 mmHg] 24 mmHg      cefTRIAXone (ROCEPHIN)  IV Stopped (07/07/21 1808)   DAPTOmycin (CUBICIN)  IV     milrinone 0.125 mcg/kg/min (07/08/21 1600)    I/O last 3 completed shifts: In: 3169.8 [P.O.:120; I.V.:1170.8; Blood:605; IV [id number] Out: [id number] [Urine:8380; Drains:120; Chest Tube:225]   CBC Latest Ref Rng & Units 07/08/2021 07/07/2021 07/06/2021  WBC 4.0 - 10.5 K/uL 12.6(H) 10.1 8.6  Hemoglobin 13.0 - 17.0 g/dL 9.9(L) 7.7(L) 8.4(L)  Hematocrit 39.0 - 52.0 % 29.8(L) 23.5(L) 26.1(L)  Platelets 150 - 400 K/uL 272 193 188    BMP Latest Ref Rng & Units 07/08/2021 07/07/2021 07/07/2021  Glucose 70 - 99 mg/dL 114(H) 115(H) 124(H)  BUN 6 - 20 mg/dL 10 15 18   Creatinine 0.61 - 1.24 mg/dL 0.81 0.73 0.74  Sodium 135 - 145 mmol/L 135 136 139  Potassium 3.5 - 5.1 mmol/L 3.5 3.2(L) 4.0  Chloride 98 - 111 mmol/L 89(L) 94(L) 104  CO2 22 - 32 mmol/L 34(H) 29 26  Calcium 8.9 - 10.3 mg/dL 9.2 8.7(L) 8.5(L)    ABG    Component Value Date/Time   PHART 7.406 07/06/2021 1626   PCO2ART 45.7 07/06/2021 1626   PO2ART 86 07/06/2021 1626   HCO3 28.9 (H) 07/06/2021 1626   TCO2 30 07/06/2021 1626   ACIDBASEDEF 1.0 07/05/2021 2159   O2SAT 68.7 07/08/2021 0406       Melodie Bouillon, MD 07/08/2021 4:46 PM

## 2021-07-08 NOTE — Progress Notes (Signed)
Progress Note  Patient Name: CORNELIS EADIE Date of Encounter: 07/08/2021  [facility] Cardiologist: Sanda Klein, MD   Subjective   Sitting up in chair. Some back pain, improves with repositioning. Fair appetite.  Inpatient Medications    Scheduled Meds:  acetaminophen  1,000 mg Oral Q6H   Or   acetaminophen (TYLENOL) oral liquid 160 mg/5 mL  1,000 mg Per Tube Q6H   aspirin EC  81 mg Oral Daily   bisacodyl  10 mg Oral Daily   Or   bisacodyl  10 mg Rectal Daily   Chlorhexidine Gluconate Cloth  6 each Topical Daily   docusate sodium  200 mg Oral Daily   furosemide  40 mg Intravenous Daily   insulin aspart  0-24 Units Subcutaneous TID WC   lactose free nutrition  237 mL Oral TID WC   mouth rinse  15 mL Mouth Rinse BID   metoCLOPramide (REGLAN) injection  10 mg Intravenous Q8H   metoprolol tartrate  12.5 mg Oral BID   pantoprazole  40 mg Oral Daily   potassium chloride  20 mEq Oral Daily   sodium chloride flush  10-40 mL Intracatheter Q12H   Continuous Infusions:  cefTRIAXone (ROCEPHIN)  IV Stopped (07/07/21 1808)   DAPTOmycin (CUBICIN)  IV     milrinone 0.125 mcg/kg/min (07/08/21 1200)   PRN Meds: metoprolol tartrate, morphine injection, ondansetron (ZOFRAN) IV, oxyCODONE, sodium chloride flush, traMADol   Vital Signs    Vitals:   07/08/21 0900 07/08/21 1000 07/08/21 1100 07/08/21 1200  BP: 139/77  (!) 144/85 140/85  Pulse: 96 94 92 91  Resp: (!) 25 (!) 23 17 17   Temp:   98.2 F (36.8 C)   TempSrc:   Oral   SpO2: 97% 94% 96% 94%  Weight:      Height:        Intake/Output Summary (Last 24 hours) at 07/08/2021 1545 Last data filed at 07/08/2021 1200 Gross per 24 hour  Intake 1321.67 ml  Output 6690 ml  Net -5368.33 ml   Last 3 Weights 07/08/2021 07/07/2021 07/06/2021  Weight (lbs) 254 lb 13.6 oz 256 lb 6.3 oz 253 lb 4.9 oz  Weight (kg) 115.6 kg 116.3 kg 114.9 kg      Telemetry    NSR - Personally Reviewed  ECG    No new since 07/06/21 -  Personally Reviewed  Physical Exam   GEN: No acute distress.   Neck: No JVD Cardiac: RRR, no murmurs, rubs, or gallops.  Respiratory: Clear to auscultation bilaterally. GI: Soft, nontender, non-distended  MS: No edema; No deformity. Neuro:  Nonfocal  Psych: Normal affect   Labs    High Sensitivity Troponin:   Recent Labs  Lab 06/24/21 0903 06/27/21 0547 06/27/21 0740 06/27/21 1237 06/27/21 1549  TROPONINIHS 4 122* 140* 114* 109*     Chemistry Recent Labs  Lab 07/05/21 2100 07/05/21 2159 07/06/21 0330 07/06/21 1012 07/06/21 1700 07/07/21 0403 07/07/21 1750 07/08/21 0526  NA 136   < > 137   < > 137 139 136 135  K 4.7   < > 4.7   < > 3.8 4.0 3.2* 3.5  CL 105  --  106  --  104 104 94* 89*  CO2 23  --  24  --  23 26 29  34*  GLUCOSE 161*  --  143*  --  196* 124* 115* 114*  BUN 12  --  12  --  17 18 15 10   CREATININE 0.72  --  0.79  --  0.72 0.74 0.73 0.81  CALCIUM 8.0*  --  8.4*  --  8.5* 8.5* 8.7* 9.2  MG 2.9*  --  2.7*  --  2.3  --   --   --   PROT  --   --   --   --   --   --   --  6.9  ALBUMIN  --   --   --   --   --   --   --  3.2*  AST  --   --   --   --   --   --   --  83*  ALT  --   --   --   --   --   --   --  90*  ALKPHOS  --   --   --   --   --   --   --  44  BILITOT  --   --   --   --   --   --   --  0.9  GFRNONAA >60  --  >60  --  >60 >60 >60 >60  ANIONGAP 8  --  7  --  10 9 13 12    < > = values in this interval not displayed.    Lipids No results for input(s): CHOL, TRIG, HDL, LABVLDL, LDLCALC, CHOLHDL in the last 168 hours.  Hematology Recent Labs  Lab 07/06/21 1700 07/07/21 0403 07/08/21 0406  WBC 8.6 10.1 12.6*  RBC 3.22* 2.91* 3.66*  HGB 8.4* 7.7* 9.9*  HCT 26.1* 23.5* 29.8*  MCV 81.1 80.8 81.4  MCH 26.1 26.5 27.0  MCHC 32.2 32.8 33.2  RDW 14.3 14.5 14.8  PLT 188 193 272   Thyroid  Recent Labs  Lab 07/02/21 0459  TSH 1.260    BNPNo results for input(s): BNP, PROBNP in the last 168 hours.  DDimer No results for input(s): DDIMER  in the last 168 hours.   Radiology    DG Chest Port 1 View  Result Date: 07/08/2021 CLINICAL DATA:  Post AVR, sore chest EXAM: PORTABLE CHEST 1 VIEW COMPARISON:  Portable exam at 0515 hrs compared to 07/07/2021 FINDINGS: RIGHT jugular line tip projects over SVC. Enlargement of cardiac silhouette and pulmonary vascular congestion. Linear subsegmental atelectasis at RIGHT base. Atelectasis versus consolidation LEFT lower lobe. No pleural effusion or pneumothorax. Gaseous distention of stomach. IMPRESSION: Atelectasis versus consolidation LEFT lower lobe. Electronically Signed   By: Lavonia Dana M.D.   On: 07/08/2021 08:28   DG Chest Port 1 View  Result Date: 07/07/2021 CLINICAL DATA:  Status post aortic valve replacement. Follow-up exam. EXAM: PORTABLE CHEST 1 VIEW COMPARISON:  07/06/2021 and older studies. FINDINGS: Since the previous day's exam, the endotracheal tube and nasal/orogastric tube have been removed. The mediastinal tube and right internal jugular Swan-Ganz catheter are unchanged. Stable cardiac silhouette.  No mediastinal widening. There is opacity at the lung base that now obscures the hemidiaphragm, increased from the previous day's study, most likely a combination of pleural fluid and atelectasis. Bilateral central vascular congestion is accentuated by low lung volumes. Remainder of the lungs is clear. No pneumothorax. IMPRESSION: 1. Increased opacity at the left lung base compared to the previous day's exam, most likely a combination of a small effusion and atelectasis. 2. No convincing pulmonary edema. No pneumothorax and no mediastinal widening. 3. Remaining support apparatus is stable and well positioned. Electronically Signed   By: Dedra Skeens.D.  On: 07/07/2021 08:32   [place] EKG SITE RITE  Result Date: 07/07/2021 If [facility] image not attached, placement could not be confirmed due to current cardiac rhythm.   Cardiac Studies   Echocardiogram 06/27/2021    1. The aortic valve  leafets are not well visualized but the valve appears  tricuspid with no significant calcification/thickening. There is very  eccentric, severe aortic regurgitation with diffuse color flow seen in the  LVOT. There is diastolic flow  reversal noted in the abdominal aorta. No distinct vegetations visualized.  No dissection flap seen but minimal visualization of the ascending aorta.  Recommend TEE +/- CTA for further evaluation.   2. Left ventricular ejection fraction, by estimation, is 55 to 60%. The  left ventricle has normal function. The left ventricle has no regional  wall motion abnormalities. There is moderate concentric left ventricular  hypertrophy. Left ventricular  diastolic parameters are consistent with Grade I diastolic dysfunction  (impaired relaxation).   3. Right ventricular systolic function is normal. The right ventricular  size is normal.   4. Left atrial size was mildly dilated.   5. A small pericardial effusion is present. The pericardial effusion is  circumferential.   6. The mitral valve is normal in structure. Trivial mitral valve  regurgitation.   7. The inferior vena cava is dilated in size with <50% respiratory  variability, suggesting right atrial pressure of 15 mmHg.   8. Aortic dilatation noted. There is mild dilatation of the aortic root,  measuring 39 mm.    TEE 07/01/21 IMPRESSIONS   1. Left ventricular ejection fraction, by estimation, is 60 to 65%. The  left ventricle has normal function. There is moderate left ventricular  hypertrophy.   2. Right ventricular systolic function is normal. The right ventricular  size is normal.   3. No left atrial/left atrial appendage thrombus was detected.   4. A small pericardial effusion is present.   5. The mitral valve is normal in structure. Trivial mitral valve  regurgitation.   6. The noncoronary cusp of the aortic valve is abnormally thickened. The  basal portion has an almost fenestrated appearance,  concerning for abscess  formation. Aortic regurgitation is eccentric and severe with backflow from  descending aorta.. The aortic  valve is tricuspid.   CT coronary 07/02/21 IMPRESSION: 1. Coronary calcium score of 0.  2. Normal coronary origin with right dominance.  3. Normal coronary arteries.  4. Mobile density on the [place] consistent with vegetation.  5. Thickening at the base of the [place] consistent with vegetation.  6. Pseudoaneurysm of the LVOT under the [place] consistent with aortic root/LVOT abscess.  7. Diverticulum of the basal septum.  8. Mid LAD myocardial bridge (normal variant).  Patient Profile     31 y.o. 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 Prescott Gum) -POD 3 -tissue cultures NGTD, blood cultures NGTD -antibiotics per ID -continue aspirin 81 mg daily given valve -post op progression per CT surgery -remains on milrinone -admission weight 109.5 kg, weight today 115.6 kg but net negative 600 ml, I/O inaccurate  For questions or updates, please contact [facility] Please consult [email or web address] for contact info under     Signed, Buford Dresser, MD  07/08/2021, 3:45 PM

## 2021-07-08 NOTE — Progress Notes (Signed)
3 Days Post-Op Procedure(s) (LRB): Homograft aortic root replacement using 32mm Cryo Aortic Valve. (N/A) TRANSESOPHAGEAL ECHOCARDIOGRAM (TEE) (N/A) Subjective: States pain is better  Objective: Vital signs in last 24 hours: Temp:  [97.7 F (36.5 C)-99.2 F (37.3 C)] 99 F (37.2 C) (02/27 0700) Pulse Rate:  [86-103] 100 (02/27 0800) Cardiac Rhythm: Normal sinus rhythm (02/27 0800) Resp:  [15-40] 25 (02/27 0800) BP: (116-139)/(67-81) 136/78 (02/27 0800) SpO2:  [93 %-100 %] 95 % (02/27 0800) Arterial Line BP: (151-153)/(68-69) 151/69 (02/26 0920) Weight:  [115.6 kg] 115.6 kg (02/27 0500)  Hemodynamic parameters for last 24 hours: PAP: (49)/(27) 49/27 CVP:  [0 mmHg-22 mmHg] 17 mmHg  Intake/Output from previous day: 02/26 0701 - 02/27 0700 In: 2351.6 [P.O.:120; I.V.:752.8; Blood:605; IV Piggyback:873.8] Out: 8070 [Urine:7850; Drains:75; Chest Tube:145] Intake/Output this shift: Total I/O In: 76.4 [P.O.:60; I.V.:16.4] Out: 350 [Urine:350]       Exam    General- alert and comfortable    Neck- no JVD, no cervical adenopathy palpable, no carotid bruit   Lungs- clear without rales, wheezes. Sternal incision clean, dry   Cor- regular rate and rhythm, systolic flow murmur ,  no gallop   Abdomen- soft, non-tender   Extremities - warm, non-tender, minimal edema   Neuro- oriented, appropriate, no focal weakness    Lab Results: Recent Labs    07/07/21 0403 07/08/21 0406  WBC 10.1 12.6*  HGB 7.7* 9.9*  HCT 23.5* 29.8*  PLT 193 272   BMET:  Recent Labs    07/07/21 1750 07/08/21 0526  NA 136 135  K 3.2* 3.5  CL 94* 89*  CO2 29 34*  GLUCOSE 115* 114*  BUN 15 10  CREATININE 0.73 0.81  CALCIUM 8.7* 9.2    PT/INR:  Recent Labs    07/05/21 1640  LABPROT 19.2*  INR 1.6*   ABG    Component Value Date/Time   PHART 7.406 07/06/2021 1626   HCO3 28.9 (H) 07/06/2021 1626   TCO2 30 07/06/2021 1626   ACIDBASEDEF 1.0 07/05/2021 2159   O2SAT 68.7 07/08/2021 0406    CBG (last 3)  Recent Labs    07/07/21 1139 07/07/21 1658 07/08/21 0649  GLUCAP 109* 114* 111*    Assessment/Plan: S/P Procedure(s) (LRB): Homograft aortic root replacement using 9mm Cryo Aortic Valve. (N/A) TRANSESOPHAGEAL ECHOCARDIOGRAM (TEE) (N/A) 8 L diuresis yesterday- decrease lasix to q am Place PICC and remove sheath PT for mobilization Decrease milrinone to .125 mcg    LOS: 11 days    Martin Stafford 07/08/2021

## 2021-07-08 NOTE — Progress Notes (Signed)
[facility] for Infectious Disease   Reason for visit: Follow up on culture negative endocarditis  Interval History: s/p aortic root replacement POD #3 No fever, no chills  Physical Exam: Constitutional:  Vitals:   07/08/21 0900 07/08/21 1000  BP: 139/77   Pulse: 96 94  Resp: (!) 25 (!) 23  Temp:    SpO2: 97% 94%   patient appears in NAD Respiratory: Normal respiratory effort; CTA B Cardiovascular: RRR   Review of Systems: Constitutional: negative for fevers and chills Gastrointestinal: negative for nausea and diarrhea  Lab Results  Component Value Date   WBC 12.6 (H) 07/08/2021   HGB 9.9 (L) 07/08/2021   HCT 29.8 (L) 07/08/2021   MCV 81.4 07/08/2021   PLT 272 07/08/2021    Lab Results  Component Value Date   CREATININE 0.81 07/08/2021   BUN 10 07/08/2021   NA 135 07/08/2021   K 3.5 07/08/2021   CL 89 (L) 07/08/2021   CO2 34 (H) 07/08/2021    Lab Results  Component Value Date   ALT 90 (H) 07/08/2021   AST 83 (H) 07/08/2021   ALKPHOS 44 07/08/2021     Microbiology: Recent Results (from the past 240 hour(s))  Culture, blood (Routine X 2) w Reflex to ID Panel     Status: None   Collection Time: 06/28/21  1:23 PM   Specimen: BLOOD  Result Value Ref Range Status   Specimen Description BLOOD LEFT ANTECUBITAL  Final   Special Requests   Final    BOTTLES DRAWN AEROBIC AND ANAEROBIC Blood Culture adequate volume   Culture   Final    NO GROWTH 5 DAYS Performed at [facility], [address] [place], [place] [address]    Report Status 07/03/2021 FINAL  Final  Culture, blood (Routine X 2) w Reflex to ID Panel     Status: None   Collection Time: 06/28/21  1:28 PM   Specimen: BLOOD LEFT HAND  Result Value Ref Range Status   Specimen Description BLOOD LEFT HAND  Final   Special Requests   Final    BOTTLES DRAWN AEROBIC ONLY Blood Culture results may not be optimal due to an inadequate volume of blood received in culture bottles   Culture   Final     NO GROWTH 5 DAYS Performed at [facility], [place] [address] [place], [place] [address]    Report Status 07/03/2021 FINAL  Final  Surgical pcr screen     Status: None   Collection Time: 07/01/21  9:22 PM   Specimen: Nasal Mucosa; Nasal Swab  Result Value Ref Range Status   MRSA, PCR NEGATIVE NEGATIVE Final   Staphylococcus aureus NEGATIVE NEGATIVE Final    Comment: (NOTE) The Xpert SA Assay (FDA approved for NASAL specimens in patients 58 years of age and older), is one component of a comprehensive surveillance program. It is not intended to diagnose infection nor to guide or monitor treatment. Performed at [facility], [place] [address], [place], [place] [address]   Urine Culture     Status: None   Collection Time: 07/01/21 11:15 PM   Specimen: Urine, Clean Catch  Result Value Ref Range Status   Specimen Description URINE, CLEAN CATCH  Final   Special Requests SET UP ON GRAY TOP URINE  Final   Culture   Final    NO GROWTH Performed at [facility], [place] [address], [place], [place] [address]  Report Status 07/03/2021 FINAL  Final  Aerobic Culture w Gram Stain (superficial specimen)     Status: None   Collection Time: 07/02/21  1:25 PM   Specimen: Wound  Result Value Ref Range Status   Specimen Description WOUND  Final   Special Requests PILONDIAL CYST/ SINUS  Final   Gram Stain   Final    RARE WBC PRESENT,BOTH PMN AND MONONUCLEAR RARE GRAM NEGATIVE RODS    Culture   Final    NO GROWTH 2 DAYS Performed at [facility], [address] [place], [place] [address]    Report Status 07/04/2021 FINAL  Final  Anaerobic culture w Gram Stain     Status: None (Preliminary result)   Collection Time: 07/05/21 10:08 AM   Specimen: PATH Cytology Misc. fluid; Body Fluid  Result Value Ref Range Status   Specimen Description FLUID  Final   Special Requests NONE  Final   Gram Stain   Final    NO WBC SEEN NO ORGANISMS SEEN Performed at [facility], [address], [place], [place] [address]    Culture   Final    NO ANAEROBES ISOLATED; CULTURE IN PROGRESS FOR 5 DAYS   Report Status PENDING  Incomplete  Body fluid culture w Gram Stain     Status: None (Preliminary result)   Collection Time: 07/05/21 10:08 AM   Specimen: PATH Cytology Misc. fluid; Body Fluid  Result Value Ref Range Status   Specimen Description FLUID  Final   Special Requests NONE  Final   Gram Stain   Final    RARE WBC PRESENT, PREDOMINANTLY MONONUCLEAR NO ORGANISMS SEEN    Culture   Final    NO GROWTH 3 DAYS Performed at [facility], [address], [place], [place] [address]    Report Status PENDING  Incomplete  Aerobic/Anaerobic Culture w Gram Stain (surgical/deep wound)     Status: None (Preliminary result)   Collection Time: 07/05/21  1:30 PM   Specimen: Aortic Valve Leaflets; Tissue  Result Value Ref Range Status   Specimen Description TISSUE  Final   Special Requests   Final    AORTIC VALVE LEAFLETS  S/O FOR MICRO RNA ANALYSIS TO [facility]   Gram Stain   Final    NO SQUAMOUS EPITHELIAL CELLS SEEN RARE WBC SEEN NO ORGANISMS SEEN    Culture   Final    NO GROWTH 3 DAYS NO ANAEROBES ISOLATED; CULTURE IN PROGRESS FOR 5 DAYS Performed at [facility], [place] [address] [place], [place] [address]    Report Status PENDING  Incomplete    Impression/Plan:  1. Culture negative endocarditis - no positive cultures to date.  Operative cultures of tissue with no growth.  Request sent to [facility] for PCR testing.   Will continue with daptomycin and ceftriaxone.    2. Medication monitoring - transitioning to daptomycin for ease of dosing, decreased side effect profile.  Will check a baseline CK, though anticipate some elevation and will trend.    3.  Access - now with a picc line.

## 2021-07-08 NOTE — Evaluation (Addendum)
Physical Therapy Evaluation Patient Details Name: Martin Stafford MRN: [id number] DOB: 15-Mar-1991 Today's Date: 07/08/2021  History of Present Illness  Pt is a 31 y.o. M who presents 07/05/2021 with nonradiating left sided chest pain, chills, sore throat. 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 S/p excision of pilonidal cyst and placement of wound VAC. 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 On PT evaluation, pt with nausea and diaphoresis, but agreeable to participate. Able to perform limited ambulation from bed to chair with min guard-min assist. Participated in both seated and standing exercises. Suspect steady progress once nausea improves based on age, PLOF, motivation. Will continue to follow acutely to progress as tolerated.     Recommendations for follow up therapy are one component of a multi-disciplinary discharge planning process, led by the attending physician.  Recommendations may be updated based on patient status, additional functional criteria and insurance authorization.  Follow Up Recommendations No PT follow up    Assistance Recommended at Discharge PRN  Patient can return home with the following  A little help with walking and/or transfers;A little help with bathing/dressing/bathroom    Equipment Recommendations None recommended by PT  Recommendations for Other Services       Functional Status Assessment Patient has had a recent decline in their functional status and demonstrates the ability to make significant improvements in function in a reasonable and predictable amount of time.     Precautions / Restrictions Precautions Precautions: Fall;Other (comment);Sternal Precaution Comments: wound vac on bottom Restrictions Weight Bearing Restrictions: Yes Other Position/Activity Restrictions: sternal precautions       Mobility  Bed Mobility Overal bed mobility: Needs Assistance Bed Mobility: Supine to Sit     Supine to sit: Min assist     General bed mobility comments: Cues for technique, minA for trunk assist to upright    Transfers Overall transfer level: Needs assistance Equipment used: None Transfers: Sit to/from Stand Sit to Stand: Min assist           General transfer comment: Pt holding onto pillow, light minA to rise    Ambulation/Gait Ambulation/Gait assistance: Min guard Gait Distance (Feet): 3 Feet Assistive device: None Gait Pattern/deviations: Step-through pattern, Decreased stride length       General Gait Details: Min guard for safety, no overt LOB noted. Able to march in place x 10  Stairs            Wheelchair Mobility    Modified Rankin (Stroke Patients Only)       Balance Overall balance assessment: Mild deficits observed, not formally tested                                           Pertinent Vitals/Pain Pain Assessment Pain Assessment: Faces Faces Pain Scale: Hurts a little bit Pain Location: incisional Pain Descriptors / Indicators: Guarding Pain Intervention(s): Limited activity within patient's tolerance, Monitored during session    Home Living Family/patient expects to be discharged to:: Private residence Living Arrangements: Spouse/significant other (fiance, 4, 8 y.o.) Available Help at Discharge: Family Type of Home: Apartment Home Access: Level entry       Home Layout: One level Home Equipment: Shower seat      Prior Function Prior Level of Function : Independent/Modified  Independent             Mobility Comments: Works for [employer]. [employer], does involve lifting       Hand Dominance   Dominant Hand: Right    Extremity/Trunk Assessment   Upper Extremity Assessment Upper Extremity Assessment: Overall WFL for tasks assessed    Lower Extremity Assessment Lower Extremity  Assessment: RLE deficits/detail;LLE deficits/detail RLE Deficits / Details: Grossly 4+/5 LLE Deficits / Details: Grossly 4+/5    Cervical / Trunk Assessment Cervical / Trunk Assessment: Normal  Communication   Communication: Other (comment) (soft spoken)  Cognition Arousal/Alertness: Awake/alert Behavior During Therapy: WFL for tasks assessed/performed Overall Cognitive Status: Within Functional Limits for tasks assessed                                          General Comments  BP 158/86 (99), 95 HR, 91% on RA    Exercises General Exercises - Lower Extremity Long Arc Quad: Both, 5 reps, Seated Hip Flexion/Marching: Both, 5 reps, Seated   Assessment/Plan    PT Assessment Patient needs continued PT services  PT Problem List Decreased strength;Decreased activity tolerance;Decreased balance;Decreased mobility       PT Treatment Interventions Gait training;Stair training;Functional mobility training;Therapeutic activities;Therapeutic exercise;Balance training;Patient/family education    PT Goals (Current goals can be found in the Care Plan section)  Acute Rehab PT Goals Patient Stated Goal: feel better PT Goal Formulation: With patient Time For Goal Achievement: 07/22/21 Potential to Achieve Goals: Good    Frequency Min 3X/week     Co-evaluation               AM-PAC PT "6 Clicks" Mobility  Outcome Measure Help needed turning from your back to your side while in a flat bed without using bedrails?: None Help needed moving from lying on your back to sitting on the side of a flat bed without using bedrails?: A Little Help needed moving to and from a bed to a chair (including a wheelchair)?: A Little Help needed standing up from a chair using your arms (e.g., wheelchair or bedside chair)?: A Little Help needed to walk in hospital room?: A Little Help needed climbing 3-5 steps with a railing? : A Lot 6 Click Score: 18    End of Session   Activity  Tolerance: Other (comment) (limited by nausea) Patient left: in chair;with call bell/phone within reach;with family/visitor present Nurse Communication: Mobility status PT Visit Diagnosis: Unsteadiness on feet (R26.81);Difficulty in walking, not elsewhere classified (R26.2)    Time: 8185-6314 PT Time Calculation (min) (ACUTE ONLY): 19 min   Charges:   PT Evaluation $PT Eval Moderate Complexity: 1 Mod          Lillia Pauls, PT, DPT Acute Rehabilitation Services Pager [phone] Office [phone]   Norval Morton 07/08/2021, 3:50 PM

## 2021-07-08 NOTE — Progress Notes (Signed)
Pharmacy Antibiotic Note  Martin Stafford is a 31 y.o. male admitted on 06/27/2021 and found to have AV endocarditis now s/p aortic root replacement on 2/24. Will plan to transition the patient's Vancomycin over to Daptomycin in preparation for eventual OPAT, continue Rocephin for now.   Plan: - D/c Vancomycin - Start Daptomycin 900 mg (~10 mg/kg AdjBW) every 24 hours - Continue Rocephin 2g IV every 24 hours - Will check CK in the AM but would expect some elevation with recent surgery - will monitor trends - Will continue to follow renal function, culture results, and LOT plans  Height: 5\' 11"  (180.3 cm) Weight: 115.6 kg (254 lb 13.6 oz) IBW/kg (Calculated) : 75.3  Temp (24hrs), Avg:98.7 F (37.1 C), Min:98 F (36.7 C), Max:99.2 F (37.3 C)  Recent Labs  Lab 07/02/21 2311 07/03/21 0605 07/03/21 0735 07/04/21 0245 07/05/21 2100 07/06/21 0330 07/06/21 1700 07/07/21 0403 07/07/21 1750 07/08/21 0406 07/08/21 0526  WBC  --    < >  --    < > 11.3* 8.4 8.6 10.1  --  12.6*  --   CREATININE  --    < >  --    < > 0.72 0.79 0.72 0.74 0.73  --  0.81  VANCOTROUGH  --   --  7*  --   --   --   --   --   --   --   --   VANCOPEAK 30  --   --   --   --   --   --   --   --   --   --    < > = values in this interval not displayed.         Allergies  Allergen Reactions   Eggs Or Egg-Derived Products     "eggs"   Peanut-Containing Drug Products     "peanuts"    Antimicrobials this admission: Cefepime 2/17 x 1 Ceftriaxone 2/17>> Vancomycin 2/17>> 2/27 Daptomycin 2/27 >>   Microbiology results: 2/16 BCx x 2 > negF 2/17 BCx x 2 > negF 2/24 BCx x 2 > ngtd 2/24 Wound Cx (pilondial cyst) > ngtd  Thank you for allowing pharmacy to be a part of this patients care.  3/24, PharmD, BCPS Infectious Diseases Clinical Pharmacist 07/08/2021 11:36 AM   **Pharmacist phone directory can now be found on [email or web address] ([email or web address]).  Listed under [facility].

## 2021-07-09 ENCOUNTER — Inpatient Hospital Stay (HOSPITAL_COMMUNITY): Payer: BC Managed Care – PPO

## 2021-07-09 DIAGNOSIS — I358 Other nonrheumatic aortic valve disorders: Secondary | ICD-10-CM | POA: Diagnosis not present

## 2021-07-09 DIAGNOSIS — J90 Pleural effusion, not elsewhere classified: Secondary | ICD-10-CM | POA: Diagnosis not present

## 2021-07-09 DIAGNOSIS — Z952 Presence of prosthetic heart valve: Secondary | ICD-10-CM | POA: Diagnosis not present

## 2021-07-09 LAB — COMPREHENSIVE METABOLIC PANEL
ALT: 70 U/L — ABNORMAL HIGH (ref 0–44)
AST: 54 U/L — ABNORMAL HIGH (ref 15–41)
Albumin: 3.4 g/dL — ABNORMAL LOW (ref 3.5–5.0)
Alkaline Phosphatase: 46 U/L (ref 38–126)
Anion gap: 12 (ref 5–15)
BUN: 9 mg/dL (ref 6–20)
CO2: 36 mmol/L — ABNORMAL HIGH (ref 22–32)
Calcium: 9.3 mg/dL (ref 8.9–10.3)
Chloride: 86 mmol/L — ABNORMAL LOW (ref 98–111)
Creatinine, Ser: 0.73 mg/dL (ref 0.61–1.24)
GFR, Estimated: >60 mL/min (ref >60)
Glucose, Bld: 165 mg/dL — ABNORMAL HIGH (ref 70–99)
Potassium: 3.3 mmol/L — ABNORMAL LOW (ref 3.5–5.1)
Sodium: 134 mmol/L — ABNORMAL LOW (ref 135–145)
Total Bilirubin: 0.8 mg/dL (ref 0.3–1.2)
Total Protein: 7.3 g/dL (ref 6.5–8.1)

## 2021-07-09 LAB — BODY FLUID CULTURE W GRAM STAIN: Culture: NO GROWTH

## 2021-07-09 LAB — CBC
HCT: 31.6 % — ABNORMAL LOW (ref 39.0–52.0)
Hemoglobin: 10.5 g/dL — ABNORMAL LOW (ref 13.0–17.0)
MCH: 26.8 pg (ref 26.0–34.0)
MCHC: 33.2 g/dL (ref 30.0–36.0)
MCV: 80.6 fL (ref 80.0–100.0)
Platelets: 330 10*3/uL (ref 150–400)
RBC: 3.92 MIL/uL — ABNORMAL LOW (ref 4.22–5.81)
RDW: 14.9 % (ref 11.5–15.5)
WBC: 13.4 10*3/uL — ABNORMAL HIGH (ref 4.0–10.5)
nRBC: 0 % (ref 0.0–0.2)

## 2021-07-09 LAB — COOXEMETRY PANEL
Carboxyhemoglobin: 2 % — ABNORMAL HIGH (ref 0.5–1.5)
Methemoglobin: <0.7 % (ref 0.0–1.5)
O2 Saturation: 62.8 %
Total hemoglobin: 10.9 g/dL — ABNORMAL LOW (ref 12.0–16.0)

## 2021-07-09 LAB — SURGICAL PATHOLOGY

## 2021-07-09 LAB — CK: Total CK: 465 U/L — ABNORMAL HIGH (ref 49–397)

## 2021-07-09 MED ORDER — SORBITOL 70 % SOLN
30.0000 mL | Freq: Once | Status: AC
  Administered 2021-07-09: 30 mL via ORAL
  Filled 2021-07-09: qty 30

## 2021-07-09 MED ORDER — METOPROLOL TARTRATE 25 MG PO TABS
25.0000 mg | ORAL_TABLET | Freq: Two times a day (BID) | ORAL | Status: DC
  Administered 2021-07-09: 25 mg via ORAL
  Filled 2021-07-09: qty 1

## 2021-07-09 MED ORDER — POTASSIUM CHLORIDE CRYS ER 10 MEQ PO TBCR: 20.0000 meq | EXTENDED_RELEASE_TABLET | Freq: Every day | ORAL | Status: DC

## 2021-07-09 MED ORDER — POTASSIUM CHLORIDE CRYS ER 20 MEQ PO TBCR
20.0000 meq | EXTENDED_RELEASE_TABLET | Freq: Two times a day (BID) | ORAL | Status: DC
  Administered 2021-07-09: 20 meq via ORAL
  Filled 2021-07-09 (×3): qty 1

## 2021-07-09 MED ORDER — POTASSIUM CHLORIDE 10 MEQ/50ML IV SOLN
10.0000 meq | INTRAVENOUS | Status: AC
  Administered 2021-07-09 (×3): 10 meq via INTRAVENOUS
  Filled 2021-07-09 (×3): qty 50

## 2021-07-09 MED ORDER — METOPROLOL TARTRATE 50 MG PO TABS
50.0000 mg | ORAL_TABLET | Freq: Two times a day (BID) | ORAL | Status: AC
  Administered 2021-07-09 – 2021-07-11 (×5): 50 mg via ORAL
  Filled 2021-07-09 (×5): qty 1

## 2021-07-09 MED ORDER — POTASSIUM CHLORIDE CRYS ER 20 MEQ PO TBCR
20.0000 meq | EXTENDED_RELEASE_TABLET | ORAL | Status: DC
  Administered 2021-07-09: 20 meq via ORAL
  Filled 2021-07-09 (×2): qty 1

## 2021-07-09 MED ORDER — ENOXAPARIN SODIUM 40 MG/0.4ML IJ SOSY
40.0000 mg | PREFILLED_SYRINGE | Freq: Every day | INTRAMUSCULAR | Status: DC
  Administered 2021-07-09 – 2021-07-11 (×3): 40 mg via SUBCUTANEOUS
  Filled 2021-07-09 (×3): qty 0.4

## 2021-07-09 MED ORDER — ADULT MULTIVITAMIN W/MINERALS CH
1.0000 | ORAL_TABLET | Freq: Every day | ORAL | Status: DC
  Administered 2021-07-09 – 2021-07-12 (×4): 1 via ORAL
  Filled 2021-07-09 (×4): qty 1

## 2021-07-09 NOTE — Evaluation (Signed)
Occupational Therapy Evaluation Patient Details Name: Martin Stafford MRN: [id number] DOB: 19-Oct-1990 Today's Date: 07/09/2021   History of Present Illness 31 y.o. M who presents 07/05/2021 with nonradiating left sided chest pain, chills, sore throat. 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 S/p excision of pilonidal cyst and placement of wound VAC. 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   Clinical Impression   PTA, pt was living with his fiance and two children and was independent. Currently, pt requires Min Guard A for ADLs and functional mobility. Provided education and handout on sternal precautions, grooming, UB ADLs, LB ADLs, and toileting; pt demonstrated understanding. Pt would benefit from further acute OT to facilitate practice of dressing to carry over sternal precautions. Recommend dc home once medically stable per physician.    Recommendations for follow up therapy are one component of a multi-disciplinary discharge planning process, led by the attending physician.  Recommendations may be updated based on patient status, additional functional criteria and insurance authorization.   Follow Up Recommendations  No OT follow up    Assistance Recommended at Discharge PRN  Patient can return home with the following      Functional Status Assessment     Equipment Recommendations  None recommended by OT    Recommendations for Other Services       Precautions / Restrictions Precautions Precautions: Fall;Other (comment);Sternal Precaution Comments: wound vac on sacral area Restrictions Other Position/Activity Restrictions: sternal precautions      Mobility Bed Mobility Overal bed mobility: Needs Assistance Bed Mobility: Sidelying to Sit, Rolling Rolling: Min guard Sidelying to sit: Min guard, HOB elevated       General bed mobility comments: semi log roll with HOb elevated.    Transfers Overall  transfer level: Needs assistance Equipment used: None Transfers: Sit to/from Stand Sit to Stand: Min guard           General transfer comment: Min Guard A for safety. Pt demonstrating good adherance to sternal precautions      Balance Overall balance assessment: Mild deficits observed, not formally tested                                         ADL either performed or assessed with clinical judgement   ADL Overall ADL's : Needs assistance/impaired Eating/Feeding: Set up;Sitting   Grooming: Oral care;Min guard;Standing   Upper Body Bathing: Set up;Sitting Upper Body Bathing Details (indicate cue type and reason): educating on sternal precautions and compensatory techniques Lower Body Bathing: Min guard;Sit to/from stand Lower Body Bathing Details (indicate cue type and reason): educating on sternal precautions and compensatory techniques Upper Body Dressing : Set up;Sitting Upper Body Dressing Details (indicate cue type and reason): educating on sternal precautions and compensatory techniques Lower Body Dressing: Min guard;Sit to/from stand Lower Body Dressing Details (indicate cue type and reason): educating on sternal precautions and compensatory techniques Toilet Transfer: Min guard;Ambulation;Regular Museum/gallery exhibitions officer and Hygiene: Min guard;Sit to/from stand       Functional mobility during ADLs: Min guard General ADL Comments: Providing education on sternal precautions for ADLs and functional transfers     Vision         Perception     Praxis      Pertinent Vitals/Pain Pain Assessment Pain Assessment: Faces Faces Pain Scale: Hurts a little bit Pain Descriptors /  Indicators: Guarding Pain Intervention(s): Monitored during session, Limited activity within patient's tolerance, Repositioned     Hand Dominance Right   Extremity/Trunk Assessment Upper Extremity Assessment Upper Extremity Assessment: Overall WFL for  tasks assessed   Lower Extremity Assessment Lower Extremity Assessment: Defer to PT evaluation   Cervical / Trunk Assessment Cervical / Trunk Assessment: Normal   Communication Communication Communication: Other (comment) (soft spoken)   Cognition Arousal/Alertness: Awake/alert Behavior During Therapy: Flat affect Overall Cognitive Status: Within Functional Limits for tasks assessed                                       General Comments  HR 120s. 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, does involve lifting          OT Problem List: Decreased strength;Decreased range of motion;Decreased activity tolerance;Decreased knowledge of use of DME or AE;Decreased knowledge of precautions      OT Treatment/Interventions: Self-care/ADL training;Therapeutic exercise;Energy conservation;DME and/or AE instruction;Therapeutic activities;Patient/family education    OT Goals(Current goals can be found in the care plan section) Acute Rehab OT Goals Patient Stated Goal: Go home OT Goal Formulation: With patient Time For Goal Achievement: 07/23/21 Potential to Achieve Goals: Good  OT Frequency: Min 2X/week    Co-evaluation              AM-PAC OT "6 Clicks" Daily Activity     Outcome Measure Help from another person eating meals?: None Help from another person taking care of personal  grooming?: None Help from another person toileting, which includes using toliet, bedpan, or urinal?: A Little Help from another person bathing (including washing, rinsing, drying)?: A Little Help from another person to put on and taking off regular upper body clothing?: None Help from another person to put on and taking off regular lower body clothing?: A Little 6 Click Score: 21   End of Session Nurse Communication: Mobility status  Activity Tolerance: Patient tolerated treatment well Patient left: in chair;with call bell/phone within reach  OT Visit Diagnosis: Unsteadiness on feet (R26.81);Other abnormalities of gait and mobility (R26.89);Muscle weakness (generalized) (M62.81)                Time: [id number] OT Time Calculation (min): 30 min Charges:  OT General Charges $OT Visit: 1 Visit OT Evaluation $OT Eval Moderate Complexity: 1 Mod OT Treatments $Self Care/Home Management : 8-22 mins  Lia Vigilante MSOT, OTR/L Acute Rehab Pager: [phone] Office: [place] 07/09/2021, 10:25 AM

## 2021-07-09 NOTE — Progress Notes (Signed)
Initial Nutrition Assessment  DOCUMENTATION CODES:   Not applicable  INTERVENTION:   Continue Boost Plus po TID, each supplement provides 360 kcal and 14 grams of protein  Advance diet to Regular today; discussed with Attending   Add MVI with Minerals   NUTRITION DIAGNOSIS:   Increased nutrient needs related to wound healing, post-op healing as evidenced by estimated needs.  GOAL:   Patient will meet greater than or equal to 90% of their needs  MONITOR:   PO intake, Supplement acceptance, Weight trends, Labs, Skin  REASON FOR ASSESSMENT:   Consult Assessment of nutrition requirement/status  ASSESSMENT:   31 yo male admitted with chest pain with aortic valve endocarditis requiring valve replacement; pt also with pilonidal cysts requiring excision. PMH includes skin cysts  2/16 Admitted 2/22 Excision of pilonidal cyst, placement of graft matrix and wound vac 2/24 TEE, aortic root replacement for subvalvular abscess and severe aortic insufficiency  Tolerating liquid diet, nausea improved, +Flatus, no BM. Pt on bowel regimen which includes dulcolax, colace, reglan. Received sorbitol x 1 this AM  Limited documentation of po intake. One meal documented as 100%  Minimal output from wound VAC  Weight appears stable per weight encounters  Labs: sodium 134, (L), potassium 3.3 (L) Meds: colace, lasix, reglan, dulcolax  Diet Order:   Diet Order             Diet full liquid Room service appropriate? Yes; Fluid consistency: Thin  Diet effective now                   EDUCATION NEEDS:   Not appropriate for education at this time  Skin:  Skin Assessment: Skin Integrity Issues: Skin Integrity Issues:: Wound VAC Wound Vac: pilonidal cyst s/p extraction, graft placement  Last BM:  2/23, +flatus, on bowel regimen  Height:   Ht Readings from Last 1 Encounters:  07/05/21 5\' 11"  (1.803 m)    Weight:   Wt Readings from Last 1 Encounters:  07/09/21 105.8 kg      BMI:  Body mass index is 32.53 kg/m.  Estimated Nutritional Needs:   Kcal:  2300-2500 kcals  Protein:  115-130 g  Fluid:  >/= 2 L   07/11/21 MS, RDN, LDN, CNSC Registered Dietitian III Clinical Nutrition RD Pager and On-Call Pager Number Located in [place]

## 2021-07-09 NOTE — Progress Notes (Signed)
PHARMACY CONSULT NOTE FOR:  OUTPATIENT  PARENTERAL ANTIBIOTIC THERAPY (OPAT)  Indication: Culture negative AV Endocarditis Regimen: Daptomycin 900 mg IV every 24 hours and Rocephin 2g IV every 24 hours End date: 08/15/21  IV antibiotic discharge orders are pended. To discharging provider:  please sign these orders via discharge navigator,  Select New Orders & click on the button choice - Manage This Unsigned Work.    Thank you for allowing pharmacy to be a part of this patients care.  Georgina Pillion, PharmD, BCPS Infectious Diseases Clinical Pharmacist 07/09/2021 12:48 PM   **Pharmacist phone directory can now be found on [email or web address] ([email or web address]).  Listed under [facility].

## 2021-07-09 NOTE — Progress Notes (Signed)
° °   °  [address]       [place] [address]             [phone]      POD # 4 homograft root replacement  BP 110/79    Pulse (!) 101    Temp 98.9 F (37.2 C) (Oral)    Resp 14    Ht 5\' 11"  (1.803 m)    Wt 105.8 kg    SpO2 90%    BMI 32.53 kg/m  2L  93% sat  Intake/Output Summary (Last 24 hours) at 07/09/2021 1842 Last data filed at 07/09/2021 1200 Gross per 24 hour  Intake 542.72 ml  Output 2115 ml  Net -1572.28 ml   No PM labs  Remo Lipps C. Roxan Hockey, MD [facility] [phone]

## 2021-07-09 NOTE — Progress Notes (Signed)
Progress Note  Patient Name: Martin Stafford Date of Encounter: 07/09/2021  [facility] Cardiologist: Sanda Klein, MD   Subjective   No acute events overnight. Feeling better today. Pain well controlled. Using the bathroom.  Inpatient Medications    Scheduled Meds:  acetaminophen  1,000 mg Oral Q6H   Or   acetaminophen (TYLENOL) oral liquid 160 mg/5 mL  1,000 mg Per Tube Q6H   aspirin EC  81 mg Oral Daily   bisacodyl  10 mg Oral Daily   Or   bisacodyl  10 mg Rectal Daily   Chlorhexidine Gluconate Cloth  6 each Topical Daily   docusate sodium  200 mg Oral Daily   enoxaparin (LOVENOX) injection  40 mg Subcutaneous QHS   furosemide  40 mg Intravenous Daily   lactose free nutrition  237 mL Oral TID WC   mouth rinse  15 mL Mouth Rinse BID   metoCLOPramide (REGLAN) injection  10 mg Intravenous Q6H   metoprolol tartrate  25 mg Oral BID   pantoprazole  40 mg Oral Daily   potassium chloride  20 mEq Oral BID   sodium chloride flush  10-40 mL Intracatheter Q12H   Continuous Infusions:  cefTRIAXone (ROCEPHIN)  IV Stopped (07/08/21 1754)   DAPTOmycin (CUBICIN)  IV Stopped (07/08/21 2053)   potassium chloride     PRN Meds: metoprolol tartrate, morphine injection, ondansetron (ZOFRAN) IV, oxyCODONE, sodium chloride flush, traMADol   Vital Signs    Vitals:   07/09/21 0600 07/09/21 0700 07/09/21 0800 07/09/21 0900  BP: 127/77 (!) 150/115 128/73 126/84  Pulse: (!) 110 (!) 118 (!) 104 99  Resp: 19 (!) 23 13 16   Temp:      TempSrc:      SpO2: 94% 91% 96% 96%  Weight: 105.8 kg     Height:        Intake/Output Summary (Last 24 hours) at 07/09/2021 1024 Last data filed at 07/09/2021 0800 Gross per 24 hour  Intake 1088.31 ml  Output 2190 ml  Net -1101.69 ml   Last 3 Weights 07/09/2021 07/08/2021 07/07/2021  Weight (lbs) 233 lb 4 oz 254 lb 13.6 oz 256 lb 6.3 oz  Weight (kg) 105.8 kg 115.6 kg 116.3 kg      Telemetry    NSR/Sinus tachycardia - Personally  Reviewed  ECG    No new since 07/06/21 - Personally Reviewed  Physical Exam   GEN: Well nourished, well developed in no acute distress NECK: No JVD CARDIAC: regular rhythm, normal S1 and S2, no rubs or gallops. No murmur. VASCULAR: Radial pulses 2+ bilaterally.  RESPIRATORY:  Clear to auscultation without rales, wheezing or rhonchi  ABDOMEN: Soft, non-tender, non-distended MUSCULOSKELETAL:  Moves all 4 limbs independently SKIN: Warm and dry, no edema NEUROLOGIC:  No focal neuro deficits noted. PSYCHIATRIC:  Normal affect    Labs    High Sensitivity Troponin:   Recent Labs  Lab 06/24/21 0903 06/27/21 0547 06/27/21 0740 06/27/21 1237 06/27/21 1549  TROPONINIHS 4 122* 140* 114* 109*     Chemistry Recent Labs  Lab 07/05/21 2100 07/05/21 2159 07/06/21 0330 07/06/21 1012 07/06/21 1700 07/07/21 0403 07/08/21 0526 07/08/21 1622 07/09/21 0332  NA 136   < > 137   < > 137   < > 135 133* 134*  K 4.7   < > 4.7   < > 3.8   < > 3.5 3.6 3.3*  CL 105  --  106  --  104   < >  89* 84* 86*  CO2 23  --  24  --  23   < > 34* 36* 36*  GLUCOSE 161*  --  143*  --  196*   < > 114* 133* 165*  BUN 12  --  12  --  17   < > 10 12 9   CREATININE 0.72  --  0.79  --  0.72   < > 0.81 0.63 0.73  CALCIUM 8.0*  --  8.4*  --  8.5*   < > 9.2 9.4 9.3  MG 2.9*  --  2.7*  --  2.3  --   --   --   --   PROT  --   --   --   --   --   --  6.9  --  7.3  ALBUMIN  --   --   --   --   --   --  3.2*  --  3.4*  AST  --   --   --   --   --   --  83*  --  54*  ALT  --   --   --   --   --   --  90*  --  70*  ALKPHOS  --   --   --   --   --   --  44  --  46  BILITOT  --   --   --   --   --   --  0.9  --  0.8  GFRNONAA >60  --  >60  --  >60   < > >60 >60 >60  ANIONGAP 8  --  7  --  10   < > 12 13 12    < > = values in this interval not displayed.    Lipids No results for input(s): CHOL, TRIG, HDL, LABVLDL, LDLCALC, CHOLHDL in the last 168 hours.  Hematology Recent Labs  Lab 07/07/21 0403 07/08/21 0406  07/09/21 0332  WBC 10.1 12.6* 13.4*  RBC 2.91* 3.66* 3.92*  HGB 7.7* 9.9* 10.5*  HCT 23.5* 29.8* 31.6*  MCV 80.8 81.4 80.6  MCH 26.5 27.0 26.8  MCHC 32.8 33.2 33.2  RDW 14.5 14.8 14.9  PLT 193 272 330   Thyroid  No results for input(s): TSH, FREET4 in the last 168 hours.   BNPNo results for input(s): BNP, PROBNP in the last 168 hours.  DDimer No results for input(s): DDIMER in the last 168 hours.   Radiology    DG Chest Port 1 View  Result Date: 07/08/2021 CLINICAL DATA:  Post AVR, sore chest EXAM: PORTABLE CHEST 1 VIEW COMPARISON:  Portable exam at 0515 hrs compared to 07/07/2021 FINDINGS: RIGHT jugular line tip projects over SVC. Enlargement of cardiac silhouette and pulmonary vascular congestion. Linear subsegmental atelectasis at RIGHT base. Atelectasis versus consolidation LEFT lower lobe. No pleural effusion or pneumothorax. Gaseous distention of stomach. IMPRESSION: Atelectasis versus consolidation LEFT lower lobe. Electronically Signed   By: Lavonia Dana M.D.   On: 07/08/2021 08:28   [place] EKG SITE RITE  Result Date: 07/07/2021 If [facility] image not attached, placement could not be confirmed due to current cardiac rhythm.   Cardiac Studies   Echocardiogram 06/27/2021    1. The aortic valve leafets are not well visualized but the valve appears  tricuspid with no significant calcification/thickening. There is very  eccentric, severe aortic regurgitation with diffuse color flow seen in the  LVOT. There is  diastolic flow  reversal noted in the abdominal aorta. No distinct vegetations visualized.  No dissection flap seen but minimal visualization of the ascending aorta.  Recommend TEE +/- CTA for further evaluation.   2. Left ventricular ejection fraction, by estimation, is 55 to 60%. The  left ventricle has normal function. The left ventricle has no regional  wall motion abnormalities. There is moderate concentric left ventricular  hypertrophy. Left ventricular  diastolic  parameters are consistent with Grade I diastolic dysfunction  (impaired relaxation).   3. Right ventricular systolic function is normal. The right ventricular  size is normal.   4. Left atrial size was mildly dilated.   5. A small pericardial effusion is present. The pericardial effusion is  circumferential.   6. The mitral valve is normal in structure. Trivial mitral valve  regurgitation.   7. The inferior vena cava is dilated in size with <50% respiratory  variability, suggesting right atrial pressure of 15 mmHg.   8. Aortic dilatation noted. There is mild dilatation of the aortic root,  measuring 39 mm.    TEE 07/01/21 IMPRESSIONS   1. Left ventricular ejection fraction, by estimation, is 60 to 65%. The  left ventricle has normal function. There is moderate left ventricular  hypertrophy.   2. Right ventricular systolic function is normal. The right ventricular  size is normal.   3. No left atrial/left atrial appendage thrombus was detected.   4. A small pericardial effusion is present.   5. The mitral valve is normal in structure. Trivial mitral valve  regurgitation.   6. The noncoronary cusp of the aortic valve is abnormally thickened. The  basal portion has an almost fenestrated appearance, concerning for abscess  formation. Aortic regurgitation is eccentric and severe with backflow from  descending aorta.. The aortic  valve is tricuspid.   CT coronary 07/02/21 IMPRESSION: 1. Coronary calcium score of 0.  2. Normal coronary origin with right dominance.  3. Normal coronary arteries.  4. Mobile density on the [place] consistent with vegetation.  5. Thickening at the base of the [place] consistent with vegetation.  6. Pseudoaneurysm of the LVOT under the [place] consistent with aortic root/LVOT abscess.  7. Diverticulum of the basal septum.  8. Mid LAD myocardial bridge (normal variant).  Patient Profile     30 y.o. 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 Prescott Gum) -POD 4 -tissue cultures NGTD, blood cultures NGTD -antibiotics per ID, has PICC line in place, currently on daptomycin and ceftriaxone, monitoring CK -continue aspirin 81 mg daily given valve -post op progression per CT surgery, lines removed, off milrinone, tubes out -admission weight 109.5 kg, weight today 105.8 kg from 115.6 kg yesterday, suspect bed weights inaccurate -blood pressure controlled, heart rate sinus rhythm/sinus tach as expected post op. On metoprolol 25 mg BID  For questions or updates, please contact [place] Please consult [email or web address] for contact info under     Signed, Buford Dresser, MD  07/09/2021, 10:24 AM

## 2021-07-09 NOTE — Progress Notes (Signed)
Progress Note  4 Days Post-Op  Subjective: VAC unable to hold seal. Some bloody drainage present under drape but once cleaned off incision is clean and without swelling. Discussed wound care with RN and patient. Sutures to remain 2-3 weeks. Patient reports mild pain with sitting but is padding chair.   Objective: Vital signs in last 24 hours: Temp:  [98.2 F (36.8 C)-99.5 F (37.5 C)] 99.5 F (37.5 C) (02/27 2100) Pulse Rate:  [95-118] 99 (02/28 0900) Resp:  [12-27] 16 (02/28 0900) BP: (123-150)/(67-115) 126/84 (02/28 0900) SpO2:  [88 %-97 %] 96 % (02/28 0900) Weight:  [105.8 kg] 105.8 kg (02/28 0600) Last BM Date : 07/04/21  Intake/Output from previous day: 02/27 0701 - 02/28 0700 In: 1096.5 [P.O.:240; I.V.:106.3; IV Piggyback:750.2] Out: 9563 [Urine:3550; Drains:40] Intake/Output this shift: Total I/O In: 68.2 [P.O.:60; I.V.:8.2] Out: 250 [Urine:250]  PE: General: pleasant, WD, WN male who is in NAD Lungs: Respiratory effort nonlabored Abd: soft, NT, ND GU: sutures in place along gluteal cleft without erythema or edema, mild ttp appropriate  Psych: A&Ox3 with an appropriate affect.    Lab Results:  Recent Labs    07/08/21 0406 07/09/21 0332  WBC 12.6* 13.4*  HGB 9.9* 10.5*  HCT 29.8* 31.6*  PLT 272 330   BMET Recent Labs    07/08/21 1622 07/09/21 0332  NA 133* 134*  K 3.6 3.3*  CL 84* 86*  CO2 36* 36*  GLUCOSE 133* 165*  BUN 12 9  CREATININE 0.63 0.73  CALCIUM 9.4 9.3   PT/INR No results for input(s): LABPROT, INR in the last 72 hours. CMP     Component Value Date/Time   NA 134 (L) 07/09/2021 0332   K 3.3 (L) 07/09/2021 0332   CL 86 (L) 07/09/2021 0332   CO2 36 (H) 07/09/2021 0332   GLUCOSE 165 (H) 07/09/2021 0332   BUN 9 07/09/2021 0332   CREATININE 0.73 07/09/2021 0332   CALCIUM 9.3 07/09/2021 0332   PROT 7.3 07/09/2021 0332   ALBUMIN 3.4 (L) 07/09/2021 0332   AST 54 (H) 07/09/2021 0332   ALT 70 (H) 07/09/2021 0332   ALKPHOS 46  07/09/2021 0332   BILITOT 0.8 07/09/2021 0332   GFRNONAA >60 07/09/2021 0332   Lipase  No results found for: LIPASE     Studies/Results: DG Chest Port 1 View  Result Date: 07/08/2021 CLINICAL DATA:  Post AVR, sore chest EXAM: PORTABLE CHEST 1 VIEW COMPARISON:  Portable exam at 0515 hrs compared to 07/07/2021 FINDINGS: RIGHT jugular line tip projects over SVC. Enlargement of cardiac silhouette and pulmonary vascular congestion. Linear subsegmental atelectasis at RIGHT base. Atelectasis versus consolidation LEFT lower lobe. No pleural effusion or pneumothorax. Gaseous distention of stomach. IMPRESSION: Atelectasis versus consolidation LEFT lower lobe. Electronically Signed   By: Lavonia Dana M.D.   On: 07/08/2021 08:28   [place] EKG SITE RITE  Result Date: 07/07/2021 If [facility] image not attached, placement could not be confirmed due to current cardiac rhythm.   Anti-infectives: Anti-infectives (From admission, onward)    Start     Dose/Rate Route Frequency Ordered Stop   07/08/21 2000  DAPTOmycin (CUBICIN) 900 mg in sodium chloride 0.9 % IVPB        10 mg/kg  91.4 kg (Adjusted) 136 mL/hr over 30 Minutes Intravenous Daily 07/08/21 1135     07/05/21 2300  vancomycin (VANCOCIN) IVPB 1000 mg/200 mL premix  Status:  Discontinued        1,000 mg 200 mL/hr over  60 Minutes Intravenous  Once 07/05/21 1627 07/05/21 1658   07/05/21 2000  vancomycin (VANCOREADY) IVPB 1500 mg/300 mL  Status:  Discontinued        1,500 mg 150 mL/hr over 120 Minutes Intravenous Every 12 hours 07/05/21 1658 07/08/21 1125   07/05/21 1800  cefTRIAXone (ROCEPHIN) 2 g in sodium chloride 0.9 % 100 mL IVPB        2 g 200 mL/hr over 30 Minutes Intravenous Every 24 hours 07/05/21 1658     07/05/21 1700  ceFAZolin (ANCEF) IVPB 2g/100 mL premix  Status:  Discontinued        2 g 200 mL/hr over 30 Minutes Intravenous Every 8 hours 07/05/21 1627 07/05/21 1658   07/05/21 0400  vancomycin (VANCOREADY) IVPB 1500 mg/300 mL         1,500 mg 150 mL/hr over 120 Minutes Intravenous To Surgery 07/04/21 1008 07/05/21 0945   07/05/21 0400  ceFAZolin (ANCEF) IVPB 2g/100 mL premix        2 g 200 mL/hr over 30 Minutes Intravenous To Surgery 07/04/21 1008 07/05/21 1516   07/05/21 0400  ceFAZolin (ANCEF) IVPB 2g/100 mL premix  Status:  Discontinued        2 g 200 mL/hr over 30 Minutes Intravenous To Surgery 07/04/21 1008 07/05/21 1627   07/03/21 2100  vancomycin (VANCOREADY) IVPB 1500 mg/300 mL  Status:  Discontinued        1,500 mg 150 mL/hr over 120 Minutes Intravenous Every 12 hours 07/03/21 1433 07/05/21 1627   07/03/21 1200  vancomycin (VANCOREADY) IVPB 1500 mg/300 mL  Status:  Discontinued        1,500 mg 150 mL/hr over 120 Minutes Intravenous Every 12 hours 07/03/21 1116 07/03/21 1140   07/03/21 0800  vancomycin (VANCOREADY) IVPB 1500 mg/300 mL        1,500 mg 150 mL/hr over 120 Minutes Intravenous Every 12 hours 07/03/21 0742 07/03/21 1102   07/02/21 2000  vancomycin (VANCOREADY) IVPB 1500 mg/300 mL        1,500 mg 150 mL/hr over 120 Minutes Intravenous Every 12 hours 07/02/21 1417 07/02/21 2212   06/28/21 2000  vancomycin (VANCOREADY) IVPB 1500 mg/300 mL  Status:  Discontinued        1,500 mg 150 mL/hr over 120 Minutes Intravenous Every 12 hours 06/28/21 0722 07/02/21 1417   06/28/21 1800  cefTRIAXone (ROCEPHIN) 2 g in sodium chloride 0.9 % 100 mL IVPB  Status:  Discontinued        2 g 200 mL/hr over 30 Minutes Intravenous Every 24 hours 06/28/21 1419 07/05/21 1627   06/28/21 1600  ceFEPIme (MAXIPIME) 2 g in sodium chloride 0.9 % 100 mL IVPB  Status:  Discontinued        2 g 200 mL/hr over 30 Minutes Intravenous Every 8 hours 06/28/21 0722 06/28/21 1419   06/28/21 0815  vancomycin (VANCOREADY) IVPB 2000 mg/400 mL        2,000 mg 200 mL/hr over 120 Minutes Intravenous  Once 06/28/21 0715 06/28/21 1032   06/28/21 0815  ceFEPIme (MAXIPIME) 2 g in sodium chloride 0.9 % 100 mL IVPB        2 g 200 mL/hr over 30  Minutes Intravenous  Once 06/28/21 0715 06/28/21 1117        Assessment/Plan Chronic pilonidal sinus tract S/P excision, placement of myriad graft, VAC 07/03/21 Dr. Ninfa Linden - POD#6 - VAC leaking so removed at bedside today - incision is C/D/I with sutures present - ok to shower  from general surgery standpoint when cleared by cardiothoracic - dry dressing to incision BID or PRN if soiled - recommend sitz/shower after bowel movements - follow up in AVS for wound check and suture removal - general surgery will sign off, please call if we can be of further assistance       LOS: 12 days   I reviewed last 24 h vitals and pain scores, last 48 h intake and output, and last 24 h labs and trends.  This care required low level of medical decision making.    Norm Parcel, [facility] Surgery 07/09/2021, 12:23 PM Please see Amion for pager number during day hours 7:00am-4:30pm

## 2021-07-09 NOTE — Progress Notes (Addendum)
[facility] DAILY ICU PROGRESS NOTE                   [address]            [place] [address]          [phone]   4 Days Post-Op Procedure(s) (LRB): Homograft aortic root replacement using 58mm Cryo Aortic Valve. (N/A) TRANSESOPHAGEAL ECHOCARDIOGRAM (TEE) (N/A)  Total Length of Stay:  LOS: 12 days   Subjective: Awake and alert.  No new complaints or concerns.  Walked 2 laps in the unit this morning.  Nausea improved and tolerating clear liquids.  Passing gas, no BM yet.  Milrinone at 0.125 mcg/kg/min, CoOx 62  Objective: Vital signs in last 24 hours: Temp:  [98.2 F (36.8 C)-99.5 F (37.5 C)] 99.5 F (37.5 C) (02/27 2100) Pulse Rate:  [91-112] 110 (02/28 0600) Cardiac Rhythm: Normal sinus rhythm;Sinus tachycardia (02/28 0400) Resp:  [12-27] 19 (02/28 0600) BP: (123-149)/(67-85) 127/77 (02/28 0600) SpO2:  [88 %-97 %] 94 % (02/28 0600) Weight:  [105.8 kg] 105.8 kg (02/28 0600)  Filed Weights   07/07/21 0630 07/08/21 0500 07/09/21 0600  Weight: 116.3 kg 115.6 kg 105.8 kg    Weight change: -9.8 kg   Hemodynamic parameters for last 24 hours: CVP:  [24 mmHg] 24 mmHg  Intake/Output from previous day: 02/27 0701 - 02/28 0700 In: 1096.5 [P.O.:240; I.V.:106.3; IV Piggyback:750.2] Out: 3590 [Urine:3550; Drains:40]  Intake/Output this shift: No intake/output data recorded.  Current Meds: Scheduled Meds:  acetaminophen  1,000 mg Oral Q6H   Or   acetaminophen (TYLENOL) oral liquid 160 mg/5 mL  1,000 mg Per Tube Q6H   aspirin EC  81 mg Oral Daily   bisacodyl  10 mg Oral Daily   Or   bisacodyl  10 mg Rectal Daily   Chlorhexidine Gluconate Cloth  6 each Topical Daily   docusate sodium  200 mg Oral Daily   furosemide  40 mg Intravenous Daily   insulin aspart  0-24 Units Subcutaneous TID WC   lactose free nutrition  237 mL Oral TID WC   mouth rinse  15 mL Mouth Rinse BID   metoCLOPramide (REGLAN) injection  10 mg Intravenous Q6H   metoprolol tartrate   12.5 mg Oral BID   pantoprazole  40 mg Oral Daily   [START ON 07/10/2021] potassium chloride  20 mEq Oral Daily   potassium chloride  20 mEq Oral Q4H   sodium chloride flush  10-40 mL Intracatheter Q12H   Continuous Infusions:  cefTRIAXone (ROCEPHIN)  IV Stopped (07/08/21 1754)   DAPTOmycin (CUBICIN)  IV Stopped (07/08/21 2053)   milrinone 0.125 mcg/kg/min (07/09/21 0600)   PRN Meds:.metoprolol tartrate, morphine injection, ondansetron (ZOFRAN) IV, oxyCODONE, sodium chloride flush, traMADol  General appearance: alert, cooperative, and no distress Neurologic: intact Heart: SR / ST 100-120/min Lungs: brath sounds clear/ diminished.  Abdomen: soft, NT, few bowel sounds.  Extremities: minimal peripheral edema.  Wound: the sternotomy incision is sell approximated and dry. The negative pressure dressing is applied to the sacral wound, 26ml drainage recorded for past 24 hours.    Lab Results: CBC: Recent Labs    07/08/21 0406 07/09/21 0332  WBC 12.6* 13.4*  HGB 9.9* 10.5*  HCT 29.8* 31.6*  PLT 272 330   BMET:  Recent Labs    07/08/21 1622 07/09/21 0332  NA 133* 134*  K 3.6 3.3*  CL 84* 86*  CO2 36* 36*  GLUCOSE 133* 165*  BUN 12  9  CREATININE 0.63 0.73  CALCIUM 9.4 9.3    CMET: Lab Results  Component Value Date   WBC 13.4 (H) 07/09/2021   HGB 10.5 (L) 07/09/2021   HCT 31.6 (L) 07/09/2021   PLT 330 07/09/2021   GLUCOSE 165 (H) 07/09/2021   ALT 70 (H) 07/09/2021   AST 54 (H) 07/09/2021   NA 134 (L) 07/09/2021   K 3.3 (L) 07/09/2021   CL 86 (L) 07/09/2021   CREATININE 0.73 07/09/2021   BUN 9 07/09/2021   CO2 36 (H) 07/09/2021   TSH 1.260 07/02/2021   INR 1.6 (H) 07/05/2021   HGBA1C 5.3 07/04/2021      PT/INR: No results for input(s): LABPROT, INR in the last 72 hours. Radiology: No results found.   Assessment/Plan: S/P Procedure(s) (LRB): Homograft aortic root replacement using 68mm Cryo Aortic Valve. (N/A) TRANSESOPHAGEAL ECHOCARDIOGRAM (TEE)  (N/A)  -POD4 aortic root replacement for subvalvular abscess and severe aortic insufficiency. Operative cultures remain negative. On Rocephin and daptomycin. Stable hemodynamics, plan to stop the milrinone today.     -Volume excess- good response to diuresis, Wt below pre-op if accurate.   -PULM- mild resp insufficiency related to volume excess. Improving. Continue working on mobility and pulmonary hygiene. Wean O2.  -GI- Tolerating liquids, passing gas. OK to advance diet. Sorbitol x 1 dose today.   -POD-6 excision of pilonidal cyst- Mgt per general surgery. Negative pressure dressing to stay in place until tomorrow.   -Hypokalemia- supplementing with oral KCl.   -Mild expected acute blood loss anemia- stable, monitor.   Leary Roca, PA-C [phone] 07/09/2021 8:00 AM  Patient making good progress now ambulating We will check coox in a.m. off milrinone Continue diuresis, IV antibiotics for endocarditis, remove epicardial wires postop day 5.  patient examined and medical record reviewed,agree with above note. Lovett Sox 07/09/2021

## 2021-07-09 NOTE — Progress Notes (Signed)
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  Interval History: s/p aortic root replacement POD #4 No rash  Physical Exam: Constitutional:  Vitals:   07/09/21 0800 07/09/21 0900  BP: 128/73 126/84  Pulse: (!) 104 99  Resp: 13 16  Temp:    SpO2: 96% 96%  Up in chair, no acute distress Respiratory: normal respiratory effort Cardiovascular: RRR   Review of Systems: Constitutional: negative for fever Gastrointestinal: negative for nausea and diarrhea  Lab Results  Component Value Date   WBC 13.4 (H) 07/09/2021   HGB 10.5 (L) 07/09/2021   HCT 31.6 (L) 07/09/2021   MCV 80.6 07/09/2021   PLT 330 07/09/2021    Lab Results  Component Value Date   CREATININE 0.73 07/09/2021   BUN 9 07/09/2021   NA 134 (L) 07/09/2021   K 3.3 (L) 07/09/2021   CL 86 (L) 07/09/2021   CO2 36 (H) 07/09/2021    Lab Results  Component Value Date   ALT 70 (H) 07/09/2021   AST 54 (H) 07/09/2021   ALKPHOS 46 07/09/2021     Microbiology: Recent Results (from the past 240 hour(s))  Surgical pcr screen     Status: None   Collection Time: 07/01/21  9:22 PM   Specimen: Nasal Mucosa; Nasal Swab  Result Value Ref Range Status   MRSA, PCR NEGATIVE NEGATIVE Final   Staphylococcus aureus NEGATIVE NEGATIVE Final    Comment: (NOTE) The Xpert SA Assay (FDA approved for NASAL specimens in patients 13 years of age and older), is one component of a comprehensive surveillance program. It is not intended to diagnose infection nor to guide or monitor treatment. Performed at [facility], [place] [address], [place], [place] [address]   Urine Culture     Status: None   Collection Time: 07/01/21 11:15 PM   Specimen: Urine, Clean Catch  Result Value Ref Range Status   Specimen Description URINE, CLEAN CATCH  Final   Special Requests SET UP ON GRAY TOP URINE  Final   Culture   Final    NO GROWTH Performed at [facility], [place]  [address] [place], [place] [address]    Report Status 07/03/2021 FINAL  Final  Aerobic Culture w Gram Stain (superficial specimen)     Status: None   Collection Time: 07/02/21  1:25 PM   Specimen: Wound  Result Value Ref Range Status   Specimen Description WOUND  Final   Special Requests PILONDIAL CYST/ SINUS  Final   Gram Stain   Final    RARE WBC PRESENT,BOTH PMN AND MONONUCLEAR RARE GRAM NEGATIVE RODS    Culture   Final    NO GROWTH 2 DAYS Performed at [facility], [place] [address] [place], [place] [address]    Report Status 07/04/2021 FINAL  Final  Anaerobic culture w Gram Stain     Status: None (Preliminary result)   Collection Time: 07/05/21 10:08 AM   Specimen: PATH Cytology Misc. fluid; Body Fluid  Result Value Ref Range Status   Specimen Description FLUID  Final   Special Requests NONE  Final   Gram Stain   Final    NO WBC SEEN NO ORGANISMS SEEN Performed at [facility], [address], [place], [place] [address]    Culture   Final    NO ANAEROBES ISOLATED; CULTURE IN PROGRESS FOR 5 DAYS   Report Status PENDING  Incomplete  Body fluid culture w Gram Stain     Status: None   Collection Time: 07/05/21 10:08 AM   Specimen: PATH Cytology Misc. fluid; Body Fluid  Result Value Ref Range Status   Specimen Description FLUID  Final   Special Requests NONE  Final   Gram Stain   Final    RARE WBC PRESENT, PREDOMINANTLY MONONUCLEAR NO ORGANISMS SEEN    Culture   Final    NO GROWTH Performed at [facility], [address], [place], [place] [address]    Report Status 07/09/2021 FINAL  Final  Aerobic/Anaerobic Culture w Gram Stain (surgical/deep wound)     Status: None (Preliminary result)   Collection Time: 07/05/21  1:30 PM   Specimen: Aortic Valve Leaflets; Tissue  Result Value Ref Range Status   Specimen Description TISSUE  Final   Special Requests   Final    AORTIC VALVE LEAFLETS  S/O FOR MICRO RNA ANALYSIS TO [facility]   Gram Stain   Final    NO SQUAMOUS  EPITHELIAL CELLS SEEN RARE WBC SEEN NO ORGANISMS SEEN    Culture   Final    NO GROWTH 4 DAYS NO ANAEROBES ISOLATED; CULTURE IN PROGRESS FOR 5 DAYS Performed at [facility], [place] [address] [place], [place] [address]    Report Status PENDING  Incomplete    Impression/Plan:  1. Culture negative endocarditis - no positive findings to date and samples referred to [facility] for PCR testing.  On daptomycin and ceftriaxone.  Will continue with this for 6 weeks post op pending any identification of an organism.    2. Medication monitoring - CK with some elevation as anticipated with recent surgery.  Will trend to be sure it is stable on daptomycin.    3.  OPAT - antibiotics as above.  Will place OPAT order for discharge planning.    Diagnosis: Endocarditis, s/p aortic valve replacement  Culture Result: negative; sent to [facility]  Allergies  Allergen Reactions   Eggs Or Egg-Derived Products     "eggs"   Peanut-Containing Drug Products     "peanuts"    OPAT Orders Discharge antibiotics to be given via PICC line Discharge antibiotics: daptomcyin 900 mg IV daily and ceftriaxone 2 grams IV daily Per pharmacy protocol yes Duration: 6 weeks End Date: August 15, 2021  [place] Per Protocol: yes  Home health RN for IV administration and teaching; PICC line care and labs.    Labs weekly while on IV antibiotics: _x_ CBC with differential __ BMP _x_ CMP __ CRP __ ESR __ Vancomycin trough _x_ CK  _x_ Please pull PIC at completion of IV antibiotics __ Please leave PIC in place until doctor has seen patient or been notified  Fax weekly labs to [phone]  Clinic Follow Up Appt:  Will schedule

## 2021-07-09 NOTE — TOC Initial Note (Signed)
Transition of Care [facility]) - Initial/Assessment Note    Patient Details  Name: Martin Stafford MRN: [id number] Date of Birth: May 25, 1990  Transition [facility] CM/SW Contact:    Gala Lewandowsky, RN Phone Number: 07/09/2021, 4:41 PM  Clinical Narrative: [facility]ne Rice is following the patient for IV antibiotic therapy in the home. Pam Liaison for [facility]ne Rice will provide education in the hospital prior to home. Case Manager called the significant other to discuss Home Health Agency. Referral was submitted to [facility] and family is agreeable. Frances Furbish can accept the patient for [facility] RN- will need HH orders and F2F. Case Manager will continue to follow for additional disposition needs as the patient progresses.               Expected Discharge Plan: Home w Home Health Services Barriers to Discharge: No Barriers Identified   Patient Goals and CMS Choice Patient states their goals for this hospitalization and ongoing recovery are:: to return home   Choice offered to / list presented to : Spouse, Patient  Expected Discharge Plan and Services Expected Discharge Plan: Home w Home Health Services In-house Referral: NA Discharge Planning Services: CM Consult Post Acute Care Choice: Home Health Living arrangements for the past 2 months: Apartment                 DME Arranged: IV pump/equipment ([employer])   Date DME Agency Contacted: 07/09/21 Time DME Agency Contacted: [phone] Representative spoke with at DME Agency: Pam HH Arranged: RN, Disease Management HH Agency: [facility] Date [facility] Agency Contacted: 07/09/21 Time HH Agency Contacted: [phone] Representative spoke with at [facility] Agency: Kandee Keen  Prior Living Arrangements/Services Living arrangements for the past 2 months: Apartment Lives with:: Significant Other, Self Patient language and need for interpreter reviewed:: Yes Do you feel safe going back to the place where you live?: Yes      Need for Family Participation in Patient  Care: Yes (Comment) Care giver support system in place?: Yes (comment)   Criminal Activity/Legal Involvement Pertinent to Current Situation/Hospitalization: No - Comment as needed  Activities of Daily Living Home Assistive Devices/Equipment: None ADL Screening (condition at time of admission) Patient's cognitive ability adequate to safely complete daily activities?: Yes Is the patient deaf or have difficulty hearing?: No Does the patient have difficulty seeing, even when wearing glasses/contacts?: No Does the patient have difficulty concentrating, remembering, or making decisions?: No Patient able to express need for assistance with ADLs?: No Does the patient have difficulty dressing or bathing?: No Independently performs ADLs?: Yes (appropriate for developmental age) Does the patient have difficulty walking or climbing stairs?: No Weakness of Legs: None Weakness of Arms/Hands: None  Permission Sought/Granted Permission sought to share information with : Facility Medical sales representative, Family Supports Permission granted to share information with : Yes, Verbal Permission Granted     Permission granted to share info w AGENCY: [facility]        Emotional Assessment Appearance:: Appears stated age Attitude/Demeanor/Rapport: Engaged Affect (typically observed): Appropriate Orientation: : Oriented to Situation, Oriented to  Time, Oriented to Place, Oriented to Self Alcohol / Substance Use: Not Applicable Psych Involvement: No (comment)  Admission diagnosis:  Chills [R68.83] Elevated troponin [R77.8] Chest pain [R07.9] Hx of repair of aortic root [Z98.890] Patient Active Problem List   Diagnosis Date Noted   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   Encounter for preoperative dental examination    Caries    Accretions on teeth  Impacted third molar tooth    Aortic valve abscess    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   Sepsis (HCC) 06/28/2021   Severe aortic  insufficiency    Fever    Chest pain 06/27/2021   Chills    Elevated troponin    Abnormal heart sounds    Skin ulcer of buttock (HCC) 12/12/2011   Hidradenitis 02/12/2011   PCP:  Patient, No Pcp Per (Inactive) Pharmacy:   [id number] [place], [place] - [address] AT [facility] [address] [place] [address] Phone: [phone] Fax: [phone]   Readmission Risk Interventions No flowsheet data found.

## 2021-07-10 ENCOUNTER — Inpatient Hospital Stay (HOSPITAL_COMMUNITY): Payer: BC Managed Care – PPO

## 2021-07-10 DIAGNOSIS — R079 Chest pain, unspecified: Secondary | ICD-10-CM | POA: Diagnosis not present

## 2021-07-10 DIAGNOSIS — Z452 Encounter for adjustment and management of vascular access device: Secondary | ICD-10-CM | POA: Diagnosis not present

## 2021-07-10 DIAGNOSIS — I358 Other nonrheumatic aortic valve disorders: Secondary | ICD-10-CM | POA: Diagnosis not present

## 2021-07-10 DIAGNOSIS — I517 Cardiomegaly: Secondary | ICD-10-CM | POA: Diagnosis not present

## 2021-07-10 LAB — CBC
HCT: 33.5 % — ABNORMAL LOW (ref 39.0–52.0)
Hemoglobin: 11 g/dL — ABNORMAL LOW (ref 13.0–17.0)
MCH: 26.6 pg (ref 26.0–34.0)
MCHC: 32.8 g/dL (ref 30.0–36.0)
MCV: 81.1 fL (ref 80.0–100.0)
Platelets: 376 10*3/uL (ref 150–400)
RBC: 4.13 MIL/uL — ABNORMAL LOW (ref 4.22–5.81)
RDW: 15.1 % (ref 11.5–15.5)
WBC: 14.2 10*3/uL — ABNORMAL HIGH (ref 4.0–10.5)
nRBC: 0 % (ref 0.0–0.2)

## 2021-07-10 LAB — COOXEMETRY PANEL
Carboxyhemoglobin: 0.6 % (ref 0.5–1.5)
Methemoglobin: 2.2 % — ABNORMAL HIGH (ref 0.0–1.5)
O2 Saturation: 74.1 %
Total hemoglobin: 11.5 g/dL — ABNORMAL LOW (ref 12.0–16.0)

## 2021-07-10 LAB — AEROBIC/ANAEROBIC CULTURE W GRAM STAIN (SURGICAL/DEEP WOUND)
Culture: NO GROWTH
Gram Stain: NONE SEEN

## 2021-07-10 LAB — COMPREHENSIVE METABOLIC PANEL
ALT: 55 U/L — ABNORMAL HIGH (ref 0–44)
AST: 41 U/L (ref 15–41)
Albumin: 3.3 g/dL — ABNORMAL LOW (ref 3.5–5.0)
Alkaline Phosphatase: 53 U/L (ref 38–126)
Anion gap: 10 (ref 5–15)
BUN: 15 mg/dL (ref 6–20)
CO2: 34 mmol/L — ABNORMAL HIGH (ref 22–32)
Calcium: 9.2 mg/dL (ref 8.9–10.3)
Chloride: 91 mmol/L — ABNORMAL LOW (ref 98–111)
Creatinine, Ser: 0.75 mg/dL (ref 0.61–1.24)
GFR, Estimated: >60 mL/min (ref >60)
Glucose, Bld: 111 mg/dL — ABNORMAL HIGH (ref 70–99)
Potassium: 3.2 mmol/L — ABNORMAL LOW (ref 3.5–5.1)
Sodium: 135 mmol/L (ref 135–145)
Total Bilirubin: 0.6 mg/dL (ref 0.3–1.2)
Total Protein: 7.2 g/dL (ref 6.5–8.1)

## 2021-07-10 MED ORDER — LOSARTAN POTASSIUM 25 MG PO TABS
25.0000 mg | ORAL_TABLET | Freq: Every day | ORAL | Status: DC
  Administered 2021-07-10 – 2021-07-12 (×3): 25 mg via ORAL
  Filled 2021-07-10 (×3): qty 1

## 2021-07-10 MED ORDER — POTASSIUM CHLORIDE CRYS ER 20 MEQ PO TBCR
20.0000 meq | EXTENDED_RELEASE_TABLET | Freq: Two times a day (BID) | ORAL | Status: DC
  Administered 2021-07-11 – 2021-07-12 (×3): 20 meq via ORAL
  Filled 2021-07-10 (×3): qty 1

## 2021-07-10 MED ORDER — POTASSIUM CHLORIDE CRYS ER 20 MEQ PO TBCR
20.0000 meq | EXTENDED_RELEASE_TABLET | ORAL | Status: AC
  Administered 2021-07-10 (×3): 20 meq via ORAL
  Filled 2021-07-10 (×2): qty 1

## 2021-07-10 MED ORDER — FUROSEMIDE 40 MG PO TABS
40.0000 mg | ORAL_TABLET | Freq: Every day | ORAL | Status: DC
  Administered 2021-07-10 – 2021-07-12 (×3): 40 mg via ORAL
  Filled 2021-07-10 (×3): qty 1

## 2021-07-10 NOTE — Progress Notes (Signed)
0700 - Report received from PM RN.  All questions answered.  Safety checks performed.  All lines verified. ?Hand hygiene performed before/after each pt contact. ?[place], [place] rounded. ?0800 - Assessment.  Pacer wires D/C'd, per order.  No complications.  Cx incision painted with Betadine.   ?[place], OT worked with pt and advised they were going to sign off. ?6 - Dr. Linus Salmons, ID rounded on pt. ?1120 - Report given to [facility], Therapist, sports.  All questions answered. ?

## 2021-07-10 NOTE — Progress Notes (Addendum)
TCTS DAILY ICU PROGRESS NOTE ? ?                 [address] ?           [name] [address] ?         [phone]  ? ?5 Days Post-Op ?Procedure(s) (LRB): ?Homograft aortic root replacement using 39mm Cryo Aortic Valve. (N/A) ?TRANSESOPHAGEAL ECHOCARDIOGRAM (TEE) (N/A) ? ?Total Length of Stay:  LOS: 13 days  ? ?Subjective: ?Awake and alert.  Says he had a good day yesterday. Walked in the unit. BM x 2. Pain controlled with oral oxycodone. No new complaints or concerns.   ? ?Milrinone off x 24 hours, CoOx 74 ? ?Objective: ?Vital signs in last 24 hours: ?Temp:  [98.8 ?F (37.1 ?C)-98.9 ?F (37.2 ?C)] 98.8 ?F (37.1 ?C) (02/28 2000) ?Pulse Rate:  [89-123] 93 (03/01 0700) ?Cardiac Rhythm: Normal sinus rhythm;Sinus tachycardia (03/01 0400) ?Resp:  [11-25] 12 (03/01 0700) ?BP: (89-153)/(57-91) 133/65 (03/01 0700) ?SpO2:  [90 %-100 %] 98 % (03/01 0700) ?Weight:  [103.9 kg] 103.9 kg (03/01 0500) ? ?Filed Weights  ? 07/08/21 0500 07/09/21 0600 07/10/21 0500  ?Weight: 115.6 kg 105.8 kg 103.9 kg  ? ? ?Weight change: -1.9 kg  ? ?Hemodynamic parameters for last 24 hours: ?  ? ?Intake/Output from previous day: ?02/28 0701 - 03/01 0700 ?In: 425.6 [P.O.:180; I.V.:8.9; IV Piggyback:236.8] ?Out: 2925 [Urine:2925] ? ?Intake/Output this shift: ?No intake/output data recorded. ? ?Current Meds: ?Scheduled Meds: ? acetaminophen  1,000 mg Oral Q6H  ? Or  ? acetaminophen (TYLENOL) oral liquid 160 mg/5 mL  1,000 mg Per Tube Q6H  ? aspirin EC  81 mg Oral Daily  ? bisacodyl  10 mg Oral Daily  ? Or  ? bisacodyl  10 mg Rectal Daily  ? Chlorhexidine Gluconate Cloth  6 each Topical Daily  ? docusate sodium  200 mg Oral Daily  ? enoxaparin (LOVENOX) injection  40 mg Subcutaneous QHS  ? furosemide  40 mg Oral Daily  ? lactose free nutrition  237 mL Oral TID WC  ? losartan  25 mg Oral Daily  ? mouth rinse  15 mL Mouth Rinse BID  ? metoCLOPramide (REGLAN) injection  10 mg Intravenous Q6H  ? metoprolol tartrate  50 mg Oral BID  ?  multivitamin with minerals  1 tablet Oral Daily  ? pantoprazole  40 mg Oral Daily  ? potassium chloride  20 mEq Oral Q4H  ? [START ON 07/11/2021] potassium chloride  20 mEq Oral BID  ? sodium chloride flush  10-40 mL Intracatheter Q12H  ? ?Continuous Infusions: ? cefTRIAXone (ROCEPHIN)  IV 2 g (07/09/21 1655)  ? DAPTOmycin (CUBICIN)  IV Stopped (07/09/21 2109)  ? ?PRN Meds:.metoprolol tartrate, ondansetron (ZOFRAN) IV, oxyCODONE, sodium chloride flush, traMADol ? ?General appearance: alert, cooperative, and no distress ?Neurologic: intact ?Heart: SR / ST 100-110/min ?Lungs: breath sounds clear  ?Abdomen: soft, NT  ?Extremities: no peripheral edema. PICC insertion site RUE clean and dry.  ?Wound: the sternotomy incision is well approximated and dry. The negative pressure dressing to the sacral wound was removed yesterday. (Site OK per general surgery) ? ?Lab Results: ?CBC: ?Recent Labs  ?  07/09/21 ?0332 07/10/21 ?0402  ?WBC 13.4* 14.2*  ?HGB 10.5* 11.0*  ?HCT 31.6* 33.5*  ?PLT 330 376  ? ? ?BMET:  ?Recent Labs  ?  07/09/21 ?0332 07/10/21 ?0402  ?NA 134* 135  ?K 3.3* 3.2*  ?CL 86* 91*  ?CO2 36* 34*  ?  GLUCOSE 165* 111*  ?BUN 9 15  ?CREATININE 0.73 0.75  ?CALCIUM 9.3 9.2  ? ?  ?CMET: ?Lab Results  ?Component Value Date  ? WBC 14.2 (H) 07/10/2021  ? HGB 11.0 (L) 07/10/2021  ? HCT 33.5 (L) 07/10/2021  ? PLT 376 07/10/2021  ? GLUCOSE 111 (H) 07/10/2021  ? ALT 55 (H) 07/10/2021  ? AST 41 07/10/2021  ? NA 135 07/10/2021  ? K 3.2 (L) 07/10/2021  ? CL 91 (L) 07/10/2021  ? CREATININE 0.75 07/10/2021  ? BUN 15 07/10/2021  ? CO2 34 (H) 07/10/2021  ? TSH 1.260 07/02/2021  ? INR 1.6 (H) 07/05/2021  ? HGBA1C 5.3 07/04/2021  ? ? ? ? ?PT/INR: No results for input(s): LABPROT, INR in the last 72 hours. ?Radiology: No results found. ? ? ?Assessment/Plan: ?S/P Procedure(s) (LRB): ?Homograft aortic root replacement using 65mm Cryo Aortic Valve. (N/A) ?TRANSESOPHAGEAL ECHOCARDIOGRAM (TEE) (N/A) ? ?-POD5 aortic root replacement for  subvalvular abscess and severe aortic insufficiency. Operative cultures remain negative. On Rocephin and daptomycin through April 4 per ID recommendations. Stable hemodynamics off milrinone. Plan to d/c the pacer wires and transfer to 4E today.     ? ?-HTN- BP 140-150's past several hours on metoprolol 50mg  BID, adding losartan 25mg  daily. ? ?-Volume excess- 2.5L diuresis yesterday, appears euvolemic. Converting to oral Lasix today ? ?-PULM- mild resp insufficiency improving. Continue working on mobility and pulmonary hygiene. Weaning O2. CXR stable. ? ?-GI- tolerating diet, bowel function returned.  ? ?-POD-7 excision of pilonidal cyst- Mgt per general surgery. Negative pressure dressing removed yesterday. Sitz bath after BMs recommended by GS.  ? ?-Hypokalemia- supplementing with oral KCl. Monitor. ? ?-Mild expected acute blood loss anemia- stable, monitor.  ? ?Antony Odea, PA-C ?[phone] ?07/10/2021 7:48 AM ? ?Continue metoprolol and add low dose losartan for BP, HR ?Transfer to 4E ?patient examined and medical record reviewed,agree with above note. ?Dahlia Byes ?07/10/2021 ? ?

## 2021-07-10 NOTE — Progress Notes (Signed)
Occupational Therapy Treatment ?Patient Details ?Name: Martin Stafford ?MRN: [id number] ?DOB: March 03, 1991 ?Today's Date: 07/10/2021 ? ? ?History of present illness 31 y.o. M who presents 07/05/2021 with nonradiating left sided chest pain, chills, sore throat. 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 S/p excision of pilonidal cyst and placement of wound VAC. 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 ?  ?OT comments ? All education is complete and patient indicates understanding.pt with good return demo of sternal precautions with adls and stable VSS. Pt tolerates RA 94-97% this session. OT will sign-off at this time.   ? ?Recommendations for follow up therapy are one component of a multi-disciplinary discharge planning process, led by the attending physician.  Recommendations may be updated based on patient status, additional functional criteria and insurance authorization. ?   ?Follow Up Recommendations ? No OT follow up  ?  ?Assistance Recommended at Discharge PRN  ?Patient can return home with the following ?   ?  ?Equipment Recommendations ? None recommended by OT  ?  ?Recommendations for Other Services   ? ?  ?Precautions / Restrictions Precautions ?Precautions: Fall;Sternal ?Precaution Comments: no wound vac at this time ?Restrictions ?Weight Bearing Restrictions: Yes ?Other Position/Activity Restrictions: sternal precautions  ? ? ?  ? ?Mobility Bed Mobility ?Overal bed mobility: Modified Independent ?  ?  ?  ?  ?  ?  ?General bed mobility comments: hugging pillow exits R side ?  ? ?Transfers ?Overall transfer level: Modified independent ?  ?  ?  ?  ?  ?  ?  ?  ?  ?  ?  ?Balance Overall balance assessment: No apparent balance deficits (not formally assessed) ?  ?  ?  ?  ?  ?  ?  ?  ?  ?  ?  ?  ?  ?  ?  ?  ?  ?  ?   ? ?ADL either performed or assessed with clinical judgement  ? ?ADL Overall ADL's : Modified independent ?  ?  ?  ?  ?  ?  ?  ?  ?  ?  ?   ?  ?  ?  ?  ?  ?  ?  ?  ?General ADL Comments: pt is able to figure 4 cross for LB dressing. NO ae needed. pt able to demo standard toilet transfer with good hand position. pt demonstrates sink level grooming task ?  ? ?Extremity/Trunk Assessment Upper Extremity Assessment ?Upper Extremity Assessment: Overall WFL for tasks assessed ?  ?Lower Extremity Assessment ?Lower Extremity Assessment: Defer to PT evaluation ?  ?  ?  ? ?Vision   ?  ?  ?Perception   ?  ?Praxis   ?  ? ?Cognition Arousal/Alertness: Awake/alert ?Behavior During Therapy: Flat affect ?Overall Cognitive Status: Within Functional Limits for tasks assessed ?  ?  ?  ?  ?  ?  ?  ?  ?  ?  ?  ?  ?  ?  ?  ?  ?General Comments: able to recall sternal precautions and demo iwth activity ?  ?  ?   ?Exercises   ? ?  ?Shoulder Instructions   ? ? ?  ?General Comments HR 109 [id number] RA so [place] 2L dc with RN aware  ? ? ?Pertinent Vitals/ Pain       Pain Assessment ?Pain Assessment: No/denies pain ? ?Home Living   ?  ?  ?  ?  ?  ?  ?  ?  ?  ?  ?  ?  ?  ?  ?  ?  ?  ?  ? ?  ?  Prior Functioning/Environment    ?  ?  ?  ?   ? ?Frequency ? Min 2X/week  ? ? ? ? ?  ?Progress Toward Goals ? ?OT Goals(current goals can now be found in the care plan section) ? Progress towards OT goals: Goals met/education completed, patient discharged from OT ? ?Acute Rehab OT Goals ?Patient Stated Goal: asking questions about sternal healing time being shorter than 3 months? ?OT Goal Formulation: With patient ?Time For Goal Achievement: 07/23/21 ?Potential to Achieve Goals: Good ?ADL Goals ?Pt Will Perform Upper Body Dressing:  (met) ?Pt Will Perform Lower Body Dressing:  (met) ?Pt Will Perform Toileting - Clothing Manipulation and hygiene:  (met)  ?Plan Discharge plan remains appropriate;All goals met and education completed, patient discharged from OT services   ? ?Co-evaluation ? ? ?   ?  ?  ?  ?  ? ?  ?AM-PAC OT "6 Clicks" Daily Activity     ?Outcome Measure ? ? Help from another person  eating meals?: None ?Help from another person taking care of personal grooming?: None ?Help from another person toileting, which includes using toliet, bedpan, or urinal?: None ?Help from another person bathing (including washing, rinsing, drying)?: None ?Help from another person to put on and taking off regular upper body clothing?: None ?Help from another person to put on and taking off regular lower body clothing?: None ?6 Click Score: 24 ? ?  ?End of Session Equipment Utilized During Treatment: Oxygen ? ?OT Visit Diagnosis: Unsteadiness on feet (R26.81);Other abnormalities of gait and mobility (R26.89);Muscle weakness (generalized) (M62.81) ?  ?Activity Tolerance Patient tolerated treatment well ?  ?Patient Left in chair;with call bell/phone within reach ?  ?Nurse Communication Mobility status;Precautions ?  ? ?   ? ?Time: 5747-3403 ?OT Time Calculation (min): 33 min ? ?Charges: OT General Charges ?$OT Visit: 1 Visit ?OT Treatments ?$Self Care/Home Management : 23-37 mins ? ? ?Brynn, OTR/L  ?Acute Rehabilitation Services ?Pager: [phone] ?Office: [phone] ?. ? ? ?Jeri Modena ?07/10/2021, 9:53 AM ?

## 2021-07-10 NOTE — Progress Notes (Signed)
Pt transported to 4E09 with all belongings. RN in room to receive patient. ? ?Versie Starks, RN ? ?

## 2021-07-10 NOTE — Progress Notes (Signed)
?  [facility] ? ? 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 ?Interval History: s/p aortic root replacement POD #5 ?No rash ? ?Physical Exam: ?Constitutional:  ?Vitals:  ? 07/10/21 0800 07/10/21 0808  ?BP: (!) 135/92   ?Pulse:    ?Resp: 18   ?Temp:  98.8 ?F (37.1 ?C)  ?SpO2: 97%   ?Up in chair, nad ?Respiratory: normal respiratory effort ?Cardiovascular: RRR ? ? ?Review of Systems: ?Constitutional: negative for fever ?Gastrointestinal: negative for nausea and diarrhea ? ?Lab Results  ?Component Value Date  ? WBC 14.2 (H) 07/10/2021  ? HGB 11.0 (L) 07/10/2021  ? HCT 33.5 (L) 07/10/2021  ? MCV 81.1 07/10/2021  ? PLT 376 07/10/2021  ?  ?Lab Results  ?Component Value Date  ? CREATININE 0.75 07/10/2021  ? BUN 15 07/10/2021  ? NA 135 07/10/2021  ? K 3.2 (L) 07/10/2021  ? CL 91 (L) 07/10/2021  ? CO2 34 (H) 07/10/2021  ?  ?Lab Results  ?Component Value Date  ? ALT 55 (H) 07/10/2021  ? AST 41 07/10/2021  ? ALKPHOS 53 07/10/2021  ?  ? ?Microbiology: ?Recent Results (from the past 240 hour(s))  ?Surgical pcr screen     Status: None  ? Collection Time: 07/01/21  9:22 PM  ? Specimen: Nasal Mucosa; Nasal Swab  ?Result Value Ref Range Status  ? MRSA, PCR NEGATIVE NEGATIVE Final  ? Staphylococcus aureus NEGATIVE NEGATIVE Final  ?  Comment: (NOTE) ?The Xpert SA Assay (FDA approved for NASAL specimens in patients 39 ?years of age and older), is one component of a comprehensive ?surveillance program. It is not intended to diagnose infection nor to ?guide or monitor treatment. ?Performed at [facility], [place] [address] [place], [place] ?[address] ?  ?Urine Culture     Status: None  ? Collection Time: 07/01/21 11:15 PM  ? Specimen: Urine, Clean Catch  ?Result Value Ref Range Status  ? Specimen Description URINE, CLEAN CATCH  Final  ? Special Requests SET UP ON GRAY TOP URINE  Final  ? Culture   Final  ?  NO GROWTH ?Performed at [facility], [place] [address] [place],  [place] [address] ?  ? Report Status 07/03/2021 FINAL  Final  ?Aerobic Culture w Gram Stain (superficial specimen)     Status: None  ? Collection Time: 07/02/21  1:25 PM  ? Specimen: Wound  ?Result Value Ref Range Status  ? Specimen Description WOUND  Final  ? Special Requests PILONDIAL CYST/ SINUS  Final  ? Gram Stain   Final  ?  RARE WBC PRESENT,BOTH PMN AND MONONUCLEAR ?RARE GRAM NEGATIVE RODS ?  ? Culture   Final  ?  NO GROWTH 2 DAYS ?Performed at [facility], [place] [address], [place], [place] [address] ?  ? Report Status 07/04/2021 FINAL  Final  ?Anaerobic culture w Gram Stain     Status: None (Preliminary result)  ? Collection Time: 07/05/21 10:08 AM  ? Specimen: PATH Cytology Misc. fluid; Body Fluid  ?Result Value Ref Range Status  ? Specimen Description FLUID  Final  ? Special Requests NONE  Final  ? Gram Stain   Final  ?  NO WBC SEEN ?NO ORGANISMS SEEN ?Performed at [facility], [place] [address], [place], [place] [address] ?  ? Culture   Final  ?  NO ANAEROBES ISOLATED; CULTURE IN PROGRESS FOR 5 DAYS  ? Report Status PENDING  Incomplete  ?  Body fluid culture w Gram Stain     Status: None  ? Collection Time: 07/05/21 10:08 AM  ? Specimen: PATH Cytology Misc. fluid; Body Fluid  ?Result Value Ref Range Status  ? Specimen Description FLUID  Final  ? Special Requests NONE  Final  ? Gram Stain   Final  ?  RARE WBC PRESENT, PREDOMINANTLY MONONUCLEAR ?NO ORGANISMS SEEN ?  ? Culture   Final  ?  NO GROWTH ?Performed at [facility], [place] [address] [place], [place] [address] ?  ? Report Status 07/09/2021 FINAL  Final  ?Aerobic/Anaerobic Culture w Gram Stain (surgical/deep wound)     Status: None (Preliminary result)  ? Collection Time: 07/05/21  1:30 PM  ? Specimen: Aortic Valve Leaflets; Tissue  ?Result Value Ref Range Status  ? Specimen Description TISSUE  Final  ? Special Requests   Final  ?  AORTIC VALVE LEAFLETS  S/O FOR MICRO RNA ANALYSIS TO [facility]  ? Gram Stain   Final  ?  NO SQUAMOUS EPITHELIAL CELLS  SEEN ?RARE WBC SEEN ?NO ORGANISMS SEEN ?  ? Culture   Final  ?  NO GROWTH 4 DAYS NO ANAEROBES ISOLATED; CULTURE IN PROGRESS FOR 5 DAYS ?Performed at [facility], [place] [address] [place], [place] [address] ?  ? Report Status PENDING  Incomplete  ? ? ?Impression/Plan:  ?1. Culture negative endocarditis - continuing with IV datpomycin and ceftriaxone as below.   ? ?2. Medication monitoring - will continue to monitor CK.   ? ?3.  OPAT - antibiotics as above.  Will place OPAT order for discharge planning.   ? ?-will sign off ? ?Diagnosis: ?Endocarditis, s/p aortic valve replacement ? ?Culture Result: negative; sent to [facility] ? ?Allergies  ?Allergen Reactions  ? Eggs Or Egg-Derived Products   ?  "eggs"  ? Peanut-Containing Drug Products   ?  "peanuts"  ? ? ?OPAT Orders ?Discharge antibiotics to be given via PICC line ?Discharge antibiotics: daptomcyin 900 mg IV daily and ceftriaxone 2 grams IV daily ?Per pharmacy protocol yes ?Duration: ?6 weeks ?End Date: ?August 15, 2021 ? ?[facility] Care Per Protocol: yes ? ?Home health RN for IV administration and teaching; PICC line care and labs.   ? ?Labs weekly while on IV antibiotics: ?_x_ CBC with differential ?__ BMP ?_x_ CMP ?__ CRP ?__ ESR ?__ Vancomycin trough ?_x_ CK ? ?_x_ Please pull PIC at completion of IV antibiotics ?__ Please leave PIC in place until doctor has seen patient or been notified ? ?Fax weekly labs to [phone] ? ?Clinic Follow Up Appt: ? ?07/30/21 at 4pm ? ? ? ?  ?

## 2021-07-10 NOTE — Progress Notes (Signed)
Mobility Specialist Progress Note ? ? 07/10/21 1547  ?Mobility  ?Activity Ambulated independently in hallway  ?Level of Assistance Standby assist, set-up cues, supervision of patient - no hands on  ?Assistive Device Front wheel walker  ?Distance Ambulated (ft) 940 ft  ?Activity Response Tolerated well  ?$Mobility charge 1 Mobility  ? ?Received pt in hall having no complaints and agreeable to mobility. Asymptomatic throughout ambulation, returned back to bed w/ call bell in reach and all needs met. ? ?Holland Falling ?Mobility Specialist ?Phone Number [phone] ? ?

## 2021-07-10 NOTE — Progress Notes (Signed)
Patient ID: Martin Stafford, male   DOB: Oct 08, 1990, 31 y.o.   MRN: [id number] ? ? ?Pilonidal incision stable today ?Expect drainage ?I plan on seeing him in the office on 3/10 to see if sutures can be removed ?

## 2021-07-10 NOTE — Progress Notes (Signed)
Patient brought to 4E from 2H. Telemetry box applied, CCMD notified. Patient states 0/10 pain scale.Patient oriented to room and staff. Call bell in reach. ? ?Kenard Gower, RN  ?

## 2021-07-10 NOTE — Progress Notes (Signed)
Physical Therapy Treatment ?Patient Details ?Name: Martin Stafford ?MRN: [id number] ?DOB: 09-09-1990 ?Today's Date: 07/10/2021 ? ? ?History of Present Illness 31 y.o. M who presents 07/05/2021 with nonradiating left sided chest pain, chills, sore throat. 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 S/p excision of pilonidal cyst and placement of wound VAC. 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 ? ?  ?PT Comments  ? ? Pt making excellent progress towards his physical therapy goals; reports pain well controlled with medication. Pt stating he just walked 2 laps around unit. Therefore, focused session on standing exercises for strengthening and endurance (see below). Written instruction provided. Do not anticipate need for PT follow up. ?   ?Recommendations for follow up therapy are one component of a multi-disciplinary discharge planning process, led by the attending physician.  Recommendations may be updated based on patient status, additional functional criteria and insurance authorization. ? ?Follow Up Recommendations ? No PT follow up ?  ?  ?Assistance Recommended at Discharge PRN  ?Patient can return home with the following A little help with walking and/or transfers;A little help with bathing/dressing/bathroom ?  ?Equipment Recommendations ? None recommended by PT  ?  ?Recommendations for Other Services   ? ? ?  ?Precautions / Restrictions Precautions ?Precautions: Fall;Sternal ?Precaution Comments: no wound vac at this time ?Restrictions ?Weight Bearing Restrictions: Yes ?Other Position/Activity Restrictions: sternal precautions  ?  ? ?Mobility ? Bed Mobility ?  ?  ?  ?  ?  ?  ?  ?General bed mobility comments: OOB in chair ?  ? ?Transfers ?Overall transfer level: Modified independent ?  ?  ?  ?  ?  ?  ?  ?  ?General transfer comment: Great technique, rocking forward to gain momentum, hands on knees, no physical assist to rise ?   ? ?Ambulation/Gait ?Ambulation/Gait assistance: Supervision ?Gait Distance (Feet): 3 Feet ?Assistive device: None ?Gait Pattern/deviations: Step-through pattern, Decreased stride length ?  ?  ?  ?  ? ? ?Stairs ?  ?  ?  ?  ?  ? ? ?Wheelchair Mobility ?  ? ?Modified Rankin (Stroke Patients Only) ?  ? ? ?  ?Balance Overall balance assessment: No apparent balance deficits (not formally assessed) ?  ?  ?  ?  ?  ?  ?  ?  ?  ?  ?  ?  ?  ?  ?  ?  ?  ?  ?  ? ?  ?Cognition Arousal/Alertness: Awake/alert ?Behavior During Therapy: Flat affect ?Overall Cognitive Status: Within Functional Limits for tasks assessed ?  ?  ?  ?  ?  ?  ?  ?  ?  ?  ?  ?  ?  ?  ?  ?  ?  ?  ?  ? ?  ?Exercises General Exercises - Lower Extremity ?Hip ABduction/ADduction: Both, 10 reps, Standing ?Heel Raises: Both, 10 reps, Standing ?Mini-Sqauts: Both, 10 reps, Standing ?Other Exercises ?Other Exercises: Standing: scapular retractions, shoulder shrugs, backward and forward shoulder rolls x 10 each ?Other Exercises: Standing: bilateral hamstring curls x 10 each ? ?  ?General Comments General comments (skin integrity, edema, etc.): HR 109 [id number] RA so [place] 2L dc with RN aware ?  ?  ? ?Pertinent Vitals/Pain Pain Assessment ?Pain Assessment: Faces ?Faces Pain Scale: Hurts a little bit ?Pain Location: incisional ?Pain Descriptors / Indicators: Guarding ?Pain Intervention(s): Monitored during session  ? ? ?Home Living   ?  ?  ?  ?  ?  ?  ?  ?  ?  ?   ?  ?  Prior Function    ?  ?  ?   ? ?PT Goals (current goals can now be found in the care plan section) Acute Rehab PT Goals ?Patient Stated Goal: feel better ?Potential to Achieve Goals: Good ?Progress towards PT goals: Progressing toward goals ? ?  ?Frequency ? ? ? Min 3X/week ? ? ? ?  ?PT Plan Current plan remains appropriate  ? ? ?Co-evaluation   ?  ?  ?  ?  ? ?  ?AM-PAC PT "6 Clicks" Mobility   ?Outcome Measure ? Help needed turning from your back to your side while in a flat bed without using bedrails?:  None ?Help needed moving from lying on your back to sitting on the side of a flat bed without using bedrails?: None ?Help needed moving to and from a bed to a chair (including a wheelchair)?: None ?Help needed standing up from a chair using your arms (e.g., wheelchair or bedside chair)?: None ?Help needed to walk in hospital room?: A Little ?Help needed climbing 3-5 steps with a railing? : A Little ?6 Click Score: 22 ? ?  ?End of Session   ?Activity Tolerance: Patient tolerated treatment well ?Patient left: in chair;with call bell/phone within reach;with family/visitor present ?Nurse Communication: Mobility status ?PT Visit Diagnosis: Unsteadiness on feet (R26.81);Difficulty in walking, not elsewhere classified (R26.2) ?  ? ? ?Time: [id number] ?PT Time Calculation (min) (ACUTE ONLY): 16 min ? ?Charges:  $Therapeutic Exercise: 8-22 mins          ?          ? ?Wyona Almas, PT, DPT ?Acute Rehabilitation Services ?Pager [phone] ?Office [phone] ? ? ? ?Deno Etienne ?07/10/2021, 12:34 PM ? ?

## 2021-07-11 ENCOUNTER — Telehealth: Payer: Self-pay

## 2021-07-11 DIAGNOSIS — Z952 Presence of prosthetic heart valve: Secondary | ICD-10-CM | POA: Diagnosis not present

## 2021-07-11 DIAGNOSIS — I358 Other nonrheumatic aortic valve disorders: Secondary | ICD-10-CM | POA: Diagnosis not present

## 2021-07-11 LAB — CK: Total CK: 262 U/L (ref 49–397)

## 2021-07-11 MED ORDER — METOPROLOL SUCCINATE ER 100 MG PO TB24
100.0000 mg | ORAL_TABLET | Freq: Every day | ORAL | Status: DC
  Administered 2021-07-12: 100 mg via ORAL
  Filled 2021-07-11: qty 1

## 2021-07-11 MED FILL — Lidocaine HCl Local Preservative Free (PF) Inj 2%: INTRAMUSCULAR | Qty: 15 | Status: AC

## 2021-07-11 MED FILL — Calcium Chloride Inj 10%: INTRAVENOUS | Qty: 10 | Status: AC

## 2021-07-11 MED FILL — Heparin Sodium (Porcine) Inj 1000 Unit/ML: INTRAMUSCULAR | Qty: 60 | Status: AC

## 2021-07-11 MED FILL — Albumin, Human Inj 5%: INTRAVENOUS | Qty: 250 | Status: AC

## 2021-07-11 MED FILL — Electrolyte-R (PH 7.4) Solution: INTRAVENOUS | Qty: 4000 | Status: AC

## 2021-07-11 MED FILL — Potassium Chloride Inj 2 mEq/ML: INTRAVENOUS | Qty: 20 | Status: AC

## 2021-07-11 MED FILL — Heparin Sodium (Porcine) Inj 1000 Unit/ML: INTRAMUSCULAR | Qty: 30 | Status: AC

## 2021-07-11 MED FILL — Sodium Bicarbonate IV Soln 8.4%: INTRAVENOUS | Qty: 50 | Status: AC

## 2021-07-11 MED FILL — Heparin Sodium (Porcine) Inj 1000 Unit/ML: Qty: 1000 | Status: AC

## 2021-07-11 MED FILL — Potassium Chloride Inj 2 mEq/ML: INTRAVENOUS | Qty: 40 | Status: AC

## 2021-07-11 MED FILL — Sodium Chloride IV Soln 0.9%: INTRAVENOUS | Qty: 4000 | Status: AC

## 2021-07-11 MED FILL — Mannitol IV Soln 20%: INTRAVENOUS | Qty: 500 | Status: AC

## 2021-07-11 NOTE — Telephone Encounter (Signed)
FMLA/STD forms completed and faxed to [facility] @ [phone]. ?Beginning LOA 06/27/21 through approx. 09/30/21 ?

## 2021-07-11 NOTE — Progress Notes (Signed)
Pharmacy Antibiotic Note ? ?Martin Stafford is a 31 y.o. male admitted on 06/27/2021 and found to have AV endocarditis now s/p aortic root replacement on 2/24. Vancomycin transitioned to daptomycin 2/27 in preparation for eventual OPAT, continue Rocephin as well. ? ?CK elevated at baseline given recent surgery (465) but trending down, now 262. ? ?Plan: ?- Continue Daptomycin 900 mg (~10 mg/kg AdjBW) every 24 hours ?- Continue Rocephin 2g IV every 24 hours ?- Watch CK weekly ?- Will continue to follow renal function, culture results, and LOT plans ? ?Height: 5\' 11"  (180.3 cm) ?Weight: 103.9 kg (229 lb 0.9 oz) ?IBW/kg (Calculated) : 75.3 ? ?Temp (24hrs), Avg:99.5 ?F (37.5 ?C), Min:98.3 ?F (36.8 ?C), Max:100.8 ?F (38.2 ?C) ? ?Recent Labs  ?Lab 07/06/21 ?1700 07/07/21 ?0403 07/07/21 ?1750 07/08/21 ?0406 07/08/21 ?07/10/21 07/08/21 ?1622 07/09/21 ?07/11/21 07/10/21 ?0402  ?WBC 8.6 10.1  --  12.6*  --   --  13.4* 14.2*  ?CREATININE 0.72 0.74 0.73  --  0.81 0.63 0.73 0.75  ? ?  ?   ? ?Allergies  ?Allergen Reactions  ? Eggs Or Egg-Derived Products   ?  "eggs"  ? Peanut-Containing Drug Products   ?  "peanuts"  ? ? ?Antimicrobials this admission: ?Cefepime 2/17 x 1 ?Ceftriaxone 2/17>> ?Vancomycin 2/17>> 2/27 ?Daptomycin 2/27 >> ? ? ?Microbiology results: ?2/16 BCx x 2 > negF ?2/17 BCx x 2 > negF ?2/24 Wound Cx (pilondial cyst) > negF ? ?Thank you for allowing pharmacy to be a part of this patient?s care. ? ?3/24, Pharm D, BCPS, BCCP ?Clinical Pharmacist ? 07/11/2021 11:07 AM  ? ?[facility] pharmacy phone numbers are listed on [email or web address] ? ? ? ?

## 2021-07-11 NOTE — Progress Notes (Signed)
Progress Note  Patient Name: Martin Stafford Date of Encounter: 07/11/2021  [facility] Cardiologist: Sanda Klein, MD   Subjective   Doing well today. Hoping to go home tomorrow. Reviewed medications, what to watch for, when to call the office.  Inpatient Medications    Scheduled Meds:  aspirin EC  81 mg Oral Daily   bisacodyl  10 mg Oral Daily   Or   bisacodyl  10 mg Rectal Daily   Chlorhexidine Gluconate Cloth  6 each Topical Daily   docusate sodium  200 mg Oral Daily   enoxaparin (LOVENOX) injection  40 mg Subcutaneous QHS   furosemide  40 mg Oral Daily   lactose free nutrition  237 mL Oral TID WC   losartan  25 mg Oral Daily   mouth rinse  15 mL Mouth Rinse BID   [START ON 07/12/2021] metoprolol succinate  100 mg Oral Daily   metoprolol tartrate  50 mg Oral BID   multivitamin with minerals  1 tablet Oral Daily   pantoprazole  40 mg Oral Daily   potassium chloride  20 mEq Oral BID   sodium chloride flush  10-40 mL Intracatheter Q12H   Continuous Infusions:  cefTRIAXone (ROCEPHIN)  IV 2 g (07/10/21 1836)   DAPTOmycin (CUBICIN)  IV 900 mg (07/10/21 2233)   PRN Meds: ondansetron (ZOFRAN) IV, oxyCODONE, sodium chloride flush, traMADol   Vital Signs    Vitals:   07/10/21 2015 07/10/21 2355 07/11/21 0320 07/11/21 0818  BP: 125/68 118/73 116/69 (!) 144/79  Pulse: 100 99 93 99  Resp: 15 18 17 18   Temp: 99.3 F (37.4 C) 99.9 F (37.7 C) (!) 100.8 F (38.2 C) 99.2 F (37.3 C)  TempSrc: Oral Oral Oral Oral  SpO2: 91% 95% 94% 96%  Weight:      Height:        Intake/Output Summary (Last 24 hours) at 07/11/2021 0958 Last data filed at 07/11/2021 0322 Gross per 24 hour  Intake 840 ml  Output 150 ml  Net 690 ml   Last 3 Weights 07/10/2021 07/09/2021 07/08/2021  Weight (lbs) 229 lb 0.9 oz 233 lb 4 oz 254 lb 13.6 oz  Weight (kg) 103.9 kg 105.8 kg 115.6 kg      Telemetry    NSR/Sinus tachycardia - Personally Reviewed  ECG    No new since 07/06/21 - Personally  Reviewed  Physical Exam   GEN: Well nourished, well developed in no acute distress NECK: No JVD CARDIAC: regular rhythm, normal S1 and S2, no rubs or gallops. No murmur. VASCULAR: Radial pulses 2+ bilaterally.  RESPIRATORY:  Clear to auscultation without rales, wheezing or rhonchi  ABDOMEN: Soft, non-tender, non-distended MUSCULOSKELETAL:  Moves all 4 limbs independently SKIN: Warm and dry, no edema NEUROLOGIC:  No focal neuro deficits noted. PSYCHIATRIC:  Normal affect    Labs    High Sensitivity Troponin:   Recent Labs  Lab 06/24/21 0903 06/27/21 0547 06/27/21 0740 06/27/21 1237 06/27/21 1549  TROPONINIHS 4 122* 140* 114* 109*     Chemistry Recent Labs  Lab 07/05/21 2100 07/05/21 2159 07/06/21 0330 07/06/21 1012 07/06/21 1700 07/07/21 0403 07/08/21 0526 07/08/21 1622 07/09/21 0332 07/10/21 0402  NA 136   < > 137   < > 137   < > 135 133* 134* 135  K 4.7   < > 4.7   < > 3.8   < > 3.5 3.6 3.3* 3.2*  CL 105  --  106  --  104   < >  89* 84* 86* 91*  CO2 23  --  24  --  23   < > 34* 36* 36* 34*  GLUCOSE 161*  --  143*  --  196*   < > 114* 133* 165* 111*  BUN 12  --  12  --  17   < > 10 12 9 15   CREATININE 0.72  --  0.79  --  0.72   < > 0.81 0.63 0.73 0.75  CALCIUM 8.0*  --  8.4*  --  8.5*   < > 9.2 9.4 9.3 9.2  MG 2.9*  --  2.7*  --  2.3  --   --   --   --   --   PROT  --   --   --   --   --   --  6.9  --  7.3 7.2  ALBUMIN  --   --   --   --   --   --  3.2*  --  3.4* 3.3*  AST  --   --   --   --   --   --  83*  --  54* 41  ALT  --   --   --   --   --   --  90*  --  70* 55*  ALKPHOS  --   --   --   --   --   --  44  --  46 53  BILITOT  --   --   --   --   --   --  0.9  --  0.8 0.6  GFRNONAA >60  --  >60  --  >60   < > >60 >60 >60 >60  ANIONGAP 8  --  7  --  10   < > 12 13 12 10    < > = values in this interval not displayed.    Lipids No results for input(s): CHOL, TRIG, HDL, LABVLDL, LDLCALC, CHOLHDL in the last 168 hours.  Hematology Recent Labs  Lab  07/08/21 0406 07/09/21 0332 07/10/21 0402  WBC 12.6* 13.4* 14.2*  RBC 3.66* 3.92* 4.13*  HGB 9.9* 10.5* 11.0*  HCT 29.8* 31.6* 33.5*  MCV 81.4 80.6 81.1  MCH 27.0 26.8 26.6  MCHC 33.2 33.2 32.8  RDW 14.8 14.9 15.1  PLT 272 330 376   Thyroid  No results for input(s): TSH, FREET4 in the last 168 hours.   BNPNo results for input(s): BNP, PROBNP in the last 168 hours.  DDimer No results for input(s): DDIMER in the last 168 hours.   Radiology    DG Chest Port 1 View  Result Date: 07/10/2021 CLINICAL DATA:  Sore chest post recent AVR EXAM: PORTABLE CHEST 1 VIEW COMPARISON:  07/09/2021 FINDINGS: No focal consolidation. No pleural effusion or pneumothorax. Stable cardiomegaly. Prior median sternotomy. Right-sided PICC line with the tip projecting over the cavoatrial junction. No acute osseous abnormality. IMPRESSION: 1. No acute cardiopulmonary disease. Electronically Signed   By: Kathreen Devoid M.D.   On: 07/10/2021 08:23    Cardiac Studies   Echocardiogram 06/27/2021    1. The aortic valve leafets are not well visualized but the valve appears  tricuspid with no significant calcification/thickening. There is very  eccentric, severe aortic regurgitation with diffuse color flow seen in the  LVOT. There is diastolic flow  reversal noted in the abdominal aorta. No distinct vegetations visualized.  No dissection flap seen but minimal visualization of the  ascending aorta.  Recommend TEE +/- CTA for further evaluation.   2. Left ventricular ejection fraction, by estimation, is 55 to 60%. The  left ventricle has normal function. The left ventricle has no regional  wall motion abnormalities. There is moderate concentric left ventricular  hypertrophy. Left ventricular  diastolic parameters are consistent with Grade I diastolic dysfunction  (impaired relaxation).   3. Right ventricular systolic function is normal. The right ventricular  size is normal.   4. Left atrial size was mildly dilated.    5. A small pericardial effusion is present. The pericardial effusion is  circumferential.   6. The mitral valve is normal in structure. Trivial mitral valve  regurgitation.   7. The inferior vena cava is dilated in size with <50% respiratory  variability, suggesting right atrial pressure of 15 mmHg.   8. Aortic dilatation noted. There is mild dilatation of the aortic root,  measuring 39 mm.    TEE 07/01/21 IMPRESSIONS   1. Left ventricular ejection fraction, by estimation, is 60 to 65%. The  left ventricle has normal function. There is moderate left ventricular  hypertrophy.   2. Right ventricular systolic function is normal. The right ventricular  size is normal.   3. No left atrial/left atrial appendage thrombus was detected.   4. A small pericardial effusion is present.   5. The mitral valve is normal in structure. Trivial mitral valve  regurgitation.   6. The noncoronary cusp of the aortic valve is abnormally thickened. The  basal portion has an almost fenestrated appearance, concerning for abscess  formation. Aortic regurgitation is eccentric and severe with backflow from  descending aorta.. The aortic  valve is tricuspid.   CT coronary 07/02/21 IMPRESSION: 1. Coronary calcium score of 0.  2. Normal coronary origin with right dominance.  3. Normal coronary arteries.  4. Mobile density on the [place] consistent with vegetation.  5. Thickening at the base of the [place] consistent with vegetation.  6. Pseudoaneurysm of the LVOT under the [place] consistent with aortic root/LVOT abscess.  7. Diverticulum of the basal septum.  8. Mid LAD myocardial bridge (normal variant).  Patient Profile     31 y.o. 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 Prescott Gum) -tissue cultures NGTD, blood cultures NGTD -antibiotics per ID, has PICC line in place, currently on daptomycin and ceftriaxone, monitoring CK -continue aspirin 81 mg daily given valve -will need antibiotic prophylaxis prior to dental procedures -changing metoprolol tartrate to succinate, to start tomorrow, for ease of administration -tolerating losartan for elevated blood pressure readings. Did not have a diagnosis of hypertension prior to admission -appears euvolemic, does not appear he will need long term diuretic.  [facility] will sign off.   Medication Recommendations:   Discharge medications: Aspirin 81 mg daily Metoprolol succinate 100 mg daily Losartan 25 mg daily Other recommendations (labs, testing, etc):  none Follow up as an outpatient:  We will arrange for outpatient cardiology follow up for long term monitoring of valve   For questions or updates, please contact [place] Please consult [email or web address] for contact info under     Signed, Buford Dresser, MD  07/11/2021, 9:58 AM

## 2021-07-11 NOTE — Progress Notes (Signed)
CARDIAC REHAB PHASE I  ? ?PRE:  Rate/Rhythm: 97 SR ? ?  BP: sitting 144/79 ? ?  SaO2: 96 RA ? ?MODE:  Ambulation: 770 ft  ? ?POST:  Rate/Rhythm: 115 ST ? ?  BP: sitting 146/93  ? ?  SaO2: 93 RA ? ?Pt able to get out of bed independently and walk halls independently. Appropriate pace, HR to 115 ST. No major c/o. Left on EOB, encouraged more walking and IS.  ?[phone]  ? ?Martin Stafford Ethelda Chick CES, ACSM ?07/11/2021 ?9:14 AM ? ? ? ? ?

## 2021-07-11 NOTE — Progress Notes (Signed)
Physical Therapy Treatment ?Patient Details ?Name: Martin Stafford ?MRN: [id number] ?DOB: [birth year] ?Today's Date: 07/11/2021 ? ? ?History of Present Illness 31 y.o. M who presents 07/05/2021 with nonradiating left sided chest pain, chills, sore throat. 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 S/p excision of pilonidal cyst and placement of wound VAC. 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 ? ?  ?PT Comments  ? ? Pt received up in room, agreeable to therapy session and with good participation and tolerance for stair and hallway gait progression without AD. Pt without LOB and safe with gait/stair negotiation, making good progress toward functional mobility goals. Pt SpO2 noted to be 92-93% on RA and pulling ~700-1,000 on IS, encouraged proper IS technique and will continue to follow acutely 1-2 more sessions with emphasis on endurance building to ensure safe progression to home, per chart review pt working well with mobility and cardiac rehab teams, encouraged frequent mobility. Pt requesting OPPT to focus on strength and endurance building as he will likely be returning to work, discussed with supervising PT [place] and frequency/disposition updated below.   ?Recommendations for follow up therapy are one component of a multi-disciplinary discharge planning process, led by the attending physician.  Recommendations may be updated based on patient status, additional functional criteria and insurance authorization. ? ?Follow Up Recommendations ? Outpatient PT ?  ?  ?Assistance Recommended at Discharge PRN  ?Patient can return home with the following A little help with walking and/or transfers;A little help with bathing/dressing/bathroom ?  ?Equipment Recommendations ? None recommended by PT  ?  ?Recommendations for Other Services   ? ? ?  ?Precautions / Restrictions Precautions ?Precautions: Fall;Sternal ?Precaution Comments: reviewed Move in the Tube  precs including log roll ?Restrictions ?Weight Bearing Restrictions: Yes ?Other Position/Activity Restrictions: sternal precautions  ?  ? ?Mobility ? Bed Mobility ?Overal bed mobility: Modified Independent ?Bed Mobility: Sidelying to Sit, Sit to Sidelying ?  ?Sidelying to sit: Supervision ?  ?  ?Sit to sidelying: Modified independent (Device/Increase time) ?General bed mobility comments: min cues for improved technique with sidelying to sit transfer to prevent pressure at sternal incision, pt receptive ?  ? ?Transfers ?Overall transfer level: Modified independent ?  ?  ?  ?  ?  ?  ?  ?  ?General transfer comment: Great technique, rocking forward to gain momentum, hands on knees, no physical assist to rise ?  ? ?Ambulation/Gait ?Ambulation/Gait assistance: Supervision ?Gait Distance (Feet): 750 Feet ?Assistive device: None ?Gait Pattern/deviations: Step-through pattern, Decreased stride length ?  ?  ?  ?General Gait Details: SpO2 92-93% on RA, HR WFL per monitor, slow to fair pace, no LOB ? ? ?Stairs ?Stairs: Yes ?Stairs assistance: Modified independent (Device/Increase time) ?Stair Management: No rails, Step to pattern, Forwards ?Number of Stairs: 10 ?General stair comments: 7" platform in room, no LOB or buckling, pt reports no LE pain so alternating which foot leads for stepping up. ? ? ?Wheelchair Mobility ?  ? ?Modified Rankin (Stroke Patients Only) ?  ? ? ?  ?Balance Overall balance assessment: No apparent balance deficits (not formally assessed) ?  ?  ?  ?  ? ?  ?Cognition Arousal/Alertness: Awake/alert ?Behavior During Therapy: Flat affect ?Overall Cognitive Status: Within Functional Limits for tasks assessed ?  ?  ?  ?  ?  ?  ?  ?  ?General Comments: pleasantly cooperative ?  ?  ? ?  ?Exercises Other Exercises ?Other Exercises:  Portion of gait billed as TE for BLE strengthening ? ?  ?General Comments General comments (skin integrity, edema, etc.): IS x10 reps 700-1052mL, pt needs cues for proper technique;  encourage hourly use; SpO2 92-93% on RA ?  ?  ? ?Pertinent Vitals/Pain Pain Assessment ?Pain Assessment: Faces ?Faces Pain Scale: Hurts a little bit ?Pain Location: incisional ?Pain Descriptors / Indicators: Guarding ?Pain Intervention(s): Monitored during session, Repositioned (RN notified he is requesting new ABD pad for incision in sacral area)  ? ? ?Home Living   ?Prior Function   ? ?PT Goals (current goals can now be found in the care plan section) Acute Rehab PT Goals ?Patient Stated Goal: feel better ?PT Goal Formulation: With patient ?Time For Goal Achievement: 07/22/21 ?Progress towards PT goals: Progressing toward goals ? ?  ?Frequency ? ? ? Min 1X/week ? ? ? ?  ?PT Plan Discharge plan needs to be updated;Frequency needs to be updated  ? ? ?   ?AM-PAC PT "6 Clicks" Mobility   ?Outcome Measure ? Help needed turning from your back to your side while in a flat bed without using bedrails?: None ?Help needed moving from lying on your back to sitting on the side of a flat bed without using bedrails?: None ?Help needed moving to and from a bed to a chair (including a wheelchair)?: None ?Help needed standing up from a chair using your arms (e.g., wheelchair or bedside chair)?: None ?Help needed to walk in hospital room?: A Little ?Help needed climbing 3-5 steps with a railing? : A Little ?6 Click Score: 22 ? ?  ?End of Session   ?Activity Tolerance: Patient tolerated treatment well ?Patient left: in bed;with call bell/phone within reach ?Nurse Communication: Mobility status;Other (comment) (pt needs new dressing at sacrum -abd pad) ?PT Visit Diagnosis: Unsteadiness on feet (R26.81);Difficulty in walking, not elsewhere classified (R26.2) ?  ? ? ?Time: 1610-9604 ?PT Time Calculation (min) (ACUTE ONLY): 30 min ? ?Charges:  $Gait Training: 8-22 mins ?$Therapeutic Exercise: 8-22 mins          ?          ? ?Dearia Wilmouth P., PTA ?Acute Rehabilitation Services ?Pager: [phone] ?Office: [phone]  ? ? ?Dorathy Kinsman  Cheyanne Lamison ?07/11/2021, 2:43 PM ? ?

## 2021-07-11 NOTE — Progress Notes (Addendum)
? ?                 [address] ?           [place] [address] ?         [phone]  ? ?6 Days Post-Op ?Procedure(s) (LRB): ?Homograft aortic root replacement using 87mm Cryo Aortic Valve. (N/A) ?TRANSESOPHAGEAL ECHOCARDIOGRAM (TEE) (N/A) ? ?Total Length of Stay:  LOS: 14 days  ? ?Subjective: ?Transferred from ICU to 4E yesterday.  ?Feels he is continuing to progress.  Denies pain.  No new complaints or concerns.   ? ?BM x 2 yesterday. ? ?Objective: ?Vital signs in last 24 hours: ?Temp:  [98.3 ?F (36.8 ?C)-100.8 ?F (38.2 ?C)] 100.8 ?F (38.2 ?C) (03/02 0320) ?Pulse Rate:  [92-107] 93 (03/02 0320) ?Cardiac Rhythm: Sinus tachycardia;Bundle branch block (03/01 1900) ?Resp:  [12-18] 17 (03/02 0320) ?BP: (116-144)/(68-92) 116/69 (03/02 0320) ?SpO2:  [91 %-98 %] 94 % (03/02 0320) ? ?Filed Weights  ? 07/08/21 0500 07/09/21 0600 07/10/21 0500  ?Weight: 115.6 kg 105.8 kg 103.9 kg  ? ? ?Intake/Output from previous day: ?03/01 0701 - 03/02 0700 ?In: 960 [P.O.:960] ?Out: 300 [Urine:300] ? ?Intake/Output this shift: ?No intake/output data recorded. ? ?Current Meds: ?Scheduled Meds: ? aspirin EC  81 mg Oral Daily  ? bisacodyl  10 mg Oral Daily  ? Or  ? bisacodyl  10 mg Rectal Daily  ? Chlorhexidine Gluconate Cloth  6 each Topical Daily  ? docusate sodium  200 mg Oral Daily  ? enoxaparin (LOVENOX) injection  40 mg Subcutaneous QHS  ? furosemide  40 mg Oral Daily  ? lactose free nutrition  237 mL Oral TID WC  ? losartan  25 mg Oral Daily  ? mouth rinse  15 mL Mouth Rinse BID  ? metoprolol tartrate  50 mg Oral BID  ? multivitamin with minerals  1 tablet Oral Daily  ? pantoprazole  40 mg Oral Daily  ? potassium chloride  20 mEq Oral BID  ? sodium chloride flush  10-40 mL Intracatheter Q12H  ? ?Continuous Infusions: ? cefTRIAXone (ROCEPHIN)  IV 2 g (07/10/21 1836)  ? DAPTOmycin (CUBICIN)  IV 900 mg (07/10/21 2233)  ? ?PRN Meds:.metoprolol tartrate, ondansetron (ZOFRAN) IV, oxyCODONE, sodium chloride flush,  traMADol ? ?General appearance: alert, cooperative, and no distress ?Neurologic: intact ?Heart: SR / ST 100-110/min ?Lungs: breath sounds clear  ?Abdomen: soft, NT  ?Extremities: no peripheral edema. PICC insertion site RUE clean and dry.  ?Wound: the sternotomy incision is well approximated and dry.  Pacer wires are out.  ? ?Lab Results: ?CBC: ?Recent Labs  ?  07/09/21 ?0332 07/10/21 ?0402  ?WBC 13.4* 14.2*  ?HGB 10.5* 11.0*  ?HCT 31.6* 33.5*  ?PLT 330 376  ? ? ?BMET:  ?Recent Labs  ?  07/09/21 ?0332 07/10/21 ?0402  ?NA 134* 135  ?K 3.3* 3.2*  ?CL 86* 91*  ?CO2 36* 34*  ?GLUCOSE 165* 111*  ?BUN 9 15  ?CREATININE 0.73 0.75  ?CALCIUM 9.3 9.2  ? ?  ?CMET: ?Lab Results  ?Component Value Date  ? WBC 14.2 (H) 07/10/2021  ? HGB 11.0 (L) 07/10/2021  ? HCT 33.5 (L) 07/10/2021  ? PLT 376 07/10/2021  ? GLUCOSE 111 (H) 07/10/2021  ? ALT 55 (H) 07/10/2021  ? AST 41 07/10/2021  ? NA 135 07/10/2021  ? K 3.2 (L) 07/10/2021  ? CL 91 (L) 07/10/2021  ? CREATININE 0.75 07/10/2021  ? BUN 15 07/10/2021  ? CO2 34 (  H) 07/10/2021  ? TSH 1.260 07/02/2021  ? INR 1.6 (H) 07/05/2021  ? HGBA1C 5.3 07/04/2021  ? ? ? ? ?PT/INR: No results for input(s): LABPROT, INR in the last 72 hours. ?Radiology: No results found. ? ? ?Assessment/Plan: ?S/P Procedure(s) (LRB): ?Homograft aortic root replacement using 33mm Cryo Aortic Valve. (N/A) ?TRANSESOPHAGEAL ECHOCARDIOGRAM (TEE) (N/A) ? ?-POD6 aortic root replacement for subvalvular abscess and severe aortic insufficiency. Operative cultures have remained negative. On Rocephin and daptomycin through April 4 per ID recommendations. BP and HR stable.      ? ?-HTN- BP improved, now on metoprolol 50mg  BID and losartan 25mg  daily. ? ?-Volume excess- well below pre-op Wt if accurate,  appears euvolemic. Continue oral Lasix today ? ?-PULM- mild resp insufficiency resolved, good sats on RA ? ?-GI- tolerating diet, bowel function returned.  ? ?-POD-8 excision of pilonidal cyst- Mgt per general surgery.   ? ?-Hypokalemia- supplementing with oral KCl. Monitor. ? ?-Disposition- TOC team planning for home antibiotic therapy via RUE PICC through 4/4. Anticipate he will be ready for discharge in 24 hours.  ? ?Martin Odea, PA-C ?[phone] ?07/11/2021 7:33 AM ? ?Progressing well, patient examined. ?Lasix po 5 days at discharge ?Office followup with cxr March 15  ? ?Martin Poot MD ?

## 2021-07-12 ENCOUNTER — Inpatient Hospital Stay (HOSPITAL_COMMUNITY): Payer: BC Managed Care – PPO

## 2021-07-12 DIAGNOSIS — Z952 Presence of prosthetic heart valve: Secondary | ICD-10-CM | POA: Diagnosis not present

## 2021-07-12 DIAGNOSIS — Z452 Encounter for adjustment and management of vascular access device: Secondary | ICD-10-CM | POA: Diagnosis not present

## 2021-07-12 DIAGNOSIS — I517 Cardiomegaly: Secondary | ICD-10-CM | POA: Diagnosis not present

## 2021-07-12 DIAGNOSIS — R918 Other nonspecific abnormal finding of lung field: Secondary | ICD-10-CM | POA: Diagnosis not present

## 2021-07-12 LAB — CBC
HCT: 32.1 % — ABNORMAL LOW (ref 39.0–52.0)
Hemoglobin: 10.5 g/dL — ABNORMAL LOW (ref 13.0–17.0)
MCH: 26.6 pg (ref 26.0–34.0)
MCHC: 32.7 g/dL (ref 30.0–36.0)
MCV: 81.5 fL (ref 80.0–100.0)
Platelets: 513 10*3/uL — ABNORMAL HIGH (ref 150–400)
RBC: 3.94 MIL/uL — ABNORMAL LOW (ref 4.22–5.81)
RDW: 15.2 % (ref 11.5–15.5)
WBC: 12.4 10*3/uL — ABNORMAL HIGH (ref 4.0–10.5)
nRBC: 0 % (ref 0.0–0.2)

## 2021-07-12 LAB — BASIC METABOLIC PANEL
Anion gap: 10 (ref 5–15)
BUN: 16 mg/dL (ref 6–20)
CO2: 27 mmol/L (ref 22–32)
Calcium: 8.8 mg/dL — ABNORMAL LOW (ref 8.9–10.3)
Chloride: 92 mmol/L — ABNORMAL LOW (ref 98–111)
Creatinine, Ser: 0.73 mg/dL (ref 0.61–1.24)
GFR, Estimated: >60 mL/min (ref >60)
Glucose, Bld: 103 mg/dL — ABNORMAL HIGH (ref 70–99)
Potassium: 3.8 mmol/L (ref 3.5–5.1)
Sodium: 129 mmol/L — ABNORMAL LOW (ref 135–145)

## 2021-07-12 LAB — ANAEROBIC CULTURE W GRAM STAIN: Gram Stain: NONE SEEN

## 2021-07-12 MED ORDER — LOSARTAN POTASSIUM 25 MG PO TABS: 25.0000 mg | ORAL_TABLET | Freq: Every day | ORAL | 5 refills | Status: DC

## 2021-07-12 MED ORDER — CEFTRIAXONE IV (FOR PTA / DISCHARGE USE ONLY): 2.0000 g | INTRAVENOUS | 0 refills | Status: AC

## 2021-07-12 MED ORDER — HEPARIN SOD (PORK) LOCK FLUSH 100 UNIT/ML IV SOLN
250.0000 [IU] | INTRAVENOUS | Status: AC | PRN
  Administered 2021-07-12 (×2): 250 [IU]
  Filled 2021-07-12: qty 2.5

## 2021-07-12 MED ORDER — METOPROLOL SUCCINATE ER 100 MG PO TB24: 100.0000 mg | ORAL_TABLET | Freq: Every day | ORAL | 5 refills | Status: DC

## 2021-07-12 MED ORDER — TRAMADOL HCL 50 MG PO TABS: 50.0000 mg | ORAL_TABLET | Freq: Four times a day (QID) | ORAL | 0 refills | Status: AC | PRN

## 2021-07-12 MED ORDER — DAPTOMYCIN IV (FOR PTA / DISCHARGE USE ONLY): 900.0000 mg | INTRAVENOUS | 0 refills | Status: AC

## 2021-07-12 MED ORDER — FUROSEMIDE 40 MG PO TABS: 40.0000 mg | ORAL_TABLET | Freq: Every day | ORAL | 0 refills | Status: DC

## 2021-07-12 MED ORDER — POTASSIUM CHLORIDE CRYS ER 20 MEQ PO TBCR: 20.0000 meq | EXTENDED_RELEASE_TABLET | Freq: Every day | ORAL | 0 refills | Status: DC

## 2021-07-12 MED ORDER — ASPIRIN 81 MG PO TBEC: 81.0000 mg | DELAYED_RELEASE_TABLET | Freq: Every day | ORAL | 11 refills | Status: AC

## 2021-07-12 NOTE — TOC Transition Note (Signed)
Transition of Care (TOC) - CM/SW Discharge Note ?Donn Pierini Charity fundraiser, BSN ?Transitions of Care ?Unit 4E- RN Case Manager ?See Treatment Team for direct phone #  ? ? ?Patient Details  ?Name: Martin Stafford ?MRN: [id number] ?Date of Birth: Mar 20, 1991 ? ?Transition of Care (TOC) CM/SW Contact:  ?Zenda Alpers, Lenn Sink, RN ?Phone Number: ?07/12/2021, 10:23 AM ? ? ?Clinical Narrative:    ?Pt stable for transition home today, Home IV abx have been arranged. OPAT order in, and bedside education has been completed per Pam with [facility]  ?HHRN has been set up with [facility] to assist with home abx and PICC line care.  ? ?Spoke with Elita Quick w/ [facility] this AM to confirm home abx delivery later today. Pt will need to get today's doses here of both abx prior to discharge. HH will see pt tomorrow. Pam has spoken with ID pharmacist regarding rescheduling the timing of abx for today in order for pt to get them prior to leaving. Will give abx at 11am and Noon. Pt will be able to discharge after abx infusions are completed. Bedside RN updated on plan.  ? ?No further needs noted.  ? ? ?Final next level of care: Home w Home Health Services ?Barriers to Discharge: No Barriers Identified ? ? ?Patient Goals and CMS Choice ?Patient states their goals for this hospitalization and ongoing recovery are:: to return home ?  ?Choice offered to / list presented to : Spouse, Patient ? ?Discharge Placement ?  ?           ?  ? Home w/ HH ?  ?  ? ?Discharge Plan and Services ?In-house Referral: NA ?Discharge Planning Services: CM Consult ?Post Acute Care Choice: Home Health          ?DME Arranged: IV pump/equipment ([facility]) ?  ?Date DME Agency Contacted: 07/09/21 ?Time DME Agency Contacted: 1637 ?Representative spoke with at DME Agency: Pam ?HH Arranged: IV Antibiotics ?HH Agency: [facility] ?Date HH Agency Contacted: 07/09/21 ?Time HH Agency Contacted: 1637 ?Representative spoke with at [facility] Agency:  Kandee Keen ? ?Social Determinants of Health (SDOH) Interventions ?  ? ? ?Readmission Risk Interventions ?Readmission Risk Prevention Plan 07/12/2021  ?Post Dischage Appt Complete  ?Medication Screening Complete  ?Transportation Screening Complete  ?Some recent data might be hidden  ? ? ? ? ? ?

## 2021-07-12 NOTE — Discharge Instructions (Signed)

## 2021-07-12 NOTE — Progress Notes (Signed)
? ?                 [address] ?           [name] [address] ?         [phone]  ? ?7 Days Post-Op ?Procedure(s) (LRB): ?Homograft aortic root replacement using 7mm Cryo Aortic Valve. (N/A) ?TRANSESOPHAGEAL ECHOCARDIOGRAM (TEE) (N/A) ? ?Total Length of Stay:  LOS: 15 days  ? ?Subjective: ?Says he had a good day yesterday.  No new problems or concerns.  Walked in the hall without any difficulty.  Maintaining oxygen saturations on room air. ? ?Objective: ?Vital signs in last 24 hours: ?Temp:  [98.5 ?F (36.9 ?C)-99.6 ?F (37.6 ?C)] 98.5 ?F (36.9 ?C) (03/03 0745) ?Pulse Rate:  [92-116] 101 (03/03 0745) ?Cardiac Rhythm: Normal sinus rhythm (03/02 2100) ?Resp:  [17-20] 19 (03/03 0745) ?BP: (110-144)/(60-81) 123/68 (03/03 0745) ?SpO2:  [94 %-97 %] 96 % (03/03 0745) ?Weight:  [103.6 kg] 103.6 kg (03/03 0338) ? ?Filed Weights  ? 07/09/21 0600 07/10/21 0500 07/12/21 0338  ?Weight: 105.8 kg 103.9 kg 103.6 kg  ? ? ?Intake/Output from previous day: ?03/02 0701 - 03/03 0700 ?In: 336 [IV Piggyback:336] ?Out: 0  ? ?Intake/Output this shift: ?No intake/output data recorded. ? ?Current Meds: ?Scheduled Meds: ? aspirin EC  81 mg Oral Daily  ? bisacodyl  10 mg Oral Daily  ? Or  ? bisacodyl  10 mg Rectal Daily  ? Chlorhexidine Gluconate Cloth  6 each Topical Daily  ? docusate sodium  200 mg Oral Daily  ? enoxaparin (LOVENOX) injection  40 mg Subcutaneous QHS  ? furosemide  40 mg Oral Daily  ? lactose free nutrition  237 mL Oral TID WC  ? losartan  25 mg Oral Daily  ? mouth rinse  15 mL Mouth Rinse BID  ? metoprolol succinate  100 mg Oral Daily  ? multivitamin with minerals  1 tablet Oral Daily  ? pantoprazole  40 mg Oral Daily  ? potassium chloride  20 mEq Oral BID  ? sodium chloride flush  10-40 mL Intracatheter Q12H  ? ?Continuous Infusions: ? cefTRIAXone (ROCEPHIN)  IV 2 g (07/11/21 1703)  ? DAPTOmycin (CUBICIN)  IV 900 mg (07/11/21 2142)  ? ?PRN Meds:.ondansetron (ZOFRAN) IV, oxyCODONE, sodium chloride  flush, traMADol ? ?General appearance: alert, cooperative, and no distress ?Neurologic: intact ?Heart: SR / ST 100-120/min ?Lungs: breath sounds clear  ?Abdomen: soft, NT  ?Extremities: no peripheral edema. PICC insertion site RUE clean and dry.  ?Wound: the sternotomy incision is well approximated and dry.  Pacer wires are out.  Chest tube sites are well-healed. ? ?Lab Results: ?CBC: ?Recent Labs  ?  07/10/21 ?0402 07/12/21 ?0410  ?WBC 14.2* 12.4*  ?HGB 11.0* 10.5*  ?HCT 33.5* 32.1*  ?PLT 376 513*  ? ? ?BMET:  ?Recent Labs  ?  07/10/21 ?0402 07/12/21 ?0410  ?NA 135 129*  ?K 3.2* 3.8  ?CL 91* 92*  ?CO2 34* 27  ?GLUCOSE 111* 103*  ?BUN 15 16  ?CREATININE 0.75 0.73  ?CALCIUM 9.2 8.8*  ? ?  ?CMET: ?Lab Results  ?Component Value Date  ? WBC 12.4 (H) 07/12/2021  ? HGB 10.5 (L) 07/12/2021  ? HCT 32.1 (L) 07/12/2021  ? PLT 513 (H) 07/12/2021  ? GLUCOSE 103 (H) 07/12/2021  ? ALT 55 (H) 07/10/2021  ? AST 41 07/10/2021  ? NA 129 (L) 07/12/2021  ? K 3.8 07/12/2021  ? CL 92 (L) 07/12/2021  ? CREATININE  0.73 07/12/2021  ? BUN 16 07/12/2021  ? CO2 27 07/12/2021  ? TSH 1.260 07/02/2021  ? INR 1.6 (H) 07/05/2021  ? HGBA1C 5.3 07/04/2021  ? ? ? ? ?PT/INR: No results for input(s): LABPROT, INR in the last 72 hours. ?Radiology: No results found. ? ? ?Assessment/Plan: ?S/P Procedure(s) (LRB): ?Homograft aortic root replacement using 22mm Cryo Aortic Valve. (N/A) ?TRANSESOPHAGEAL ECHOCARDIOGRAM (TEE) (N/A) ? ?-POD7 aortic root replacement for subvalvular abscess and severe aortic insufficiency. Operative cultures have remained negative. On Rocephin and daptomycin through April 4 per ID recommendations. BP and HR stable.      ? ?-HTN- BP improved, now on metoprolol 50mg  BID and losartan 25mg  daily. ? ?-Volume excess- well below pre-op Wt .  Appears euvolemic on exam.  Plan to continue oral Lasix for 5 days post discharge ? ?-PULM- mild resp insufficiency resolved, good sats on RA ? ?-GI- tolerating diet, bowel function normal.  ? ?-POD-9  excision of pilonidal cyst- Mgt per general surgery.  ? ?-Hypokalemia- supplemented and corrected ? ?-Disposition- discharge today with home antibiotic therapy via RUE PICC through 4/4. Home health RN arranged. Instructions discussed with patient and his friend who lives with him.   ? ?Antony Odea, PA-C ?[phone] ?07/12/2021 7:48 AM ? ? ?

## 2021-07-12 NOTE — Discharge Summary (Addendum)
Physician Discharge Summary  Patient ID: Martin Stafford MRN: [id number] DOB/AGE: 1990/07/11 31 y.o.  Admit date: 06/27/2021 Discharge date: 07/12/2021  Admission Diagnoses: Principal Problem:   Endocarditis of aortic valve Active Problems:   Chest pain   Severe aortic insufficiency   Fever   Hidradenitis   Skin ulcer of buttock ([place])   Sepsis ([place])   Discharge Diagnoses:  Principal Problem:   Endocarditis of aortic valve Active Problems:   Chest pain   Severe aortic insufficiency   Fever   Hidradenitis   Skin ulcer of buttock (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   Discharged Condition: stable  History of Present Illness:  Patient is a 31year old male presenting with nonradiating left-sided chest pain, chills, and sore throat with no significant past medical history.  Patient had approximately week long history of chest pain, sore throat which was then accompanied by chills and likely fever.  Was seen in the ED 4 times before being admitted  due to symptoms with slightly elevated troponin which began to trend downward (122> 140> 114> 109).  Atypical symptoms and EKG were reassuring to rule out MI.  Cardiology was consulted and ordered echocardiogram to assess for pericarditis in the setting of likely viral illness patient was started on colchicine and ibuprofen.    Echocardiogram showed LVEF of 517-00 grade 1 diastolic dysfunction, small circumferential pericardial effusion, trivial mitral, pulmonic and tricuspid valve regurg, severe aortic regurgitation with diffuse color-flow in LVOT, with diastolic flow reversal in abdominal aorta.  CT angio was ordered which ruled out aortic dissection/aneurysm. Blood cx obtained on 2/16 and 2/17 showed no  growth.  With a diagnosis of endocarditis  patient was empirically started on cefepime 2 g TID and vancomycin 2 g once followed by 1.5 g BID. ID consulted and cefepime switched to ceftriaxone 2g daily. TEE on 07/01/21 confirmed severe aortic insufficiency with significant flow reversal in the thoracic aorta.  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  This was evaluated by general surgery.  Given the need for eventual aortic valve replacement, resection of the draining tract was recommended.  This was carried out by Dr. BNinfa Lindenon 07/03/2021.  A negative pressure dressing was placed over the site at the conclusion of the procedure.  The patient was returned to the surgical floor.  IV antibiotics continued.  He remained stable throughout this procedure and during the early recovery phase.  Hospital Course: On 07/05/2021, he was taken to the operating room by Dr. VPrescott Gumfor aortic valve replacement utilizing a homograft. Patient was extubated on the afternoon of postop day #1.  He has been kept on vancomycin and ceftriaxone per ID recommendations. The final intraoperative culture report report showed no growth.  He has some expected postoperative volume overload and is responding well to diuretics.  He has an expected postoperative acute blood loss anemia and values have stabilized.  He has required transfusion of both packed cells as well as multiple products.  Since the, his Hgb and plt counts have been stable. Renal function has remained within normal limits. Diet and activity  were advanced and well tolerated.  The negative pressure dressing to the pilonidal cyst resection was removed on 2/28 and the site was intact whith some draining as expected per general surgery progress notes.  By 07/10/21, he was ready for transfer to [facility]. He  continued to progress and resumed independent mobility. He remained in a stable sinus rhythm.  The infectious disease consultants recommended continuation of Daptomycin and ceftriaxone for 6 weeks (through 08/13/21).  A PICC line was placed and home health nursing arranged so this could be accomplished in the outpatient setting.   Consults: cardiology and ID  Significant Diagnostic Studies:    ECHOCARDIOGRAM REPORT         Patient Name:   NORM WRAY Date of Exam: 06/27/2021  Medical Rec #:  [id number]         Height:       71.0 in  Accession #:    [id number]        Weight:       235.0 lb  Date of Birth:  11-11-1990         BSA:          2.258 m  Patient Age:    30 years          BP:           114/48 mmHg  Patient Gender: M                 HR:           88 bpm.  Exam Location:  Inpatient   Procedure: 2D Echo   Indications:     Chest pain     History:         Patient has no prior history of Echocardiogram  examinations.     Sonographer:     Jefferey Pica  Referring Phys:  [id number] Margie Billet  Diagnosing Phys: Gwyndolyn Kaufman MD   IMPRESSIONS     1. The aortic valve leafets are not well visualized but the valve appears  tricuspid with no significant calcification/thickening. There is very  eccentric, severe aortic regurgitation with diffuse color flow seen in the  LVOT. There is diastolic flow  reversal noted in the abdominal aorta. No distinct vegetations visualized.  No dissection flap seen but minimal visualization of the ascending aorta.  Recommend TEE +/- CTA for further evaluation.   2. Possible perimembranous VSD visualized. Recommend TEE as above for  further evaluation.   3. Left ventricular ejection fraction, by estimation, is 55 to 60%. The  left ventricle has normal function. The left ventricle has no regional  wall motion abnormalities. There is moderate concentric left ventricular  hypertrophy. Left ventricular  diastolic parameters are  consistent with Grade I diastolic dysfunction  (impaired relaxation).   4. Right ventricular systolic function is normal. The right ventricular  size is normal.   5. Left atrial size was mildly dilated.   6. A small pericardial effusion is present. The pericardial effusion is  circumferential.   7. The mitral valve is normal in structure. Trivial mitral valve  regurgitation.   8. The inferior vena cava is dilated in size with <50% respiratory  variability, suggesting right atrial pressure of 15 mmHg.   9. Aortic dilatation noted. There is mild dilatation of the aortic root,  measuring 39 mm.   Comparison(s): No prior Echocardiogram.   FINDINGS   Left Ventricle: Left ventricular ejection fraction, by estimation, is 55  to 60%.  The left ventricle has normal function. The left ventricle has no  regional wall motion abnormalities. The left ventricular internal cavity  size was normal in size. There is   moderate concentric left ventricular hypertrophy. Left ventricular  diastolic parameters are consistent with Grade I diastolic dysfunction  (impaired relaxation).   Right Ventricle: The right ventricular size is normal. No increase in  right ventricular wall thickness. Right ventricular systolic function is  normal.   Left Atrium: Left atrial size was mildly dilated.   Right Atrium: Right atrial size was normal in size.   Pericardium: A small pericardial effusion is present. The pericardial  effusion is circumferential.   Mitral Valve: The mitral valve is normal in structure. Trivial mitral  valve regurgitation.   Tricuspid Valve: The tricuspid valve is normal in structure. Tricuspid  valve regurgitation is trivial.   Aortic Valve: The aortic valve leafets are not well visualized but the  valve appears tricuspid with minimal calcification/thickening. There is  very eccentric, severe aortic regurgitation with diffuse color flow seen  in the LVOT. There is diastolic flow   reversal noted in the abdominal aorta. Recommend TEE for further  evaluation. The aortic valve was not well visualized. Aortic valve  regurgitation is severe. No aortic stenosis is present. Aortic valve peak  gradient measures 16.0 mmHg.   Pulmonic Valve: The pulmonic valve was normal in structure. Pulmonic valve  regurgitation is trivial.   Aorta: Aortic dilatation noted. There is mild dilatation of the aortic  root, measuring 39 mm.   Venous: The inferior vena cava is dilated in size with less than 50%  respiratory variability, suggesting right atrial pressure of 15 mmHg.   IAS/Shunts: The atrial septum is grossly normal.      LEFT VENTRICLE  PLAX 2D  LVIDd:         5.60 cm  LVIDs:         3.80 cm  LV PW:         1.65 cm  LV IVS:        1.60 cm  LVOT diam:     3.40 cm  LV SV:         270  LV SV Index:   119  LVOT Area:     9.08 cm      RIGHT VENTRICLE             IVC  RV Basal diam:  3.10 cm     IVC diam: 2.30 cm  RV S prime:     15.50 cm/s  TAPSE (M-mode): 2.6 cm   LEFT ATRIUM             Index        RIGHT ATRIUM           Index  LA diam:        4.00 cm 1.77 cm/m   RA Area:     17.60 cm  LA Vol (A2C):   84.2 ml 37.29 ml/m  RA Volume:   46.80 ml  20.72 ml/m  LA Vol (A4C):   72.0 ml 31.88 ml/m  LA Biplane Vol: 80.7 ml 35.74 ml/m   AORTIC VALVE                 PULMONIC VALVE  AV Area (Vmax): 6.63 cm     PV Vmax:       1.09 m/s  AV Vmax:        200.00 cm/s  PV Peak grad:  4.8 mmHg  AV Peak Grad:   16.0 mmHg  LVOT Vmax:      146.00 cm/s  LVOT Vmean:     86.400 cm/s  LVOT VTI:       0.297 m     AORTA  Ao Asc diam: 3.10 cm   MITRAL VALVE  MV Area (PHT): 4.39 cm     SHUNTS  MV Decel Time: 173 msec     Systemic VTI:  0.30 m  MV E velocity: 82.80 cm/s   Systemic Diam: 3.40 cm  MV A velocity: 106.00 cm/s  MV E/A ratio:  0.78   Gwyndolyn Kaufman MD  Electronically signed by Gwyndolyn Kaufman MD  Signature Date/Time: 06/27/2021/4:10:46 PM            EXAM: Cardiac/Coronary CTA  ADDENDUM REPORT: 07/02/2021 16:02   CLINICAL DATA:  Severe aortic regurgitation. Preoperative assessment.   TECHNIQUE: A non-contrast, gated CT scan was obtained with axial slices of 3 mm through the heart for calcium scoring. Calcium scoring was performed using the Agatston method. A 100 kV prospective, gated, contrast cardiac scan was obtained. Gantry rotation speed was 250 msecs and collimation was 0.6 mm. Two sublingual nitroglycerin tablets (0.8 mg) were given. The 3D data set was reconstructed in 5% intervals of the 35-75% of the R-R cycle. Diastolic phases were analyzed on a dedicated workstation using MPR, MIP, and VRT modes. The patient received 95 cc of contrast.   FINDINGS: Image quality: Excellent.  Mild motion artifact.   Noise artifact is: Limited.   Aortic valve: There is a mobile density attached to the base of the [place] that likely represents vegetation given the clinical history. There is also thickening of the base of the LCC likely consistent with vegetation. There is a small pocket of contrast extravasation under the [place] in the LVOT consistent with pseudoaneurysm. Findings are consistent with abscess of the aortic root/LVOT. There is incomplete coaptation of the aortic valve leaflets consistent with at least moderate AI. There is also a diverticulum of the basal septum, which could represent extension of infection.   Coronary Arteries:  Normal coronary origin.  Right dominance.   Left main: The left main is a large caliber vessel with a normal take off from the left coronary cusp that bifurcates to form a left anterior descending artery and a left circumflex artery. There is no plaque or stenosis.   Left anterior descending artery: The LAD is patent without evidence of plaque or stenosis. Mid LAD myocardial bridge (normal variant). The LAD gives off 2 patent diagonal branches.   Left circumflex artery: The LCX is  non-dominant and patent with no evidence of plaque or stenosis. The LCX gives off 2 patent obtuse marginal branches.   Right coronary artery: The RCA is dominant with normal take off from the right coronary cusp. There is no evidence of plaque or stenosis. The RCA terminates as a PDA and right posterolateral branch without evidence of plaque or stenosis.   Right Atrium: Right atrial size is within normal limits.   Right Ventricle: The right ventricular cavity is within normal limits.   Left Atrium: Left atrial size is normal in size with no left atrial appendage filling defect.   Left Ventricle: The ventricular cavity size is within normal limits. There are no stigmata of prior infarction. There is no abnormal filling defect.   Pulmonary arteries: Normal in size without proximal filling defect.   Pulmonary veins: Normal pulmonary venous drainage.   Pericardium: Normal thickness.  Small circumferential pericardial effusion.   Cardiac valves: The aortic valve is trileaflet without significant calcification. The mitral valve is normal structure without significant calcification.   Aorta: Normal caliber with no significant disease.   Extra-cardiac findings: See attached radiology report for non-cardiac structures.   IMPRESSION: 1. Coronary calcium score of 0.   2. Normal coronary origin with right dominance.   3. Normal coronary arteries.   4. Mobile density on the [place] consistent with vegetation.   5. Thickening at the base of the [place] consistent with vegetation.   6. Pseudoaneurysm of the LVOT under the [place] consistent with aortic root/LVOT abscess.   7. Diverticulum of the basal septum.   8. Mid LAD myocardial bridge (normal variant).   Eleonore Chiquito, MD     Electronically Signed   By: Eleonore Chiquito M.D.   On: 07/02/2021 16:02      CLINICAL DATA:  Fever, chills, newly diagnosed aortic insufficiency, question aortic dissection. Had CTA chest for pulmonary  embolism earlier   EXAM: CT ANGIOGRAPHY CHEST WITH CONTRAST   TECHNIQUE: Multidetector CT imaging of the chest was performed using the standard protocol during bolus administration of intravenous contrast. Multiplanar CT image reconstructions and MIPs were obtained to evaluate the vascular anatomy.   RADIATION DOSE REDUCTION: This exam was performed according to the departmental dose-optimization program which includes automated exposure control, adjustment of the mA and/or kV according to patient size and/or use of iterative reconstruction technique.   CONTRAST:  177m OMNIPAQUE IOHEXOL 350 MG/ML SOLN IV   COMPARISON:  Earlier study of 06/27/2021   FINDINGS: Cardiovascular: Small pericardial effusion. Mild enlargement of cardiac chambers. Aorta normal caliber without aneurysm or dissection. Pulmonary arteries appear patent on non targeted exam. No gross pulmonary embolism.   Mediastinum/Nodes: Esophagus unremarkable. Base of cervical region normal appearance. No thoracic adenopathy.   Lungs/Pleura: Minimal dependent atelectasis in the posterior lower lobes. Lungs otherwise clear. No pulmonary infiltrate, pleural effusion, or pneumothorax.   Upper Abdomen: Small cyst at upper pole LEFT kidney. Visualized upper abdomen otherwise unremarkable.   Musculoskeletal: Osseous structures unremarkable.   Review of the MIP images confirms the above findings.   IMPRESSION: No evidence of aortic aneurysm or aortic dissection.   Small pericardial effusion with mild enlargement of cardiac chambers.   Minimal dependent atelectasis in the posterior lower lobes.     Electronically Signed   By: MLavonia DanaM.D.   On: 06/27/2021 18:24  Treatments: Operative Report    DATE OF PROCEDURE: 07/05/2021     OPERATION:  1.  Homograft aortic root replacement using a 25 mm cryopreserved aortic root [employer] ##[id number] 2.  Debridement and closure of subannular abscess in the left  ventricular endocardium of the ventricular septum.   SURGEON:  Dr. PTharon AquasTrigt.   ASSISTANTS: MEnid Cutter PA-C and WJadene PieriniPA-C.     ANESTHESIA:  General.     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   CLINICAL NOTE:  The patient is a 31year old male who has had subacute illnesses for the past several days and was seen on multiple occasions in the EGD before diagnosis was made based on an abnormal cardiac murmur.  Echocardiogram showed the evidence of  severe aortic insufficiency with vegetations and he was admitted to the cardiology service.  He was started on empiric IV antibiotics after blood cultures, which were drawn and were negative.  The patient had no other valvular involvement from the  endocarditis and had preserved LV systolic function.  A comprehensive evaluation of the patient was made to identify the origin of the infection and bacteremia; however, the only suspicious site was an indolent infection of a previous pilonidal cyst  excision with a chronically draining sinus, which was approximately 4 cm deep.  The patient had a cardiac CT scan, which showed no significant coronary artery disease in addition to confirming the endocarditis and subannular abscess.   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  I discussed the indications and expected benefits of homograft aortic root replacement with debridement and closure of the  subannular abscess with the patient and family.  We discussed the details of surgery including the use of general anesthesia and cardiopulmonary bypass, the location of the surgical incisions, and the expected postoperative recovery.  We discussed the  risks to him of stroke, recurrent infection, bleeding,  blood transfusion requirement, organ failure, arrhythmia, and death.  He demonstrated his understanding, understood that this was a high risk operation with severe cardiac disease, and agreed to  proceed with surgery under what I felt was an informed consent.    Discharge Exam: Blood pressure 123/68, pulse (!) 101, temperature 98.5 F (36.9 C), temperature source Oral, resp. rate 19, height 5' 11"  (1.803 m), weight 103.6 kg, SpO2 96 %.  General appearance: alert, cooperative, and no distress Neurologic: intact Heart: SR / ST 100-120/min Lungs: breath sounds clear  Abdomen: soft, NT  Extremities: no peripheral edema. PICC insertion site RUE clean and dry.  Wound: the sternotomy incision is well approximated and dry.  Pacer wires are out.  Chest tube sites are well-healed.  Disposition:   Discharge Instructions     [facility] pharmacist to adjust dose for Vancomycin, Aminoglycosides and other anti-infective therapies as requested by physician.   Complete by: As directed    [facility] to provide Cath Flo 64m   Complete by: As directed    Administer for PICC line occlusion and as ordered by physician for other access device issues.   Anaphylaxis Kit: Provided to treat any anaphylactic reaction to the medication being provided to the patient if First Dose or when requested by physician   Complete by: As directed    Epinephrine 125mml vial / amp: Administer 0.60m72m0.60ml39mubcutaneously once for moderate to severe anaphylaxis, nurse to call physician and pharmacy when reaction occurs and call 911 if needed for immediate care   Diphenhydramine 50mg86mIV vial: Administer 25-50mg 59mM PRN for first dose reaction, rash, itching, mild reaction, nurse to call physician and pharmacy when reaction occurs   Sodium Chloride 0.9% NS 500ml I70mdminister if needed for hypovolemic blood pressure drop or as ordered by physician after call to physician with anaphylactic reaction    Change dressing on IV access line weekly and PRN   Complete by: As directed    Flush IV access with Sodium Chloride 0.9% and Heparin 10 units/ml or 100 units/ml   Complete by: As directed    Home infusion instructions - [facility]   Complete by: As directed    Instructions: Flush IV access with Sodium Chloride 0.9% and Heparin 10units/ml or 100units/ml   Change dressing on IV access line: Weekly and PRN   Instructions Cath Flo 2mg: Ad15mister for PICC Line occlusion and as ordered by physician for other access device  [facility] pharmacist to adjust dose for: Vancomycin, Aminoglycosides and other anti-infective therapies as requested by physician   Method of administration may be changed at the discretion of home infusion pharmacist based upon assessment of the patient and/or caregivers ability to self-administer the medication ordered   Complete by: As directed       Allergies as of 07/12/2021       Reactions   Eggs Or Egg-derived Products    "eggs"   Peanut-containing Drug Products    "peanuts"        Medication List     STOP taking these medications    naproxen 500 MG tablet Commonly known as: NAPROSYN   omeprazole 20 MG capsule Commonly known as: PRILOSEC   ondansetron 4 MG tablet Commonly known as: ZOFRAN   silver sulfADIAZINE 1 % cream Commonly known as: SILVADENE       TAKE these medications    acetaminophen 325 MG tablet Commonly known as: TYLENOL Take 650 mg by mouth every 6 (six) hours as needed for mild pain or headache.   aspirin 81 MG EC tablet Take 1 tablet (81 mg total) by mouth daily. Swallow whole.   cefTRIAXone  IVPB Commonly known as: ROCEPHIN Inject 2 g into the vein daily. Indication:  Culture negative AV Endocarditis First Dose: Yes Last Day of Therapy:  08/15/21 Labs - Once weekly:  CBC/D and BMP, Labs - Every other week:  ESR and CRP Method of administration: IV Push Method of administration may be changed at  the discretion of home infusion pharmacist based upon assessment of the patient and/or caregiver's ability to self-administer the medication ordered.   daptomycin  IVPB Commonly known as: CUBICIN Inject 900 mg into the vein daily. Indication:  Culture Negative AV Endocarditis First Dose: Yes Last Day of Therapy:  08/15/21 Labs - Once weekly:  CBC/D, BMP, and CPK Labs - Every other week:  ESR and CRP Method of administration: IV Push Method of administration may be changed at the discretion of home infusion pharmacist based upon assessment of the patient and/or caregiver's ability to self-administer the medication ordered.   furosemide 40 MG tablet Commonly known as: LASIX Take 1 tablet (40 mg total) by mouth daily for 5 days.   losartan 25 MG tablet Commonly known as: COZAAR Take 1 tablet (25 mg total) by mouth daily.   metoprolol succinate 100 MG 24 hr tablet Commonly known as: TOPROL-XL Take 1 tablet (100 mg total) by mouth daily. Take with or immediately following a meal.   pantoprazole 20 MG tablet Commonly known as: PROTONIX Take 1 tablet (20 mg total) by mouth daily.   potassium chloride SA 20 MEQ tablet Commonly known as: KLOR-CON M Take 1 tablet (20 mEq total) by mouth daily for 5 days.   traMADol 50 MG tablet Commonly known as: ULTRAM Take 1-2 tablets (50-100 mg total) by mouth every 6 (six) hours as needed for up to 7 days for moderate pain.   VITAMIN B-6 PO Take 1 tablet by mouth daily.   VITAMIN C PO Take 1 tablet by mouth daily.               Discharge Care Instructions  (From admission, onward)           Start     Ordered   07/12/21 0000  Change dressing on IV access line weekly and PRN  (Home infusion instructions - [facility] )        07/12/21  6301            Follow-up Information     Coralie Keens, MD. Go on 07/19/2021.   Specialty: General Surgery Why: Call office for appointment time. Contact information: [place] [address] [phone]         Care, [facility] Follow up.   Specialty: Home Health Services Why: Home Health Registered Nurse-office to call the patient. Contact information: [place] [address] [phone]         Dahlia Byes, MD. Go on 07/24/2021.   Specialty: Cardiothoracic Surgery Why: Your appointment is at 4pm.        Comer, Okey Regal, MD. Go on 07/30/2021.   Specialty: Infectious Diseases Why: Your appointment is at 4pm. Contact information: [address] [place] [address] [phone]         Lenna Sciara, NP. Go on 08/01/2021.   Specialties: Nurse Practitioner, Family Medicine Why: Your appointment is at 9:30am. Contact information: [address] [place] [address] [phone]                 Signed: Antony Odea, PA-C 07/12/2021, 8:53 AM Discharge instructions were discussed with patient. patient examined and medical record reviewed,agree with above note. Dahlia Byes 07/20/2021

## 2021-07-12 NOTE — Plan of Care (Signed)
  Problem: Health Behavior/Discharge Planning: Goal: Ability to manage health-related needs will improve Outcome: Progressing   Problem: Clinical Measurements: Goal: Will remain free from infection Outcome: Progressing Goal: Diagnostic test results will improve Outcome: Progressing Goal: Respiratory complications will improve Outcome: Progressing Goal: Cardiovascular complication will be avoided Outcome: Progressing   

## 2021-07-12 NOTE — Progress Notes (Signed)
Mobility Specialist Progress Note ? ? 07/12/21 1123  ?Mobility  ?Activity Ambulated independently in hallway  ?Level of Assistance Independent after set-up  ?Assistive Device None  ?Distance Ambulated (ft) 470 ft  ?Activity Response Tolerated well  ?$Mobility charge 1 Mobility  ? ?Received pt in doorway having no complaints and agreeable to mobility. Asymptomatic throughout ambulation, returned back to bed w/ call bell in reach and all needs met. ? ?Holland Falling ?Mobility Specialist ?Phone Number [phone] ? ?

## 2021-07-12 NOTE — Progress Notes (Signed)
Pt just out of shower. Quite tired, HR 115 on EOB on pulse ox (off monitor). Discussed with pt and fiance IS, sternal precautions, exercise, diet, and CRPII. Pt receptive. Will refer to [place] CRPII.  ?[id number] ?Ethelda Chick CES, ACSM ?10:49 AM ?07/12/2021 ? ?

## 2021-07-13 DIAGNOSIS — A419 Sepsis, unspecified organism: Secondary | ICD-10-CM | POA: Diagnosis not present

## 2021-07-13 DIAGNOSIS — I358 Other nonrheumatic aortic valve disorders: Secondary | ICD-10-CM | POA: Diagnosis not present

## 2021-07-13 DIAGNOSIS — I330 Acute and subacute infective endocarditis: Secondary | ICD-10-CM | POA: Diagnosis not present

## 2021-07-14 DIAGNOSIS — I358 Other nonrheumatic aortic valve disorders: Secondary | ICD-10-CM | POA: Diagnosis not present

## 2021-07-14 DIAGNOSIS — A419 Sepsis, unspecified organism: Secondary | ICD-10-CM | POA: Diagnosis not present

## 2021-07-14 DIAGNOSIS — I330 Acute and subacute infective endocarditis: Secondary | ICD-10-CM | POA: Diagnosis not present

## 2021-07-15 ENCOUNTER — Telehealth (HOSPITAL_COMMUNITY): Payer: Self-pay

## 2021-07-15 DIAGNOSIS — Z952 Presence of prosthetic heart valve: Secondary | ICD-10-CM | POA: Diagnosis not present

## 2021-07-15 DIAGNOSIS — A419 Sepsis, unspecified organism: Secondary | ICD-10-CM | POA: Diagnosis not present

## 2021-07-15 DIAGNOSIS — L0592 Pilonidal sinus without abscess: Secondary | ICD-10-CM | POA: Diagnosis not present

## 2021-07-15 DIAGNOSIS — Z9181 History of falling: Secondary | ICD-10-CM | POA: Diagnosis not present

## 2021-07-15 DIAGNOSIS — J9811 Atelectasis: Secondary | ICD-10-CM | POA: Diagnosis not present

## 2021-07-15 DIAGNOSIS — Z48812 Encounter for surgical aftercare following surgery on the circulatory system: Secondary | ICD-10-CM | POA: Diagnosis not present

## 2021-07-15 DIAGNOSIS — Z792 Long term (current) use of antibiotics: Secondary | ICD-10-CM | POA: Diagnosis not present

## 2021-07-15 DIAGNOSIS — I358 Other nonrheumatic aortic valve disorders: Secondary | ICD-10-CM | POA: Diagnosis not present

## 2021-07-15 DIAGNOSIS — L732 Hidradenitis suppurativa: Secondary | ICD-10-CM | POA: Diagnosis not present

## 2021-07-15 DIAGNOSIS — I088 Other rheumatic multiple valve diseases: Secondary | ICD-10-CM | POA: Diagnosis not present

## 2021-07-15 DIAGNOSIS — I4510 Unspecified right bundle-branch block: Secondary | ICD-10-CM | POA: Diagnosis not present

## 2021-07-15 DIAGNOSIS — Z79891 Long term (current) use of opiate analgesic: Secondary | ICD-10-CM | POA: Diagnosis not present

## 2021-07-15 DIAGNOSIS — I330 Acute and subacute infective endocarditis: Secondary | ICD-10-CM | POA: Diagnosis not present

## 2021-07-15 DIAGNOSIS — Z452 Encounter for adjustment and management of vascular access device: Secondary | ICD-10-CM | POA: Diagnosis not present

## 2021-07-15 DIAGNOSIS — I38 Endocarditis, valve unspecified: Secondary | ICD-10-CM | POA: Diagnosis not present

## 2021-07-15 DIAGNOSIS — I119 Hypertensive heart disease without heart failure: Secondary | ICD-10-CM | POA: Diagnosis not present

## 2021-07-15 NOTE — Telephone Encounter (Signed)
Pt insurance is active and benefits verified through [employer] Co-pay 0, DED $5,000/$5,000 met, out of pocket $5,000/$5,000 met, co-insurance 40%. no pre-authorization required. Passport, 07/15/2021_0 :12pm, REF# [id number] ?  ?Will contact patient to see if he is interested in the Cardiac Rehab Program. If interested, patient will need to complete follow up appt. Once completed, patient will be contacted for scheduling upon review by the RN Navigator. ?

## 2021-07-15 NOTE — Telephone Encounter (Signed)
Called patient to see if he is interested in the Cardiac Rehab Program. Patient expressed interest. Explained scheduling process and went over insurance, patient verbalized understanding. Will contact patient for scheduling once f/u has been completed.  °

## 2021-07-16 ENCOUNTER — Telehealth: Payer: Self-pay

## 2021-07-16 DIAGNOSIS — I330 Acute and subacute infective endocarditis: Secondary | ICD-10-CM | POA: Diagnosis not present

## 2021-07-16 DIAGNOSIS — A419 Sepsis, unspecified organism: Secondary | ICD-10-CM | POA: Diagnosis not present

## 2021-07-16 DIAGNOSIS — I358 Other nonrheumatic aortic valve disorders: Secondary | ICD-10-CM | POA: Diagnosis not present

## 2021-07-16 NOTE — Telephone Encounter (Signed)
Patient contacted the office concerned about his sternal incision/ some drainage. He is s/p aortic root replacement with Dr. Donata Clay 07/05/21. He is inquiring if this is normal or of concern. States that it happened early this morning, and stopped shortly after. States that it was clear/blood-tinged. States that he thinks he may have knocked a scab off. He denies any redness, warmth, swelling, or temperature. Advised that it can happen and to keep watch of the incision. Advised that he is to contact the office back if he starts to notice any changes to the area such as warmth, redness, swelling, febrile, odor, or increase/change in drainage color. He acknowledged receipt. ?

## 2021-07-17 DIAGNOSIS — I358 Other nonrheumatic aortic valve disorders: Secondary | ICD-10-CM | POA: Diagnosis not present

## 2021-07-17 DIAGNOSIS — I330 Acute and subacute infective endocarditis: Secondary | ICD-10-CM | POA: Diagnosis not present

## 2021-07-17 DIAGNOSIS — A419 Sepsis, unspecified organism: Secondary | ICD-10-CM | POA: Diagnosis not present

## 2021-07-18 DIAGNOSIS — I358 Other nonrheumatic aortic valve disorders: Secondary | ICD-10-CM | POA: Diagnosis not present

## 2021-07-18 DIAGNOSIS — I330 Acute and subacute infective endocarditis: Secondary | ICD-10-CM | POA: Diagnosis not present

## 2021-07-18 DIAGNOSIS — A419 Sepsis, unspecified organism: Secondary | ICD-10-CM | POA: Diagnosis not present

## 2021-07-19 DIAGNOSIS — A419 Sepsis, unspecified organism: Secondary | ICD-10-CM | POA: Diagnosis not present

## 2021-07-19 DIAGNOSIS — I358 Other nonrheumatic aortic valve disorders: Secondary | ICD-10-CM | POA: Diagnosis not present

## 2021-07-19 DIAGNOSIS — I330 Acute and subacute infective endocarditis: Secondary | ICD-10-CM | POA: Diagnosis not present

## 2021-07-19 LAB — MISCELLANEOUS TEST

## 2021-07-20 DIAGNOSIS — I330 Acute and subacute infective endocarditis: Secondary | ICD-10-CM | POA: Diagnosis not present

## 2021-07-20 DIAGNOSIS — A419 Sepsis, unspecified organism: Secondary | ICD-10-CM | POA: Diagnosis not present

## 2021-07-20 DIAGNOSIS — I358 Other nonrheumatic aortic valve disorders: Secondary | ICD-10-CM | POA: Diagnosis not present

## 2021-07-21 DIAGNOSIS — I358 Other nonrheumatic aortic valve disorders: Secondary | ICD-10-CM | POA: Diagnosis not present

## 2021-07-21 DIAGNOSIS — I330 Acute and subacute infective endocarditis: Secondary | ICD-10-CM | POA: Diagnosis not present

## 2021-07-21 DIAGNOSIS — A419 Sepsis, unspecified organism: Secondary | ICD-10-CM | POA: Diagnosis not present

## 2021-07-22 ENCOUNTER — Other Ambulatory Visit: Payer: Self-pay | Admitting: Cardiothoracic Surgery

## 2021-07-22 DIAGNOSIS — Z952 Presence of prosthetic heart valve: Secondary | ICD-10-CM | POA: Diagnosis not present

## 2021-07-22 DIAGNOSIS — I330 Acute and subacute infective endocarditis: Secondary | ICD-10-CM | POA: Diagnosis not present

## 2021-07-22 DIAGNOSIS — L0592 Pilonidal sinus without abscess: Secondary | ICD-10-CM | POA: Diagnosis not present

## 2021-07-22 DIAGNOSIS — L732 Hidradenitis suppurativa: Secondary | ICD-10-CM | POA: Diagnosis not present

## 2021-07-22 DIAGNOSIS — J9811 Atelectasis: Secondary | ICD-10-CM | POA: Diagnosis not present

## 2021-07-22 DIAGNOSIS — Z792 Long term (current) use of antibiotics: Secondary | ICD-10-CM | POA: Diagnosis not present

## 2021-07-22 DIAGNOSIS — Z9181 History of falling: Secondary | ICD-10-CM | POA: Diagnosis not present

## 2021-07-22 DIAGNOSIS — Z9889 Other specified postprocedural states: Secondary | ICD-10-CM

## 2021-07-22 DIAGNOSIS — Z79891 Long term (current) use of opiate analgesic: Secondary | ICD-10-CM | POA: Diagnosis not present

## 2021-07-22 DIAGNOSIS — I119 Hypertensive heart disease without heart failure: Secondary | ICD-10-CM | POA: Diagnosis not present

## 2021-07-22 DIAGNOSIS — Z48812 Encounter for surgical aftercare following surgery on the circulatory system: Secondary | ICD-10-CM | POA: Diagnosis not present

## 2021-07-22 DIAGNOSIS — Z452 Encounter for adjustment and management of vascular access device: Secondary | ICD-10-CM | POA: Diagnosis not present

## 2021-07-22 DIAGNOSIS — I088 Other rheumatic multiple valve diseases: Secondary | ICD-10-CM | POA: Diagnosis not present

## 2021-07-22 DIAGNOSIS — A419 Sepsis, unspecified organism: Secondary | ICD-10-CM | POA: Diagnosis not present

## 2021-07-22 DIAGNOSIS — I4510 Unspecified right bundle-branch block: Secondary | ICD-10-CM | POA: Diagnosis not present

## 2021-07-22 DIAGNOSIS — I358 Other nonrheumatic aortic valve disorders: Secondary | ICD-10-CM | POA: Diagnosis not present

## 2021-07-23 DIAGNOSIS — I358 Other nonrheumatic aortic valve disorders: Secondary | ICD-10-CM | POA: Diagnosis not present

## 2021-07-23 DIAGNOSIS — A419 Sepsis, unspecified organism: Secondary | ICD-10-CM | POA: Diagnosis not present

## 2021-07-23 DIAGNOSIS — I330 Acute and subacute infective endocarditis: Secondary | ICD-10-CM | POA: Diagnosis not present

## 2021-07-24 ENCOUNTER — Ambulatory Visit (INDEPENDENT_AMBULATORY_CARE_PROVIDER_SITE_OTHER): Payer: Self-pay | Admitting: Physician Assistant

## 2021-07-24 ENCOUNTER — Ambulatory Visit
Admission: RE | Admit: 2021-07-24 | Discharge: 2021-07-24 | Disposition: A | Discharge status: Home / Self Care | Payer: BC Managed Care – PPO | Source: Ambulatory Visit | Attending: Cardiothoracic Surgery | Admitting: Cardiothoracic Surgery

## 2021-07-24 ENCOUNTER — Other Ambulatory Visit: Payer: Self-pay

## 2021-07-24 VITALS — BP 119/74 | HR 86 | Resp 20 | Ht 71.0 in | Wt 224.0 lb

## 2021-07-24 DIAGNOSIS — I358 Other nonrheumatic aortic valve disorders: Secondary | ICD-10-CM | POA: Diagnosis not present

## 2021-07-24 DIAGNOSIS — Z9889 Other specified postprocedural states: Secondary | ICD-10-CM

## 2021-07-24 DIAGNOSIS — A419 Sepsis, unspecified organism: Secondary | ICD-10-CM | POA: Diagnosis not present

## 2021-07-24 DIAGNOSIS — I517 Cardiomegaly: Secondary | ICD-10-CM | POA: Diagnosis not present

## 2021-07-24 DIAGNOSIS — I330 Acute and subacute infective endocarditis: Secondary | ICD-10-CM | POA: Diagnosis not present

## 2021-07-24 NOTE — Progress Notes (Signed)
? ?   ?[address] ?      [name] [address] ?            [phone]   ? ?  ? ?HPI: ? ?Patient returns for routine postoperative follow-up having undergone Homograft root replacement on 07/05/21. The patient's early postoperative recovery while in the hospital was as expected.  He required 6 weeks of IV antibiotics.  Since hospital discharge the patient reports overall he is doing well.  He has about 3 more weeks of antibiotics left.  He has not had any further issues with drainage from his sternotomy and overall it is healing well.  He denies fevers, chills, sweats.  He is ambulating several times per day w/o difficulty.  He is no longer taking pain medication.  All questions were addressed. ? ?Current Outpatient Medications  ?Medication Sig Dispense Refill  ? acetaminophen (TYLENOL) 325 MG tablet Take 650 mg by mouth every 6 (six) hours as needed for mild pain or headache.    ? Ascorbic Acid (VITAMIN C PO) Take 1 tablet by mouth daily.    ? aspirin EC 81 MG EC tablet Take 1 tablet (81 mg total) by mouth daily. Swallow whole. 30 tablet 11  ? cefTRIAXone (ROCEPHIN) IVPB Inject 2 g into the vein daily. 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 37 Units 0  ? daptomycin (CUBICIN) IVPB Inject 900 mg into the vein daily. Indication:  Culture Negative AV Endocarditis ?First Dose: Yes ?Last Day of Therapy:  08/15/21 ?Labs - Once weekly:  CBC/D, BMP, and CPK ?Labs - Every other week:  ESR and CRP ?Method of administration: IV Push ?Method of administration may be changed at the discretion of home infusion pharmacist based upon assessment of the patient  and/or caregiver's ability to self-administer the medication ordered. 37 Units 0  ? losartan (COZAAR) 25 MG tablet Take 1 tablet (25 mg total) by mouth daily. 30 tablet 5  ? metoprolol succinate (TOPROL-XL) 100 MG 24 hr tablet Take 1 tablet (100 mg total) by mouth daily. Take with or immediately following a meal. 30 tablet 5  ? pantoprazole (PROTONIX) 20 MG tablet Take 1 tablet (20 mg total) by mouth daily. 30 tablet 0  ? Pyridoxine HCl (VITAMIN B-6 PO) Take 1 tablet by mouth daily.    ? furosemide (LASIX) 40 MG tablet Take 1 tablet (40 mg total) by mouth daily for 5 days. 5 tablet 0  ? potassium chloride SA (KLOR-CON M) 20 MEQ tablet Take 1 tablet (20 mEq total) by mouth daily for 5 days. 5 tablet 0  ? ?No current facility-administered medications for this visit.  ? ? ?Physical Exam: ? ?BP 119/74   Pulse 86   Resp 20   Ht 5' 11"  (1.803 m)   Wt 224 lb (101.6 kg)   SpO2 95% Comment: RA  BMI 31.24 kg/m?  ? ?Gen: no apparent distress ?Heart: RRR ?Lungs: CTA bilaterally ?Ext: no edema ?Incisions: well healed ? ?Diagnostic Tests: ? ?CXR: no pneumothorax, no pleural effusions, sternal wires intact ? ?A/P: ? ?S/P Homograft Aortic Root Replacement- doing very well ?ID- Endocarditis- on ABX per ID recommendations, about 3  more weeks of therapy, pathology remains pending ?May resume driving ?Activity-okay to increase as tolerated, refrain from heavy lifting with UE over 8-10 until 10-12 weeks from date of surgery ?RTC in 6 months with repeat CTA chest ? ? ?Ellwood Handler, PA-C ?[facility] ?[phone] ? ?

## 2021-07-25 ENCOUNTER — Encounter (HOSPITAL_COMMUNITY): Payer: Self-pay | Admitting: Surgery

## 2021-07-25 DIAGNOSIS — A419 Sepsis, unspecified organism: Secondary | ICD-10-CM | POA: Diagnosis not present

## 2021-07-25 DIAGNOSIS — I330 Acute and subacute infective endocarditis: Secondary | ICD-10-CM | POA: Diagnosis not present

## 2021-07-25 DIAGNOSIS — I358 Other nonrheumatic aortic valve disorders: Secondary | ICD-10-CM | POA: Diagnosis not present

## 2021-07-26 DIAGNOSIS — I330 Acute and subacute infective endocarditis: Secondary | ICD-10-CM | POA: Diagnosis not present

## 2021-07-26 DIAGNOSIS — I358 Other nonrheumatic aortic valve disorders: Secondary | ICD-10-CM | POA: Diagnosis not present

## 2021-07-26 DIAGNOSIS — A419 Sepsis, unspecified organism: Secondary | ICD-10-CM | POA: Diagnosis not present

## 2021-07-27 DIAGNOSIS — I330 Acute and subacute infective endocarditis: Secondary | ICD-10-CM | POA: Diagnosis not present

## 2021-07-27 DIAGNOSIS — A419 Sepsis, unspecified organism: Secondary | ICD-10-CM | POA: Diagnosis not present

## 2021-07-27 DIAGNOSIS — I358 Other nonrheumatic aortic valve disorders: Secondary | ICD-10-CM | POA: Diagnosis not present

## 2021-07-28 DIAGNOSIS — I358 Other nonrheumatic aortic valve disorders: Secondary | ICD-10-CM | POA: Diagnosis not present

## 2021-07-28 DIAGNOSIS — A419 Sepsis, unspecified organism: Secondary | ICD-10-CM | POA: Diagnosis not present

## 2021-07-28 DIAGNOSIS — I330 Acute and subacute infective endocarditis: Secondary | ICD-10-CM | POA: Diagnosis not present

## 2021-07-29 DIAGNOSIS — Z792 Long term (current) use of antibiotics: Secondary | ICD-10-CM | POA: Diagnosis not present

## 2021-07-29 DIAGNOSIS — I358 Other nonrheumatic aortic valve disorders: Secondary | ICD-10-CM | POA: Diagnosis not present

## 2021-07-29 DIAGNOSIS — Z48812 Encounter for surgical aftercare following surgery on the circulatory system: Secondary | ICD-10-CM | POA: Diagnosis not present

## 2021-07-29 DIAGNOSIS — Z9181 History of falling: Secondary | ICD-10-CM | POA: Diagnosis not present

## 2021-07-29 DIAGNOSIS — Z452 Encounter for adjustment and management of vascular access device: Secondary | ICD-10-CM | POA: Diagnosis not present

## 2021-07-29 DIAGNOSIS — A419 Sepsis, unspecified organism: Secondary | ICD-10-CM | POA: Diagnosis not present

## 2021-07-29 DIAGNOSIS — I119 Hypertensive heart disease without heart failure: Secondary | ICD-10-CM | POA: Diagnosis not present

## 2021-07-29 DIAGNOSIS — Z79891 Long term (current) use of opiate analgesic: Secondary | ICD-10-CM | POA: Diagnosis not present

## 2021-07-29 DIAGNOSIS — L732 Hidradenitis suppurativa: Secondary | ICD-10-CM | POA: Diagnosis not present

## 2021-07-29 DIAGNOSIS — L0592 Pilonidal sinus without abscess: Secondary | ICD-10-CM | POA: Diagnosis not present

## 2021-07-29 DIAGNOSIS — I088 Other rheumatic multiple valve diseases: Secondary | ICD-10-CM | POA: Diagnosis not present

## 2021-07-29 DIAGNOSIS — I4510 Unspecified right bundle-branch block: Secondary | ICD-10-CM | POA: Diagnosis not present

## 2021-07-29 DIAGNOSIS — Z952 Presence of prosthetic heart valve: Secondary | ICD-10-CM | POA: Diagnosis not present

## 2021-07-29 DIAGNOSIS — I330 Acute and subacute infective endocarditis: Secondary | ICD-10-CM | POA: Diagnosis not present

## 2021-07-29 DIAGNOSIS — J9811 Atelectasis: Secondary | ICD-10-CM | POA: Diagnosis not present

## 2021-07-30 ENCOUNTER — Encounter: Payer: Self-pay | Admitting: Internal Medicine

## 2021-07-30 ENCOUNTER — Other Ambulatory Visit: Payer: Self-pay

## 2021-07-30 ENCOUNTER — Ambulatory Visit (INDEPENDENT_AMBULATORY_CARE_PROVIDER_SITE_OTHER): Payer: BC Managed Care – PPO | Admitting: Internal Medicine

## 2021-07-30 VITALS — BP 116/64 | HR 71 | Temp 97.2°F | Wt 224.0 lb

## 2021-07-30 DIAGNOSIS — I330 Acute and subacute infective endocarditis: Secondary | ICD-10-CM | POA: Diagnosis not present

## 2021-07-30 DIAGNOSIS — I358 Other nonrheumatic aortic valve disorders: Secondary | ICD-10-CM

## 2021-07-30 DIAGNOSIS — Z5181 Encounter for therapeutic drug level monitoring: Secondary | ICD-10-CM

## 2021-07-30 DIAGNOSIS — Z452 Encounter for adjustment and management of vascular access device: Secondary | ICD-10-CM | POA: Diagnosis not present

## 2021-07-30 DIAGNOSIS — A419 Sepsis, unspecified organism: Secondary | ICD-10-CM | POA: Diagnosis not present

## 2021-07-31 ENCOUNTER — Encounter: Payer: Self-pay | Admitting: Internal Medicine

## 2021-07-31 DIAGNOSIS — Z452 Encounter for adjustment and management of vascular access device: Secondary | ICD-10-CM | POA: Insufficient documentation

## 2021-07-31 DIAGNOSIS — I330 Acute and subacute infective endocarditis: Secondary | ICD-10-CM | POA: Diagnosis not present

## 2021-07-31 DIAGNOSIS — I358 Other nonrheumatic aortic valve disorders: Secondary | ICD-10-CM | POA: Diagnosis not present

## 2021-07-31 DIAGNOSIS — Z5181 Encounter for therapeutic drug level monitoring: Secondary | ICD-10-CM | POA: Insufficient documentation

## 2021-07-31 DIAGNOSIS — A419 Sepsis, unspecified organism: Secondary | ICD-10-CM | POA: Diagnosis not present

## 2021-07-31 NOTE — Progress Notes (Signed)
? ?  Subjective:  ? ? Patient ID: Martin Stafford, male    DOB: 08/05/1990, 31 y.o.   MRN: [id number] ? 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 ?He is s/p aortic valve replacement on 07/05/21 by Dr. Donata Clay utilizing a homograft and was discharged on 07/12/21 on empiric daptomcyin and ceftriaxone with negative blood and tissue cultures.  Tissue was also sent to the [facility] though unable to run PCR there so nothing positive.  He is doing well, increasing activity.  No fever, no chills.   ? ? ?Review of Systems  ?Constitutional:  Positive for fatigue. Negative for chills and fever.  ?     Improving activity overall  ?Gastrointestinal:  Negative for diarrhea and nausea.  ?Skin:  Negative for rash.  ? ?   ?Objective:  ? Physical Exam ?Eyes:  ?   General: No scleral icterus. ?Cardiovascular:  ?   Rate and Rhythm: Normal rate and regular rhythm.  ?Pulmonary:  ?   Effort: Pulmonary effort is normal. No respiratory distress.  ?Skin: ?   Findings: No rash.  ?Neurological:  ?   Mental Status: He is alert.  ?Psychiatric:     ?   Mood and Affect: Mood normal.  ? ?SH: no tobacco ? ? ? ?   ?Assessment & Plan:  ? ? ?

## 2021-07-31 NOTE — Assessment & Plan Note (Signed)
picc line in place and working well, no concerns ?This will be removed by home health at the end of treatment.  ?

## 2021-07-31 NOTE — Assessment & Plan Note (Addendum)
No positive cultures but clinically he is doing well with no concerns from an ID standpoint.  He will continue with the daptomycin and ceftriaxone for 6 weeks post op and plan to stop after that.   ?He can return here as needed.  ?He will get a primary care doctor ?

## 2021-07-31 NOTE — Assessment & Plan Note (Signed)
Labs reviewed with him, no issues with the CK, no myalgias or concerns.  Other labs without significant abnormalities.  Will continue to monitor while on antibiotics.   ?

## 2021-08-01 ENCOUNTER — Ambulatory Visit (INDEPENDENT_AMBULATORY_CARE_PROVIDER_SITE_OTHER): Payer: BC Managed Care – PPO | Admitting: Nurse Practitioner

## 2021-08-01 ENCOUNTER — Other Ambulatory Visit: Payer: Self-pay

## 2021-08-01 ENCOUNTER — Encounter: Payer: Self-pay | Admitting: Nurse Practitioner

## 2021-08-01 VITALS — BP 120/60 | HR 75 | Ht 71.0 in | Wt 223.0 lb

## 2021-08-01 DIAGNOSIS — A419 Sepsis, unspecified organism: Secondary | ICD-10-CM | POA: Diagnosis not present

## 2021-08-01 DIAGNOSIS — I358 Other nonrheumatic aortic valve disorders: Secondary | ICD-10-CM

## 2021-08-01 DIAGNOSIS — L0591 Pilonidal cyst without abscess: Secondary | ICD-10-CM

## 2021-08-01 DIAGNOSIS — I330 Acute and subacute infective endocarditis: Secondary | ICD-10-CM | POA: Diagnosis not present

## 2021-08-01 DIAGNOSIS — Z9889 Other specified postprocedural states: Secondary | ICD-10-CM | POA: Diagnosis not present

## 2021-08-01 DIAGNOSIS — I10 Essential (primary) hypertension: Secondary | ICD-10-CM

## 2021-08-01 DIAGNOSIS — R000 Tachycardia, unspecified: Secondary | ICD-10-CM

## 2021-08-01 MED ORDER — AMOXICILLIN 500 MG PO TABS: ORAL_TABLET | ORAL | 3 refills | Status: DC

## 2021-08-01 NOTE — Patient Instructions (Signed)
Medication Instructions:  ?Take amoxicillin 2 grams (4 of the 500 mg tablets) 30 - 60 minutes prior to dental procedure. ? ?*If you need a refill on your cardiac medications before your next appointment, please call your pharmacy* ? ? ?Follow-Up: ?At [facility], you and your health needs are our priority.  As part of our continuing mission to provide you with exceptional heart care, we have created designated Provider Care Teams.  These Care Teams include your primary Cardiologist (physician) and Advanced Practice Providers (APPs -  Physician Assistants and Nurse Practitioners) who all work together to provide you with the care you need, when you need it. ? ?We recommend signing up for the patient portal called "MyChart".  Sign up information is provided on this After Visit Summary.  MyChart is used to connect with patients for Virtual Visits (Telemedicine).  Patients are able to view lab/test results, encounter notes, upcoming appointments, etc.  Non-urgent messages can be sent to your provider as well.   ?To learn more about what you can do with MyChart, go to [email or web address].   ? ?Your next appointment:   ?3 month(s) ? ?The format for your next appointment:   ?In Person ? ?Provider:   ?Thurmon Fair, MD or Bernadene Person, NP ? ? ? ?

## 2021-08-01 NOTE — Progress Notes (Addendum)
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  Past Medical History    Past Medical History:  Diagnosis Date   Environmental allergies    Generalized skin cysts    Past Surgical History:  Procedure Laterality Date   ASCENDING AORTIC ROOT REPLACEMENT N/A 07/05/2021   Procedure: Homograft aortic root replacement using 25mm Cryo Aortic Valve.;  Surgeon: Lovett Sox, MD;  Location: [facility] OR;  Service: Open Heart Surgery;  Laterality: N/A;   COLON SURGERY     PILONIDAL CYST DRAINAGE N/A 07/03/2021   Procedure: EXCISIONAL DEBRIDEMENT CHRONIC PILONIDAL TRACT;  Surgeon: Abigail Miyamoto, MD;  Location: [facility] OR;  Service: General;  Laterality: N/A;   TEE WITHOUT CARDIOVERSION N/A 07/01/2021   Procedure: TRANSESOPHAGEAL ECHOCARDIOGRAM (TEE);  Surgeon: Pricilla Riffle, MD;  Location: [facility] ENDOSCOPY;  Service: Cardiovascular;  Laterality: N/A;   TEE WITHOUT CARDIOVERSION N/A 07/05/2021   Procedure: TRANSESOPHAGEAL ECHOCARDIOGRAM (TEE);  Surgeon: Lovett Sox, MD;  Location: [facility] OR;  Service: Open Heart Surgery;  Laterality: N/A;    Allergies  Allergies  Allergen Reactions   Eggs Or Egg-Derived Products     "eggs"   Peanut-Containing Drug Products     "peanuts"    History of Present Illness    31 year old male with the above past medical history including aortic valve endocarditis, 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  Previously healthy, he presented to the ED on 06/27/2021 with complaints of chest pain, shortness of breath, fever and chills.  Chest x-ray was unremarkable, troponin was elevated. Cardiology was consulted  in the setting of new systolic and diastolic murmur. Echocardiogram showed EF 55 to 60%, moderate LVH, G1 DD, severe aortic regurgitation, no distinct vegetations or aortic dissection. CT angio aorta showed no evidence of aortic aneurysm or aortic dissection. TEE showed severe aortic valve regurgitation, abnormally thickened noncoronary cusp with aortic valve which appeared fenestrated, likely representative of abscess formation. 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 He underwent surgical excision and placement of wound VAC. He was hospitalized from 06/27/2021 to 07/12/2021 in the setting of infective endocarditis. Infectious disease was consulted and he was treated with IV antibiotics for infective endocarditis.  Coronary CTA showed calcium score of 0. Carotid Dopplers Dopplers were normal. CT surgery was consulted and he underwent homograft aortic root replacement using a 25 mm cryopreserved aortic root LifeNet, and debridement and closure of subannular abscess on 07/05/2021.  He was started on metoprolol in the setting of tachycardia and losartan for elevated BP.  He was discharged home in stable condition on 07/12/2021.  He saw cardiothoracic surgery and infectious disease and post hospital follow-up was stable.  Follow-up with CT surgery in 6 months was recommended for repeat CT.    He presents today for follow-up accompanied by his fiancee.  Since his hospitalization he has done well from a cardiac standpoint.  He is gradually increasing his activity and seems to be tolerating this well.  He does report some generalized fatigue, he thinks this may be in the setting of metoprolol. He is asking for the possibility of  dose reduction in the future. He denies dyspnea, edema, weight gain, PND, orthopnea, denies fevers.  Overall, he reports feeling well and denies any additional concerns or complaints today.  Home  Medications    Current Outpatient Medications  Medication Sig Dispense Refill   acetaminophen (TYLENOL) 325 MG tablet Take 650 mg by mouth every 6 (six) hours as needed for mild pain or headache.     amoxicillin (AMOXIL) 500 MG tablet Take 4 tablets (2 grams) 30-60 minutes prior to dental procedure. 4 tablet 3   aspirin EC 81 MG EC tablet Take 1 tablet (81 mg total) by mouth daily. Swallow whole. 30 tablet 11   cefTRIAXone (ROCEPHIN) IVPB Inject 2 g into the vein daily. 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 37 Units 0   daptomycin (CUBICIN) IVPB Inject 900 mg into the vein daily. Indication:  Culture Negative AV Endocarditis First Dose: Yes Last Day of Therapy:  08/15/21 Labs - Once weekly:  CBC/D, BMP, and CPK Labs - Every other week:  ESR and CRP Method of administration: IV Push Method of administration may be changed at the discretion of home infusion pharmacist based upon assessment of the patient and/or caregiver's ability to self-administer the medication ordered. 37 Units 0   losartan (COZAAR) 25 MG tablet Take 1 tablet (25 mg total) by mouth daily. 30 tablet 5   metoprolol succinate (TOPROL-XL) 100 MG 24 hr tablet Take 1 tablet (100 mg total) by mouth daily. Take with or immediately following a meal. 30 tablet 5   pantoprazole (PROTONIX) 20 MG tablet Take 1 tablet (20 mg total) by mouth daily. 30 tablet 0   Pyridoxine HCl (VITAMIN B-6 PO) Take 1 tablet by mouth daily.     Ascorbic Acid (VITAMIN C PO) Take 1 tablet by mouth daily. (Patient not taking: Reported on 08/01/2021)     No current facility-administered medications for this  visit.     Review of Systems    He denies chest pain, palpitations, dyspnea, pnd, orthopnea, n, v, dizziness, syncope, edema, weight gain, or early satiety. All other systems reviewed and are otherwise negative except as noted above.     Cardiac Rehabilitation Eligibility Assessment  The patient is ready to start cardiac rehabilitation pending clearance from the cardiac surgeon.    Physical Exam    VS:  BP 120/60 (BP Location: Left Arm, Patient Position: Sitting, Cuff Size: Normal)   Pulse 75   Ht 5\' 11"  (1.803 m)   Wt 223 lb (101.2 kg)   SpO2 95%   BMI 31.10 kg/m   GEN: Well nourished, well developed, in no acute distress. HEENT: normal. Neck: Supple, no JVD, carotid bruits, or masses. Cardiac: RRR, no murmurs, rubs, or gallops. No clubbing, cyanosis, edema.  Radials/DP/PT 2+ and equal bilaterally.  Respiratory:  Respirations regular and unlabored, clear to auscultation bilaterally. GI: Soft, nontender, nondistended, BS + x 4. MS: no deformity or atrophy. Skin: warm and dry, no rash.  Sternotomy incision clean, dry, intact and well approximated. Neuro:  Strength and sensation are intact. Psych: Normal affect.  Accessory Clinical Findings    ECG personally reviewed by me today - No EKG in  office today.  Lab Results  Component Value Date   WBC 12.4 (H) 07/12/2021   HGB 10.5 (L) 07/12/2021   HCT 32.1 (L) 07/12/2021   MCV 81.5 07/12/2021   PLT 513 (H) 07/12/2021   Lab Results  Component Value Date   CREATININE 0.73 07/12/2021   BUN 16 07/12/2021   NA 129 (L) 07/12/2021   K 3.8 07/12/2021   CL 92 (L) 07/12/2021   CO2 27 07/12/2021   Lab Results  Component Value Date   ALT 55 (H) 07/10/2021   AST 41 07/10/2021   ALKPHOS 53 07/10/2021   BILITOT 0.6 07/10/2021   No results found for: CHOL, HDL, LDLCALC, LDLDIRECT, TRIG, CHOLHDL  Lab Results  Component Value Date   HGBA1C 5.3 07/04/2021    Assessment & Plan    1. Aortic valve endocarditis/subannular  abcess: S/p homograft aortic root replacement using a 25 mm cryopreserved aortic root LifeNet, and debridement and closure of subannular abscess on 07/05/2021.  43-month follow-up CT recommended per CT surgery. Euvolemic and well compensated on exam. Will likely need repeat echocardiogram in future, will discuss with primary cardiologist. Discussed the use of prophylactic antibiotics before dental work and other surgeries. Prescription given for Amoxicillin 2 grams orally one hour before procedure.  Continue antibiotics per ID, follow-up with ID as planned.  Follow-up with CT surgery as scheduled.  2. Hypertension: BP well controlled. Continue current antihypertensive regimen.    3. Tachycardia: HR was elevated during recent hospitalization.  Improved overall, HR 75 bpm in office today.  He denies palpitations. Continue metoprolol. He has noticed some fatigue with beta-blocker, continue to monitor, consider dose reduction in future.   4. Pilonidal cyst: S/p surgical incision and wound VAC (since removed).  No complications. Follow-up with general surgery.  5.Disposition: F/u in 2-3 months.   Joylene Grapes, NP 08/01/2021, 12:46 PM   Addendum 08/02/2021: Following review by Dr. Royann Shivers, primary cardiologist, will order repeat echocardiogram to be performed in the next 2-3 weeks for routine monitoring s/p homograft aortic root replacement with debridement and closure of subannular abscess.   Joylene Grapes, NP 08/02/21, 9:59 AM

## 2021-08-02 DIAGNOSIS — A419 Sepsis, unspecified organism: Secondary | ICD-10-CM | POA: Diagnosis not present

## 2021-08-02 DIAGNOSIS — I358 Other nonrheumatic aortic valve disorders: Secondary | ICD-10-CM | POA: Diagnosis not present

## 2021-08-02 DIAGNOSIS — I330 Acute and subacute infective endocarditis: Secondary | ICD-10-CM | POA: Diagnosis not present

## 2021-08-03 DIAGNOSIS — I330 Acute and subacute infective endocarditis: Secondary | ICD-10-CM | POA: Diagnosis not present

## 2021-08-03 DIAGNOSIS — I358 Other nonrheumatic aortic valve disorders: Secondary | ICD-10-CM | POA: Diagnosis not present

## 2021-08-03 DIAGNOSIS — A419 Sepsis, unspecified organism: Secondary | ICD-10-CM | POA: Diagnosis not present

## 2021-08-04 DIAGNOSIS — I330 Acute and subacute infective endocarditis: Secondary | ICD-10-CM | POA: Diagnosis not present

## 2021-08-04 DIAGNOSIS — A419 Sepsis, unspecified organism: Secondary | ICD-10-CM | POA: Diagnosis not present

## 2021-08-04 DIAGNOSIS — I358 Other nonrheumatic aortic valve disorders: Secondary | ICD-10-CM | POA: Diagnosis not present

## 2021-08-05 DIAGNOSIS — A419 Sepsis, unspecified organism: Secondary | ICD-10-CM | POA: Diagnosis not present

## 2021-08-05 DIAGNOSIS — I358 Other nonrheumatic aortic valve disorders: Secondary | ICD-10-CM | POA: Diagnosis not present

## 2021-08-05 DIAGNOSIS — I330 Acute and subacute infective endocarditis: Secondary | ICD-10-CM | POA: Diagnosis not present

## 2021-08-06 ENCOUNTER — Telehealth: Payer: Self-pay

## 2021-08-06 ENCOUNTER — Telehealth: Payer: Self-pay | Admitting: *Deleted

## 2021-08-06 ENCOUNTER — Encounter: Payer: Self-pay | Admitting: *Deleted

## 2021-08-06 DIAGNOSIS — J9811 Atelectasis: Secondary | ICD-10-CM | POA: Diagnosis not present

## 2021-08-06 DIAGNOSIS — Z9181 History of falling: Secondary | ICD-10-CM | POA: Diagnosis not present

## 2021-08-06 DIAGNOSIS — Z792 Long term (current) use of antibiotics: Secondary | ICD-10-CM | POA: Diagnosis not present

## 2021-08-06 DIAGNOSIS — L732 Hidradenitis suppurativa: Secondary | ICD-10-CM | POA: Diagnosis not present

## 2021-08-06 DIAGNOSIS — L0592 Pilonidal sinus without abscess: Secondary | ICD-10-CM | POA: Diagnosis not present

## 2021-08-06 DIAGNOSIS — Z952 Presence of prosthetic heart valve: Secondary | ICD-10-CM | POA: Diagnosis not present

## 2021-08-06 DIAGNOSIS — I358 Other nonrheumatic aortic valve disorders: Secondary | ICD-10-CM | POA: Diagnosis not present

## 2021-08-06 DIAGNOSIS — I088 Other rheumatic multiple valve diseases: Secondary | ICD-10-CM | POA: Diagnosis not present

## 2021-08-06 DIAGNOSIS — I330 Acute and subacute infective endocarditis: Secondary | ICD-10-CM | POA: Diagnosis not present

## 2021-08-06 DIAGNOSIS — A419 Sepsis, unspecified organism: Secondary | ICD-10-CM | POA: Diagnosis not present

## 2021-08-06 DIAGNOSIS — I119 Hypertensive heart disease without heart failure: Secondary | ICD-10-CM | POA: Diagnosis not present

## 2021-08-06 DIAGNOSIS — Z48812 Encounter for surgical aftercare following surgery on the circulatory system: Secondary | ICD-10-CM | POA: Diagnosis not present

## 2021-08-06 DIAGNOSIS — Z452 Encounter for adjustment and management of vascular access device: Secondary | ICD-10-CM | POA: Diagnosis not present

## 2021-08-06 DIAGNOSIS — Z79891 Long term (current) use of opiate analgesic: Secondary | ICD-10-CM | POA: Diagnosis not present

## 2021-08-06 DIAGNOSIS — I4510 Unspecified right bundle-branch block: Secondary | ICD-10-CM | POA: Diagnosis not present

## 2021-08-06 NOTE — Telephone Encounter (Signed)
-----   Message from Lovett Sox, MD sent at 08/06/2021  4:01 PM EDT ----- ?Regarding: RE: Driving restrictions ?Thanks  ?Yes he can drive in town ?----- Message ----- ?From: Ludwig Clarks, RN ?Sent: 08/06/2021   3:29 PM EDT ?To: Lovett Sox, MD ?Subject: Driving restrictions                          ? ?Hey Dr. Donata Clay, ? ?Mr. Kunsman called to check on whether he could resume driving. He saw Denny Peon 3/15 and you caught the patient as he was leaving and told him to refrain from driving for a while longer. Patient states he is feeling great and is only taking Tylenol for pain. He has seen Infectious Disease and Cardiology since his appt here. May he resume driving? Please advise. ? ?Thanks,  ?Clarkson Rosselli   ? ?

## 2021-08-06 NOTE — Telephone Encounter (Signed)
Spoke with pt and pt is aware that a repeat Echo is recommended per Bernadene Person, NP. Order placed for Echo. ?

## 2021-08-07 DIAGNOSIS — I358 Other nonrheumatic aortic valve disorders: Secondary | ICD-10-CM | POA: Diagnosis not present

## 2021-08-07 DIAGNOSIS — A419 Sepsis, unspecified organism: Secondary | ICD-10-CM | POA: Diagnosis not present

## 2021-08-07 DIAGNOSIS — I330 Acute and subacute infective endocarditis: Secondary | ICD-10-CM | POA: Diagnosis not present

## 2021-08-08 DIAGNOSIS — A419 Sepsis, unspecified organism: Secondary | ICD-10-CM | POA: Diagnosis not present

## 2021-08-08 DIAGNOSIS — I358 Other nonrheumatic aortic valve disorders: Secondary | ICD-10-CM | POA: Diagnosis not present

## 2021-08-08 DIAGNOSIS — I330 Acute and subacute infective endocarditis: Secondary | ICD-10-CM | POA: Diagnosis not present

## 2021-08-09 DIAGNOSIS — I330 Acute and subacute infective endocarditis: Secondary | ICD-10-CM | POA: Diagnosis not present

## 2021-08-09 DIAGNOSIS — A419 Sepsis, unspecified organism: Secondary | ICD-10-CM | POA: Diagnosis not present

## 2021-08-09 DIAGNOSIS — I358 Other nonrheumatic aortic valve disorders: Secondary | ICD-10-CM | POA: Diagnosis not present

## 2021-08-10 DIAGNOSIS — I330 Acute and subacute infective endocarditis: Secondary | ICD-10-CM | POA: Diagnosis not present

## 2021-08-10 DIAGNOSIS — A419 Sepsis, unspecified organism: Secondary | ICD-10-CM | POA: Diagnosis not present

## 2021-08-10 DIAGNOSIS — I358 Other nonrheumatic aortic valve disorders: Secondary | ICD-10-CM | POA: Diagnosis not present

## 2021-08-11 DIAGNOSIS — I358 Other nonrheumatic aortic valve disorders: Secondary | ICD-10-CM | POA: Diagnosis not present

## 2021-08-11 DIAGNOSIS — A419 Sepsis, unspecified organism: Secondary | ICD-10-CM | POA: Diagnosis not present

## 2021-08-11 DIAGNOSIS — I330 Acute and subacute infective endocarditis: Secondary | ICD-10-CM | POA: Diagnosis not present

## 2021-08-12 ENCOUNTER — Encounter: Payer: Self-pay | Admitting: Internal Medicine

## 2021-08-12 DIAGNOSIS — J9811 Atelectasis: Secondary | ICD-10-CM | POA: Diagnosis not present

## 2021-08-12 DIAGNOSIS — A419 Sepsis, unspecified organism: Secondary | ICD-10-CM | POA: Diagnosis not present

## 2021-08-12 DIAGNOSIS — Z9181 History of falling: Secondary | ICD-10-CM | POA: Diagnosis not present

## 2021-08-12 DIAGNOSIS — I119 Hypertensive heart disease without heart failure: Secondary | ICD-10-CM | POA: Diagnosis not present

## 2021-08-12 DIAGNOSIS — I088 Other rheumatic multiple valve diseases: Secondary | ICD-10-CM | POA: Diagnosis not present

## 2021-08-12 DIAGNOSIS — L0592 Pilonidal sinus without abscess: Secondary | ICD-10-CM | POA: Diagnosis not present

## 2021-08-12 DIAGNOSIS — Z48812 Encounter for surgical aftercare following surgery on the circulatory system: Secondary | ICD-10-CM | POA: Diagnosis not present

## 2021-08-12 DIAGNOSIS — I358 Other nonrheumatic aortic valve disorders: Secondary | ICD-10-CM | POA: Diagnosis not present

## 2021-08-12 DIAGNOSIS — Z79891 Long term (current) use of opiate analgesic: Secondary | ICD-10-CM | POA: Diagnosis not present

## 2021-08-12 DIAGNOSIS — L732 Hidradenitis suppurativa: Secondary | ICD-10-CM | POA: Diagnosis not present

## 2021-08-12 DIAGNOSIS — Z452 Encounter for adjustment and management of vascular access device: Secondary | ICD-10-CM | POA: Diagnosis not present

## 2021-08-12 DIAGNOSIS — Z952 Presence of prosthetic heart valve: Secondary | ICD-10-CM | POA: Diagnosis not present

## 2021-08-12 DIAGNOSIS — I330 Acute and subacute infective endocarditis: Secondary | ICD-10-CM | POA: Diagnosis not present

## 2021-08-12 DIAGNOSIS — Z792 Long term (current) use of antibiotics: Secondary | ICD-10-CM | POA: Diagnosis not present

## 2021-08-12 DIAGNOSIS — I4510 Unspecified right bundle-branch block: Secondary | ICD-10-CM | POA: Diagnosis not present

## 2021-08-13 DIAGNOSIS — I358 Other nonrheumatic aortic valve disorders: Secondary | ICD-10-CM | POA: Diagnosis not present

## 2021-08-13 DIAGNOSIS — I330 Acute and subacute infective endocarditis: Secondary | ICD-10-CM | POA: Diagnosis not present

## 2021-08-13 DIAGNOSIS — A419 Sepsis, unspecified organism: Secondary | ICD-10-CM | POA: Diagnosis not present

## 2021-08-14 DIAGNOSIS — I358 Other nonrheumatic aortic valve disorders: Secondary | ICD-10-CM | POA: Diagnosis not present

## 2021-08-14 DIAGNOSIS — I330 Acute and subacute infective endocarditis: Secondary | ICD-10-CM | POA: Diagnosis not present

## 2021-08-14 DIAGNOSIS — A419 Sepsis, unspecified organism: Secondary | ICD-10-CM | POA: Diagnosis not present

## 2021-08-15 DIAGNOSIS — I358 Other nonrheumatic aortic valve disorders: Secondary | ICD-10-CM | POA: Diagnosis not present

## 2021-08-15 DIAGNOSIS — A419 Sepsis, unspecified organism: Secondary | ICD-10-CM | POA: Diagnosis not present

## 2021-08-15 DIAGNOSIS — I330 Acute and subacute infective endocarditis: Secondary | ICD-10-CM | POA: Diagnosis not present

## 2021-08-19 ENCOUNTER — Ambulatory Visit (HOSPITAL_BASED_OUTPATIENT_CLINIC_OR_DEPARTMENT_OTHER): Payer: BC Managed Care – PPO

## 2021-08-19 DIAGNOSIS — Z953 Presence of xenogenic heart valve: Secondary | ICD-10-CM | POA: Diagnosis not present

## 2021-08-19 DIAGNOSIS — L732 Hidradenitis suppurativa: Secondary | ICD-10-CM | POA: Diagnosis not present

## 2021-08-19 DIAGNOSIS — I471 Supraventricular tachycardia: Secondary | ICD-10-CM | POA: Diagnosis not present

## 2021-08-19 DIAGNOSIS — I358 Other nonrheumatic aortic valve disorders: Secondary | ICD-10-CM | POA: Insufficient documentation

## 2021-08-19 DIAGNOSIS — L0592 Pilonidal sinus without abscess: Secondary | ICD-10-CM | POA: Diagnosis not present

## 2021-08-19 DIAGNOSIS — J9601 Acute respiratory failure with hypoxia: Secondary | ICD-10-CM | POA: Diagnosis not present

## 2021-08-19 DIAGNOSIS — D72829 Elevated white blood cell count, unspecified: Secondary | ICD-10-CM | POA: Diagnosis not present

## 2021-08-19 DIAGNOSIS — N281 Cyst of kidney, acquired: Secondary | ICD-10-CM | POA: Diagnosis not present

## 2021-08-19 DIAGNOSIS — K7200 Acute and subacute hepatic failure without coma: Secondary | ICD-10-CM | POA: Diagnosis not present

## 2021-08-19 DIAGNOSIS — Z8679 Personal history of other diseases of the circulatory system: Secondary | ICD-10-CM | POA: Diagnosis not present

## 2021-08-19 DIAGNOSIS — Z48812 Encounter for surgical aftercare following surgery on the circulatory system: Secondary | ICD-10-CM | POA: Diagnosis not present

## 2021-08-19 DIAGNOSIS — R000 Tachycardia, unspecified: Secondary | ICD-10-CM | POA: Diagnosis not present

## 2021-08-19 DIAGNOSIS — Z91010 Allergy to peanuts: Secondary | ICD-10-CM | POA: Diagnosis not present

## 2021-08-19 DIAGNOSIS — D509 Iron deficiency anemia, unspecified: Secondary | ICD-10-CM | POA: Diagnosis not present

## 2021-08-19 DIAGNOSIS — T827XXA Infection and inflammatory reaction due to other cardiac and vascular devices, implants and grafts, initial encounter: Secondary | ICD-10-CM | POA: Diagnosis not present

## 2021-08-19 DIAGNOSIS — R6521 Severe sepsis with septic shock: Secondary | ICD-10-CM | POA: Diagnosis not present

## 2021-08-19 DIAGNOSIS — Z8249 Family history of ischemic heart disease and other diseases of the circulatory system: Secondary | ICD-10-CM | POA: Diagnosis not present

## 2021-08-19 DIAGNOSIS — D684 Acquired coagulation factor deficiency: Secondary | ICD-10-CM | POA: Diagnosis not present

## 2021-08-19 DIAGNOSIS — J984 Other disorders of lung: Secondary | ICD-10-CM | POA: Diagnosis not present

## 2021-08-19 DIAGNOSIS — Z452 Encounter for adjustment and management of vascular access device: Secondary | ICD-10-CM | POA: Diagnosis not present

## 2021-08-19 DIAGNOSIS — I330 Acute and subacute infective endocarditis: Secondary | ICD-10-CM | POA: Diagnosis not present

## 2021-08-19 DIAGNOSIS — L0591 Pilonidal cyst without abscess: Secondary | ICD-10-CM | POA: Diagnosis not present

## 2021-08-19 DIAGNOSIS — Z952 Presence of prosthetic heart valve: Secondary | ICD-10-CM | POA: Diagnosis not present

## 2021-08-19 DIAGNOSIS — A419 Sepsis, unspecified organism: Secondary | ICD-10-CM | POA: Diagnosis not present

## 2021-08-19 DIAGNOSIS — N170 Acute kidney failure with tubular necrosis: Secondary | ICD-10-CM | POA: Diagnosis not present

## 2021-08-19 DIAGNOSIS — T826XXA Infection and inflammatory reaction due to cardiac valve prosthesis, initial encounter: Secondary | ICD-10-CM | POA: Diagnosis not present

## 2021-08-19 DIAGNOSIS — I10 Essential (primary) hypertension: Secondary | ICD-10-CM | POA: Diagnosis not present

## 2021-08-19 DIAGNOSIS — Z7982 Long term (current) use of aspirin: Secondary | ICD-10-CM | POA: Diagnosis not present

## 2021-08-19 DIAGNOSIS — R570 Cardiogenic shock: Secondary | ICD-10-CM | POA: Diagnosis not present

## 2021-08-19 DIAGNOSIS — J90 Pleural effusion, not elsewhere classified: Secondary | ICD-10-CM | POA: Diagnosis not present

## 2021-08-19 DIAGNOSIS — R630 Anorexia: Secondary | ICD-10-CM | POA: Diagnosis not present

## 2021-08-19 DIAGNOSIS — R109 Unspecified abdominal pain: Secondary | ICD-10-CM | POA: Diagnosis not present

## 2021-08-19 DIAGNOSIS — I119 Hypertensive heart disease without heart failure: Secondary | ICD-10-CM | POA: Diagnosis not present

## 2021-08-19 DIAGNOSIS — R918 Other nonspecific abnormal finding of lung field: Secondary | ICD-10-CM | POA: Diagnosis not present

## 2021-08-19 DIAGNOSIS — Z792 Long term (current) use of antibiotics: Secondary | ICD-10-CM | POA: Diagnosis not present

## 2021-08-19 DIAGNOSIS — I351 Nonrheumatic aortic (valve) insufficiency: Secondary | ICD-10-CM | POA: Diagnosis not present

## 2021-08-19 DIAGNOSIS — D62 Acute posthemorrhagic anemia: Secondary | ICD-10-CM | POA: Diagnosis not present

## 2021-08-19 DIAGNOSIS — Z9181 History of falling: Secondary | ICD-10-CM | POA: Diagnosis not present

## 2021-08-19 DIAGNOSIS — J9811 Atelectasis: Secondary | ICD-10-CM | POA: Diagnosis not present

## 2021-08-19 DIAGNOSIS — E876 Hypokalemia: Secondary | ICD-10-CM | POA: Diagnosis not present

## 2021-08-19 DIAGNOSIS — Z66 Do not resuscitate: Secondary | ICD-10-CM | POA: Diagnosis not present

## 2021-08-19 DIAGNOSIS — Z79891 Long term (current) use of opiate analgesic: Secondary | ICD-10-CM | POA: Diagnosis not present

## 2021-08-19 DIAGNOSIS — E872 Acidosis, unspecified: Secondary | ICD-10-CM | POA: Diagnosis not present

## 2021-08-19 DIAGNOSIS — I4510 Unspecified right bundle-branch block: Secondary | ICD-10-CM | POA: Diagnosis not present

## 2021-08-19 DIAGNOSIS — E871 Hypo-osmolality and hyponatremia: Secondary | ICD-10-CM | POA: Diagnosis not present

## 2021-08-19 DIAGNOSIS — I495 Sick sinus syndrome: Secondary | ICD-10-CM | POA: Diagnosis not present

## 2021-08-19 DIAGNOSIS — Z20822 Contact with and (suspected) exposure to covid-19: Secondary | ICD-10-CM | POA: Diagnosis not present

## 2021-08-19 DIAGNOSIS — I776 Arteritis, unspecified: Secondary | ICD-10-CM | POA: Diagnosis not present

## 2021-08-19 DIAGNOSIS — Z79899 Other long term (current) drug therapy: Secondary | ICD-10-CM | POA: Diagnosis not present

## 2021-08-19 DIAGNOSIS — Z91012 Allergy to eggs: Secondary | ICD-10-CM | POA: Diagnosis not present

## 2021-08-19 DIAGNOSIS — I088 Other rheumatic multiple valve diseases: Secondary | ICD-10-CM | POA: Diagnosis not present

## 2021-08-19 DIAGNOSIS — I3139 Other pericardial effusion (noninflammatory): Secondary | ICD-10-CM | POA: Diagnosis not present

## 2021-08-19 DIAGNOSIS — D638 Anemia in other chronic diseases classified elsewhere: Secondary | ICD-10-CM | POA: Diagnosis not present

## 2021-08-19 DIAGNOSIS — R6510 Systemic inflammatory response syndrome (SIRS) of non-infectious origin without acute organ dysfunction: Secondary | ICD-10-CM | POA: Diagnosis not present

## 2021-08-19 DIAGNOSIS — D649 Anemia, unspecified: Secondary | ICD-10-CM | POA: Diagnosis not present

## 2021-08-19 DIAGNOSIS — K559 Vascular disorder of intestine, unspecified: Secondary | ICD-10-CM | POA: Diagnosis not present

## 2021-08-19 DIAGNOSIS — R079 Chest pain, unspecified: Secondary | ICD-10-CM | POA: Diagnosis not present

## 2021-08-19 DIAGNOSIS — R112 Nausea with vomiting, unspecified: Secondary | ICD-10-CM | POA: Diagnosis not present

## 2021-08-19 DIAGNOSIS — R9431 Abnormal electrocardiogram [ECG] [EKG]: Secondary | ICD-10-CM | POA: Diagnosis not present

## 2021-08-19 DIAGNOSIS — R5081 Fever presenting with conditions classified elsewhere: Secondary | ICD-10-CM | POA: Diagnosis not present

## 2021-08-19 DIAGNOSIS — R509 Fever, unspecified: Secondary | ICD-10-CM | POA: Diagnosis not present

## 2021-08-19 DIAGNOSIS — Z6829 Body mass index (BMI) 29.0-29.9, adult: Secondary | ICD-10-CM | POA: Diagnosis not present

## 2021-08-19 DIAGNOSIS — I083 Combined rheumatic disorders of mitral, aortic and tricuspid valves: Secondary | ICD-10-CM | POA: Diagnosis not present

## 2021-08-19 LAB — ECHOCARDIOGRAM COMPLETE
AV Mean grad: 11 mmHg
AV Peak grad: 19.2 mmHg
Ao pk vel: 2.19 m/s
Area-P 1/2: 4.89 cm2
P 1/2 time: 297 msec
S' Lateral: 5 cm

## 2021-08-21 ENCOUNTER — Ambulatory Visit
Admission: RE | Admit: 2021-08-21 | Discharge: 2021-08-21 | Disposition: A | Discharge status: Home / Self Care | Payer: BC Managed Care – PPO | Source: Ambulatory Visit

## 2021-08-21 ENCOUNTER — Telehealth: Payer: Self-pay | Admitting: Cardiovascular Disease

## 2021-08-21 NOTE — Telephone Encounter (Signed)
Pt c/o of Chest Pain: STAT if CP now or developed within 24 hours ? ?1. Are you having CP right now? no ? ?2. Are you experiencing any other symptoms (ex. SOB, nausea, vomiting, sweating)? Vomiting, migraines, shakes, slight fever ? ?3. How long have you been experiencing CP? Sunday  ? ?4. Is your CP continuous or coming and going? Comes and goes ? ?5. Have you taken Nitroglycerin? no ? ? ?Patient states since Sunday he has had mild chest pain, vomiting, migraines, shakes, and a slight fever. He says he recently had a procedure and his antibiotics were done Thursday.  ?

## 2021-08-21 NOTE — ED Provider Notes (Signed)
Patient's wife decided to leave without patient being seen.  Unable to obtain full history due to wife's explosive behavior and stomping out of the room, taking her husband with her. ?  ?Theadora Rama Scales, PA-C ?08/21/21 1458 ? ?

## 2021-08-21 NOTE — Telephone Encounter (Signed)
Attempted to reach the patient. Unable to leave a message due to full voicemail.  ?

## 2021-08-21 NOTE — ED Triage Notes (Signed)
Pt states he recently had open heart surgery, PICC line was recently removed last Thursday. He c/o chills, n/v, and states he finished his abx last Thursday.  ?

## 2021-08-22 ENCOUNTER — Encounter: Payer: Self-pay | Admitting: Internal Medicine

## 2021-08-22 ENCOUNTER — Ambulatory Visit (INDEPENDENT_AMBULATORY_CARE_PROVIDER_SITE_OTHER): Payer: BC Managed Care – PPO | Admitting: Internal Medicine

## 2021-08-22 ENCOUNTER — Emergency Department (HOSPITAL_COMMUNITY): Payer: BC Managed Care – PPO

## 2021-08-22 ENCOUNTER — Encounter (HOSPITAL_COMMUNITY): Payer: Self-pay

## 2021-08-22 ENCOUNTER — Inpatient Hospital Stay (HOSPITAL_COMMUNITY)
Admission: EM | Admit: 2021-08-22 | Discharge: 2021-08-27 | DRG: 546 | Disposition: A | Discharge status: Other Acute Inpatient Hospital | Payer: BC Managed Care – PPO | Attending: Family Medicine | Admitting: Family Medicine

## 2021-08-22 ENCOUNTER — Other Ambulatory Visit: Payer: Self-pay

## 2021-08-22 VITALS — BP 140/70 | HR 121

## 2021-08-22 DIAGNOSIS — I351 Nonrheumatic aortic (valve) insufficiency: Secondary | ICD-10-CM | POA: Diagnosis present

## 2021-08-22 DIAGNOSIS — T826XXA Infection and inflammatory reaction due to cardiac valve prosthesis, initial encounter: Secondary | ICD-10-CM | POA: Diagnosis not present

## 2021-08-22 DIAGNOSIS — I330 Acute and subacute infective endocarditis: Secondary | ICD-10-CM | POA: Diagnosis not present

## 2021-08-22 DIAGNOSIS — I358 Other nonrheumatic aortic valve disorders: Secondary | ICD-10-CM | POA: Diagnosis not present

## 2021-08-22 DIAGNOSIS — Z91010 Allergy to peanuts: Secondary | ICD-10-CM | POA: Diagnosis not present

## 2021-08-22 DIAGNOSIS — D509 Iron deficiency anemia, unspecified: Secondary | ICD-10-CM | POA: Diagnosis present

## 2021-08-22 DIAGNOSIS — R079 Chest pain, unspecified: Secondary | ICD-10-CM

## 2021-08-22 DIAGNOSIS — D638 Anemia in other chronic diseases classified elsewhere: Secondary | ICD-10-CM | POA: Diagnosis present

## 2021-08-22 DIAGNOSIS — Z79899 Other long term (current) drug therapy: Secondary | ICD-10-CM | POA: Diagnosis not present

## 2021-08-22 DIAGNOSIS — A419 Sepsis, unspecified organism: Secondary | ICD-10-CM | POA: Diagnosis not present

## 2021-08-22 DIAGNOSIS — E876 Hypokalemia: Secondary | ICD-10-CM | POA: Diagnosis present

## 2021-08-22 DIAGNOSIS — Z20822 Contact with and (suspected) exposure to covid-19: Secondary | ICD-10-CM | POA: Diagnosis present

## 2021-08-22 DIAGNOSIS — Z91012 Allergy to eggs: Secondary | ICD-10-CM | POA: Diagnosis not present

## 2021-08-22 DIAGNOSIS — L0591 Pilonidal cyst without abscess: Secondary | ICD-10-CM | POA: Diagnosis not present

## 2021-08-22 DIAGNOSIS — Z8249 Family history of ischemic heart disease and other diseases of the circulatory system: Secondary | ICD-10-CM

## 2021-08-22 DIAGNOSIS — R630 Anorexia: Secondary | ICD-10-CM | POA: Diagnosis present

## 2021-08-22 DIAGNOSIS — I495 Sick sinus syndrome: Secondary | ICD-10-CM | POA: Diagnosis not present

## 2021-08-22 DIAGNOSIS — R000 Tachycardia, unspecified: Secondary | ICD-10-CM | POA: Diagnosis present

## 2021-08-22 DIAGNOSIS — I776 Arteritis, unspecified: Principal | ICD-10-CM | POA: Diagnosis present

## 2021-08-22 DIAGNOSIS — R6510 Systemic inflammatory response syndrome (SIRS) of non-infectious origin without acute organ dysfunction: Secondary | ICD-10-CM | POA: Diagnosis present

## 2021-08-22 DIAGNOSIS — R112 Nausea with vomiting, unspecified: Principal | ICD-10-CM

## 2021-08-22 DIAGNOSIS — Z7982 Long term (current) use of aspirin: Secondary | ICD-10-CM

## 2021-08-22 DIAGNOSIS — Z6829 Body mass index (BMI) 29.0-29.9, adult: Secondary | ICD-10-CM | POA: Diagnosis not present

## 2021-08-22 DIAGNOSIS — T827XXA Infection and inflammatory reaction due to other cardiac and vascular devices, implants and grafts, initial encounter: Secondary | ICD-10-CM | POA: Diagnosis not present

## 2021-08-22 DIAGNOSIS — Z9889 Other specified postprocedural states: Secondary | ICD-10-CM

## 2021-08-22 LAB — COMPREHENSIVE METABOLIC PANEL
ALT: 16 U/L (ref 0–44)
AST: 11 U/L — ABNORMAL LOW (ref 15–41)
Albumin: 3.2 g/dL — ABNORMAL LOW (ref 3.5–5.0)
Alkaline Phosphatase: 64 U/L (ref 38–126)
Anion gap: 15 (ref 5–15)
BUN: 8 mg/dL (ref 6–20)
CO2: 20 mmol/L — ABNORMAL LOW (ref 22–32)
Calcium: 9.1 mg/dL (ref 8.9–10.3)
Chloride: 98 mmol/L (ref 98–111)
Creatinine, Ser: 0.91 mg/dL (ref 0.61–1.24)
GFR, Estimated: >60 mL/min (ref >60)
Glucose, Bld: 102 mg/dL — ABNORMAL HIGH (ref 70–99)
Potassium: 3.1 mmol/L — ABNORMAL LOW (ref 3.5–5.1)
Sodium: 133 mmol/L — ABNORMAL LOW (ref 135–145)
Total Bilirubin: 1.2 mg/dL (ref 0.3–1.2)
Total Protein: 8.3 g/dL — ABNORMAL HIGH (ref 6.5–8.1)

## 2021-08-22 LAB — CBC WITH DIFFERENTIAL/PLATELET
Abs Immature Granulocytes: 0.18 10*3/uL — ABNORMAL HIGH (ref 0.00–0.07)
Basophils Absolute: 0 10*3/uL (ref 0.0–0.1)
Basophils Relative: 0 %
Eosinophils Absolute: 0 10*3/uL (ref 0.0–0.5)
Eosinophils Relative: 0 %
HCT: 32.4 % — ABNORMAL LOW (ref 39.0–52.0)
Hemoglobin: 10.1 g/dL — ABNORMAL LOW (ref 13.0–17.0)
Immature Granulocytes: 1 %
Lymphocytes Relative: 5 %
Lymphs Abs: 0.7 10*3/uL (ref 0.7–4.0)
MCH: 23.5 pg — ABNORMAL LOW (ref 26.0–34.0)
MCHC: 31.2 g/dL (ref 30.0–36.0)
MCV: 75.5 fL — ABNORMAL LOW (ref 80.0–100.0)
Monocytes Absolute: 1.3 10*3/uL — ABNORMAL HIGH (ref 0.1–1.0)
Monocytes Relative: 10 %
Neutro Abs: 11.7 10*3/uL — ABNORMAL HIGH (ref 1.7–7.7)
Neutrophils Relative %: 84 %
Platelets: 302 10*3/uL (ref 150–400)
RBC: 4.29 MIL/uL (ref 4.22–5.81)
RDW: 15 % (ref 11.5–15.5)
WBC: 14 10*3/uL — ABNORMAL HIGH (ref 4.0–10.5)
nRBC: 0 % (ref 0.0–0.2)

## 2021-08-22 LAB — PROTIME-INR
INR: 1.4 — ABNORMAL HIGH (ref 0.8–1.2)
Prothrombin Time: 17.1 seconds — ABNORMAL HIGH (ref 11.4–15.2)

## 2021-08-22 LAB — LACTIC ACID, PLASMA
Lactic Acid, Venous: 0.9 mmol/L (ref 0.5–1.9)
Lactic Acid, Venous: 0.9 mmol/L (ref 0.5–1.9)

## 2021-08-22 LAB — TROPONIN I (HIGH SENSITIVITY)
Troponin I (High Sensitivity): 45 ng/L — ABNORMAL HIGH (ref <18)
Troponin I (High Sensitivity): 57 ng/L — ABNORMAL HIGH (ref <18)

## 2021-08-22 LAB — MAGNESIUM: Magnesium: 1.8 mg/dL (ref 1.7–2.4)

## 2021-08-22 MED ORDER — ONDANSETRON HCL 4 MG/2ML IJ SOLN
4.0000 mg | Freq: Four times a day (QID) | INTRAMUSCULAR | Status: DC | PRN
  Administered 2021-08-23 – 2021-08-26 (×6): 4 mg via INTRAVENOUS
  Filled 2021-08-22 (×6): qty 2

## 2021-08-22 MED ORDER — VANCOMYCIN HCL IN DEXTROSE 1-5 GM/200ML-% IV SOLN
1000.0000 mg | Freq: Once | INTRAVENOUS | Status: AC
  Administered 2021-08-22: 1000 mg via INTRAVENOUS
  Filled 2021-08-22: qty 200

## 2021-08-22 MED ORDER — FENTANYL CITRATE PF 50 MCG/ML IJ SOSY
50.0000 ug | PREFILLED_SYRINGE | Freq: Once | INTRAMUSCULAR | Status: AC
  Administered 2021-08-22: 50 ug via INTRAVENOUS
  Filled 2021-08-22: qty 1

## 2021-08-22 MED ORDER — SODIUM CHLORIDE 0.9 % IV SOLN
2.0000 g | Freq: Three times a day (TID) | INTRAVENOUS | Status: DC
  Administered 2021-08-23 – 2021-08-24 (×5): 2 g via INTRAVENOUS
  Filled 2021-08-22 (×5): qty 12.5

## 2021-08-22 MED ORDER — VANCOMYCIN HCL 1500 MG/300ML IV SOLN
1500.0000 mg | Freq: Two times a day (BID) | INTRAVENOUS | Status: DC
  Administered 2021-08-23 – 2021-08-25 (×4): 1500 mg via INTRAVENOUS
  Filled 2021-08-22 (×7): qty 300

## 2021-08-22 MED ORDER — HEPARIN SODIUM (PORCINE) 5000 UNIT/ML IJ SOLN
5000.0000 [IU] | Freq: Three times a day (TID) | INTRAMUSCULAR | Status: DC
  Administered 2021-08-22 – 2021-08-27 (×14): 5000 [IU] via SUBCUTANEOUS
  Filled 2021-08-22 (×14): qty 1

## 2021-08-22 MED ORDER — LACTATED RINGERS IV SOLN: INTRAVENOUS | Status: AC

## 2021-08-22 MED ORDER — LACTATED RINGERS IV BOLUS (SEPSIS)
1000.0000 mL | Freq: Once | INTRAVENOUS | Status: AC
  Administered 2021-08-22: 1000 mL via INTRAVENOUS

## 2021-08-22 MED ORDER — ACETAMINOPHEN 650 MG RE SUPP: 650.0000 mg | Freq: Four times a day (QID) | RECTAL | Status: DC | PRN

## 2021-08-22 MED ORDER — ACETAMINOPHEN 500 MG PO TABS
1000.0000 mg | ORAL_TABLET | Freq: Once | ORAL | Status: AC
  Administered 2021-08-22: 1000 mg via ORAL
  Filled 2021-08-22: qty 2

## 2021-08-22 MED ORDER — ASPIRIN EC 81 MG PO TBEC
81.0000 mg | DELAYED_RELEASE_TABLET | Freq: Every day | ORAL | Status: DC
  Administered 2021-08-23 – 2021-08-26 (×4): 81 mg via ORAL
  Filled 2021-08-22 (×4): qty 1

## 2021-08-22 MED ORDER — SODIUM CHLORIDE 0.9 % IV SOLN
2.0000 g | Freq: Once | INTRAVENOUS | Status: AC
  Administered 2021-08-22: 2 g via INTRAVENOUS
  Filled 2021-08-22: qty 12.5

## 2021-08-22 MED ORDER — POTASSIUM CHLORIDE CRYS ER 20 MEQ PO TBCR
40.0000 meq | EXTENDED_RELEASE_TABLET | Freq: Once | ORAL | Status: AC
  Administered 2021-08-22: 40 meq via ORAL
  Filled 2021-08-22: qty 2

## 2021-08-22 MED ORDER — ONDANSETRON HCL 4 MG PO TABS: 4.0000 mg | ORAL_TABLET | Freq: Four times a day (QID) | ORAL | Status: DC | PRN

## 2021-08-22 MED ORDER — ACETAMINOPHEN 325 MG PO TABS
650.0000 mg | ORAL_TABLET | Freq: Four times a day (QID) | ORAL | Status: DC | PRN
  Administered 2021-08-23 – 2021-08-26 (×8): 650 mg via ORAL
  Filled 2021-08-22 (×8): qty 2

## 2021-08-22 MED ORDER — SODIUM CHLORIDE 0.9% FLUSH
3.0000 mL | Freq: Two times a day (BID) | INTRAVENOUS | Status: DC
  Administered 2021-08-22 – 2021-08-26 (×8): 3 mL via INTRAVENOUS

## 2021-08-22 NOTE — Assessment & Plan Note (Addendum)
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  Completed 6 weeks empiric IV antibiotic therapy with daptomycin and ceftriaxone on 4/4.  Now returning with fever, leukocytosis, tachycardia, tachypnea.   ?-Follow blood cultures (1 collected so far, second pending collection after antibiotics initiated) ?-Started on empiric IV vancomycin and cefepime ?-Cardiology consulted ?-Stat limited TTE obtained, results pending ?-Anticipate TEE tomorrow per cardiology ?-Continue IV fluid hydration overnight ?

## 2021-08-22 NOTE — Progress Notes (Signed)
?Cardiology Office Note:   ? ?Date:  08/22/2021  ? ?ID:  Martin Stafford, DOB [birth year], MRN [id number] ? ?PCP:  Patient, No Pcp Per (Inactive) ?  ?[facility] Providers ?Cardiologist:  Sanda Klein, MD    ? ?Referring MD: No ref. provider found  ? ?No chief complaint on file. ?Nausea/Chest pain ? ?History of Present Illness:   ? ?Martin Stafford is a 31 y.o. male with a hx of  ? ?Patient was recently discharged s/p aortic homograft. Per DC summary: "Patient had approximately week long history of chest pain, sore throat which was then accompanied by chills and likely fever.  Was seen in the ED 4 times before being admitted  due to symptoms with slightly elevated troponin which began to trend downward (122> 140> 114> 109).  Echocardiogram showed LVEF of 123456, grade 1 diastolic dysfunction, small circumferential pericardial effusion, trivial mitral, pulmonic and tricuspid valve regurg, severe aortic regurgitation with diffuse color-flow in LVOT, with diastolic flow reversal in abdominal aorta.  CT angio was ordered which ruled out aortic dissection/aneurysm. Blood cx obtained on 2/16 and 2/17 showed no growth.  With a diagnosis of endocarditis  patient was empirically started on cefepime 2 g TID and vancomycin 2 g once followed by 1.5 g BID. ID consulted and cefepime switched to ceftriaxone 2g daily. TEE on 07/01/21 confirmed severe aortic insufficiency with significant flow reversal in the thoracic aorta.  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 ? ?Source of infection was a pilonidal cyst. ? ?He is s/p homograft of his aortic root 25 mm Lifenet, He is s/p debridement and closure of subannular abscess. ? ?He's completed his IV antibiotic course and noted nausea, vomiting and chest pain. Called CT surgeries office who recommended he see cardiology. He had a follow up echo that showed mild to moderate AI of his native valve. He presents  to DOD with tachycardia, fever and nausea. He was recommended to go to the ED for further evaluaton ? ?Past Medical History:  ?Diagnosis Date  ? Environmental allergies   ? Generalized skin cysts   ? ? ?Past Surgical History:  ?Procedure Laterality Date  ? ASCENDING AORTIC ROOT REPLACEMENT N/A 07/05/2021  ? Procedure: Homograft aortic root replacement using 68mm Cryo Aortic Valve.;  Surgeon: Dahlia Byes, MD;  Location: [place];  Service: Open Heart Surgery;  Laterality: N/A;  ? COLON SURGERY    ? PILONIDAL CYST DRAINAGE N/A 07/03/2021  ? Procedure: EXCISIONAL DEBRIDEMENT CHRONIC PILONIDAL TRACT;  Surgeon: Coralie Keens, MD;  Location: [place];  Service: General;  Laterality: N/A;  ? TEE WITHOUT CARDIOVERSION N/A 07/01/2021  ? Procedure: TRANSESOPHAGEAL ECHOCARDIOGRAM (TEE);  Surgeon: Fay Records, MD;  Location: [place];  Service: Cardiovascular;  Laterality: N/A;  ? TEE WITHOUT CARDIOVERSION N/A 07/05/2021  ? Procedure: TRANSESOPHAGEAL ECHOCARDIOGRAM (TEE);  Surgeon: Dahlia Byes, MD;  Location: [place];  Service: Open Heart Surgery;  Laterality: N/A;  ? ? ?Current Medications: ?No outpatient medications have been marked as taking for the 08/22/21 encounter (Office Visit) with Janina Mayo, MD.  ?  ? ?Allergies:   Eggs or egg-derived products and Peanut-containing drug products  ? ?Social History  ? ?Socioeconomic History  ? Marital status: Single  ?  Spouse name: Not on file  ? Number of children: Not on file  ? Years of education: Not on file  ? Highest education level: Not on file  ?Occupational History  ?  Not on file  ?Tobacco Use  ? Smoking status: Never  ? Smokeless tobacco: Never  ?Vaping Use  ? Vaping Use: Never used  ?Substance and Sexual Activity  ? Alcohol use: No  ? Drug use: No  ? Sexual activity: Not on file  ?Other Topics Concern  ? Not on file  ?Social History Narrative  ? Not on file  ? ?Social Determinants of Health  ? ?Financial Resource Strain: Not on file  ?Food Insecurity: Not on file   ?Transportation Needs: Not on file  ?Physical Activity: Not on file  ?Stress: Not on file  ?Social Connections: Not on file  ?  ? ?Family History: ?The patient's family history includes Asthma in his mother; Diabetes in an other family member; Hypertension in his father and another family member. ? ?ROS:   ?Please see the history of present illness.    ? All other systems reviewed and are negative. ? ?EKGs/Labs/Other Studies Reviewed:   ? ?The following studies were reviewed today: ? ? ?EKG:  EKG is  ordered today.  The ekg ordered today demonstrates  ? ?Sinus tachycardia  ? ?Recent Labs: ?07/02/2021: TSH 1.260 ?07/06/2021: Magnesium 2.3 ?07/10/2021: ALT 55 ?07/12/2021: BUN 16; Creatinine, Ser 0.73; Hemoglobin 10.5; Platelets 513; Potassium 3.8; Sodium 129  ?Recent Lipid Panel ?No results found for: CHOL, TRIG, HDL, CHOLHDL, VLDL, LDLCALC, LDLDIRECT ? ? ?Risk Assessment/Calculations:   ?  ? ?    ? ?Physical Exam:   ? ?VS:   ? ?Temp 101F ?Vitals:  ? 08/22/21 1634  ?BP: 140/70  ?Pulse: (!) 121  ?SpO2: 97%  ? ? ? ?Wt Readings from Last 3 Encounters:  ?08/01/21 223 lb (101.2 kg)  ?07/30/21 224 lb (101.6 kg)  ?07/24/21 224 lb (101.6 kg)  ?  ? ?GEN: Ill appearing, lying on the table ?HEENT: Normal ?NECK: No JVD; No carotid bruits ?LYMPHATICS: No lymphadenopathy ?CARDIAC: tachycardic, +diastolic murmur,  rubs, gallops ?RESPIRATORY:  Clear to auscultation without rales, wheezing or rhonchi  ?ABDOMEN: Soft, non-tender, non-distended ?MUSCULOSKELETAL:  No edema; No deformity  ?SKIN: Warm and dry ?NEUROLOGIC:  Alert and oriented x 3 ?PSYCHIATRIC:  Normal affect  ? ?ASSESSMENT:   ? ?# S/p aortic homograft and subannular abscess debridement: Native aortic valve with residual mil-moderate AI. LV is more dilated from prior echo. AI may be more significant. He has signs of SIRS. Otherwise, not in respiratory distress and stable.I recommended he go to the emergency room for further evaluation. If cultures are still positive, he may need  a repeat TEE to determine if he has recurrent valve infection.  ? ? ?PLAN:   ? ?In order of problems listed above: ? ?Sent to the ED ?Blood cultures x2 ?Chest xray ?IV antibiotics ?CT surgery consult ? ? ? ?Cardiac Rehabilitation Eligibility Assessment  ?The patient is ready to start cardiac rehabilitation pending clearance from the cardiac surgeon. ?   ? ?   ? ? ?Medication Adjustments/Labs and Tests Ordered: ?Current medicines are reviewed at length with the patient today.  Concerns regarding medicines are outlined above.  ?No orders of the defined types were placed in this encounter. ? ?No orders of the defined types were placed in this encounter. ? ? ?Patient Instructions  ?Medication Instructions:  ?No Changes In Medications at this time.  ?*If you need a refill on your cardiac medications before your next appointment, please call your pharmacy* ? ?Follow-Up: ?At [facility], you and your health needs are our priority.  As part of our continuing  mission to provide you with exceptional heart care, we have created designated Provider Care Teams.  These Care Teams include your primary Cardiologist (physician) and Advanced Practice Providers (APPs -  Physician Assistants and Nurse Practitioners) who all work together to provide you with the care you need, when you need it. ? ?Your next appointment:   ?TBD ? ?The format for your next appointment:   ?In Person ? ?Provider:   ?Sanda Klein, MD   ? ?Other Instructions ?PLEASE REPORT DIRECTLY TO THE EMERGENCY DEPARTMENT  ? ? ? ? ?   ? ?Signed, ?Janina Mayo, MD  ?08/22/2021 5:09 PM    ?[place] ?

## 2021-08-22 NOTE — H&P (Signed)
?History and Physical  ? ? ?Martin Stafford [id number] DOB: 1990/11/11 DOA: 08/22/2021 ? ?PCP: Patient, No Pcp Per (Inactive)  ?Patient coming from: Cardiology office ? ?I have personally briefly reviewed patient's old medical records in [place] ? ?Chief Complaint: Fever, chest pain ? ?HPI: ?Martin Stafford is a 31 y.o. 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 ? ?Patient recently admitted 06/27/2021-07/12/2021 for aortic valve infective endocarditis subsequently found to have aortic root/LVOT abscess.  He underwent aortic root replacement utilizing homograft by cardiothoracic surgery, Dr. Prescott Gum, on 07/05/2021.  Blood and tissue cultures during admission were negative.  ID were consulted and he was placed on empiric IV daptomycin and ceftriaxone for total 6 weeks therapy via PICC line which he has since completed on 4/4. ? ?Follow-up TTE 08/19/2021 showed EF 50-55% (previously >60%), LV global hypokinesis, mild-moderate AV regurgitation, s/p aortic root homograft changes. ? ?Patient states he has been feeling well until 4/9 when he began to experience chills, central chest discomfort, nausea, and vomiting.  Over the last 2 days he has been having fevers.  He reports diarrhea in the last 24 hours.  He has had some intermittent abdominal discomfort.  He states that his sternotomy site is healing well and has not seen any drainage or erythema at the area.  He also feels that his pilonidal cyst surgical incision site is also healing well, without active drainage or further pain. ? ?He was seen in cardiology clinic today (4/13).  He was noted to have signs of SIRS and concern for worsening aortic insufficiency.   He was sent to the ED with recommendations to obtain blood cultures, chest x-ray, start on IV antibiotics, and obtain CT surgery consult. ? ?ED Course  Labs/Imaging on admission: I have personally reviewed following labs and imaging studies. ? ?Initial vitals showed BP 125/74, pulse 122, RR 22, temp 1 1.2 ?F, SPO2 97% on room air. ? ?Labs show WBC 14.0, hemoglobin 10.1, platelets 302,000, sodium 133, potassium 3.1, bicarb 20, BUN 8, creatinine 0.91, serum glucose 102, lactic acid 0.9, troponin 45. ? ?Blood cultures ordered, only 1 collected so far.  Urinalysis pending collection. ? ?2 view chest x-ray shows improving left lower lobe infiltrate without new findings. ? ?Patient was given 1 L LR.  Cardiology were consulted and have performed stat limited TTE, results pending.  They have recommended medical admission.  Patient was started on IV vancomycin and cefepime.  40 mEq oral potassium ordered.  The hospitalist service was consulted to admit for further evaluation and management. ? ?Review of Systems: All systems reviewed and are negative except as documented in history of present illness above. ? ? ?Past Medical History:  ?Diagnosis Date  ? Environmental allergies   ? Generalized skin cysts   ? ? ?Past Surgical History:  ?Procedure Laterality Date  ? ASCENDING AORTIC ROOT REPLACEMENT N/A 07/05/2021  ? Procedure: Homograft aortic root replacement using 2mm Cryo Aortic Valve.;  Surgeon: Dahlia Byes, MD;  Location: [place];  Service: Open Heart Surgery;  Laterality: N/A;  ? COLON SURGERY    ? PILONIDAL CYST DRAINAGE N/A 07/03/2021  ? Procedure: EXCISIONAL DEBRIDEMENT CHRONIC PILONIDAL TRACT;  Surgeon: Coralie Keens, MD;  Location: [place];  Service: General;  Laterality: N/A;  ? TEE  WITHOUT CARDIOVERSION N/A 07/01/2021  ? Procedure: TRANSESOPHAGEAL ECHOCARDIOGRAM (TEE);  Surgeon: Fay Records, MD;  Location: [place];  Service: Cardiovascular;  Laterality: N/A;  ? TEE WITHOUT CARDIOVERSION N/A 07/05/2021  ?  Procedure: TRANSESOPHAGEAL ECHOCARDIOGRAM (TEE);  Surgeon: Dahlia Byes, MD;  Location: [place];  Service: Open Heart Surgery;  Laterality: N/A;  ? ? ?Social History: ? reports that he has never smoked. He has never used smokeless tobacco. He reports that he does not drink alcohol and does not use drugs. ? ?Allergies  ?Allergen Reactions  ? Eggs Or Egg-Derived Products   ?  "eggs"  ? Peanut-Containing Drug Products   ?  "peanuts"  ? ? ?Family History  ?Problem Relation Age of Onset  ? Hypertension Father   ? Hypertension Other   ? Diabetes Other   ? Asthma Mother   ? ? ? ?Prior to Admission medications   ?Medication Sig Start Date End Date Taking? Authorizing Provider  ?acetaminophen (TYLENOL) 325 MG tablet Take 650 mg by mouth every 6 (six) hours as needed for mild pain or headache.   Yes [provider]  ?amoxicillin (AMOXIL) 500 MG tablet Take 4 tablets (2 grams) 30-60 minutes prior to dental procedure. 08/01/21  Yes Lenna Sciara, NP  ?aspirin EC 81 MG EC tablet Take 1 tablet (81 mg total) by mouth daily. Swallow whole. 07/12/21  Yes Roddenberry, Arlis Porta, PA-C  ?losartan (COZAAR) 25 MG tablet Take 1 tablet (25 mg total) by mouth daily. 07/12/21  Yes Antony Odea, PA-C  ?metoprolol succinate (TOPROL-XL) 100 MG 24 hr tablet Take 1 tablet (100 mg total) by mouth daily. Take with or immediately following a meal. 07/12/21  Yes Roddenberry, Myron G, PA-C  ?pantoprazole (PROTONIX) 20 MG tablet Take 1 tablet (20 mg total) by mouth daily. ?Patient not taking: Reported on 08/22/2021 06/23/21   Petrucelli, Glynda Jaeger, PA-C  ? ? ?Physical Exam: ?Vitals:  ? 08/22/21 1845 08/22/21 1848 08/22/21 1930 08/22/21 2015  ?BP: (!) 147/71  127/65 124/66  ?Pulse: (!) 107 (!) 101 (!) 102 98  ?Resp: (!) 22 19 (!) 25 17  ?Temp:      ?TempSrc:      ?SpO2: 96% 97% 97% 98%  ?Weight:      ?Height:      ? ?Constitutional: Resting in bed, NAD, calm, comfortable ?Eyes: PERRL, lids and conjunctivae normal ?ENMT: Mucous membranes are  moist. Posterior pharynx clear of any exudate or lesions.Normal dentition.  ?Neck: normal, supple, no masses. ?Respiratory: clear to auscultation bilaterally, no wheezing, no crackles. Normal respiratory effort. No accessory muscle use.  ?Cardiovascular: Tachycardic with soft systolic murmur.  . No extremity edema. 2+ pedal pulses.  Well-healed sternotomy scar without open wound or drainage. ?Abdomen: no tenderness, no masses palpated. Bowel sounds positive.  ?Musculoskeletal: no clubbing / cyanosis. No joint deformity upper and lower extremities. Good ROM, no contractures. Normal muscle tone.  ?Skin: Gluteal cleft surgical incision appears to be healing well with gauze placed in wound, no active drainage or surrounding erythema.  Sternotomy scar healing well without open wound or drainage. ?Neurologic: Sensation intact. Strength 5/5 in all 4.  ?Psychiatric: Normal judgment and insight. Alert and oriented x 3. Normal mood.  ? ?EKG: Personally reviewed. Sinus tachycardia, rate 122, ST depression leads I and aVL.  Rate is faster and ST changes new when compared to prior. ? ?Assessment/Plan ?Principal Problem: ?  SIRS (systemic inflammatory response syndrome) (HCC) ?Active Problems: ?  Hx of repair of aortic root ?  History of excision of pilonidal cyst ?  Hypokalemia ?  ?Martin Stafford is a 31 y.o. 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  Cardiology consulted and obtaining stat TTE with likely plan for TEE.  Started on broad-spectrum antibiotics with IV vancomycin and cefepime. ? ?Assessment and Plan: ?* SIRS (systemic inflammatory response syndrome) (HCC) ?Recent culture negative infective endocarditis of  native aortic valve with aortic root/LVOT abscess ?S/p homograft aortic root replacement 07/05/2021 ?Recent cardiac surgery for culture-negative endocarditis/aortic root abscess as listed above.  Completed 6 wee

## 2021-08-22 NOTE — Progress Notes (Signed)
Pharmacy Antibiotic Note ? ?Martin Stafford is a 31 y.o. male admitted on 08/22/2021 with sepsis.  Pharmacy has been consulted for vancomycin and cefepime dosing. ? ?Patient was given vancomycin 1000 mg x1 per MD; will follow it with another vancomycin 1000 mg x1 for a total loading dose of vancomycin 2000 mg IV x1 (~20mg /kg). Vancomycin maintenance dose to follow:  ?Vancomycin 1500 mg IV every 12 hours ?Scr used: 0.91 mg/dL ?Weight: 101.2 kg ?Vd coeff: 0.5 L/kg ?Est AUC: 537.6 ? ? ?Plan: ?Cefepime 2 g IV q8h  ?Vancomycin 2000 mg IV bolus x1 followed by vancomycin 1500 mg IV q12h  ?F/u clinical course, fever curve, cultures ?Monitor renal function  ?Vancomycin levels at steady state as needed ? ?Height: 5\' 11"  (180.3 cm) ?Weight: 101.2 kg (223 lb 1.7 oz) ?IBW/kg (Calculated) : 75.3 ? ?Temp (24hrs), Avg:101.2 ?F (38.4 ?C), Min:101.2 ?F (38.4 ?C), Max:101.2 ?F (38.4 ?C) ? ?Recent Labs  ?Lab 08/22/21 ?1740  ?WBC 14.0*  ?  ?CrCl cannot be calculated (Patient's most recent lab result is older than the maximum 21 days allowed.).   ? ?Allergies  ?Allergen Reactions  ? Eggs Or Egg-Derived Products   ?  "eggs"  ? Peanut-Containing Drug Products   ?  "peanuts"  ? ? ?Antimicrobials this admission: ?Cefepime 4/13 >>  ?Vancomycin 4/13 >>  ? ?Dose adjustments this admission: ?N/A ? ?Microbiology results: ?4/13 BCx: in process ? ? ?Thank you for allowing pharmacy to be a part of this patient?s care. ? ?Zenaida Deed, PharmD ?PGY1 Acute Care Pharmacy Resident  ?Phone: [phone] ?08/22/2021  9:39 PM ? ?Please check [email or web address] for unit-specific pharmacy phone numbers. ? ? ?

## 2021-08-22 NOTE — ED Provider Notes (Addendum)
[facility] EMERGENCY DEPARTMENT Provider Note   CSN: [id number] Arrival date & time: 08/22/21  1659     History  Chief Complaint  Patient presents with   Emesis    Martin Stafford is a 31 y.o. male.  31 year old male with history recent diagnosis of endocarditis, status post aortic root repair presents with 4 days of nausea vomiting and fever and chills.  Patient just finished 6 weeks of IV antibiotics has PICC line pulled.  States has been more short of breath and has had mild cough without severe chest pain.  He denies any diarrhea.  Went to cardiology walk-in clinic and sent here for further evaluation      Home Medications Prior to Admission medications   Medication Sig Start Date End Date Taking? Authorizing Provider  acetaminophen (TYLENOL) 325 MG tablet Take 650 mg by mouth every 6 (six) hours as needed for mild pain or headache.    [provider]  amoxicillin (AMOXIL) 500 MG tablet Take 4 tablets (2 grams) 30-60 minutes prior to dental procedure. 08/01/21   Joylene Grapes, NP  Ascorbic Acid (VITAMIN C PO) Take 1 tablet by mouth daily. Patient not taking: Reported on 08/01/2021    [provider]  aspirin EC 81 MG EC tablet Take 1 tablet (81 mg total) by mouth daily. Swallow whole. 07/12/21   Leary Roca, PA-C  losartan (COZAAR) 25 MG tablet Take 1 tablet (25 mg total) by mouth daily. 07/12/21   Leary Roca, PA-C  metoprolol succinate (TOPROL-XL) 100 MG 24 hr tablet Take 1 tablet (100 mg total) by mouth daily. Take with or immediately following a meal. 07/12/21   Roddenberry, Cecille Amsterdam, PA-C  pantoprazole (PROTONIX) 20 MG tablet Take 1 tablet (20 mg total) by mouth daily. 06/23/21   Petrucelli, Samantha R, PA-C  Pyridoxine HCl (VITAMIN B-6 PO) Take 1 tablet by mouth daily.    [provider]      Allergies    Eggs or egg-derived products and Peanut-containing drug products    Review of Systems   Review of Systems   All other systems reviewed and are negative.  Physical Exam Updated Vital Signs BP 125/74 (BP Location: Right Arm)   Pulse (!) 122   Temp (!) 101.2 F (38.4 C) (Oral)   Resp (!) 22   Ht 1.803 m (5\' 11" )   Wt 101.2 kg   SpO2 97%   BMI 31.12 kg/m  Physical Exam Vitals and nursing note reviewed.  Constitutional:      General: He is not in acute distress.    Appearance: Normal appearance. He is well-developed. He is not toxic-appearing.  HENT:     Head: Normocephalic and atraumatic.  Eyes:     General: Lids are normal.     Conjunctiva/sclera: Conjunctivae normal.     Pupils: Pupils are equal, round, and reactive to light.  Neck:     Thyroid: No thyroid mass.     Trachea: No tracheal deviation.  Cardiovascular:     Rate and Rhythm: Regular rhythm. Tachycardia present.     Heart sounds: Murmur heard.    No gallop.  Pulmonary:     Effort: Pulmonary effort is normal. No respiratory distress.     Breath sounds: Normal breath sounds. No stridor. No decreased breath sounds, wheezing, rhonchi or rales.  Abdominal:     General: There is no distension.     Palpations: Abdomen is soft.     Tenderness: There  is no abdominal tenderness. There is no rebound.  Musculoskeletal:        General: No tenderness. Normal range of motion.     Cervical back: Normal range of motion and neck supple.  Skin:    General: Skin is warm and dry.     Findings: No abrasion or rash.  Neurological:     Mental Status: He is alert and oriented to person, place, and time. Mental status is at baseline.     GCS: GCS eye subscore is 4. GCS verbal subscore is 5. GCS motor subscore is 6.     Cranial Nerves: No cranial nerve deficit.     Sensory: No sensory deficit.     Motor: Motor function is intact.  Psychiatric:        Attention and Perception: Attention normal.        Speech: Speech normal.        Behavior: Behavior normal.    ED Results / Procedures / Treatments   Labs (all labs ordered are listed,  but only abnormal results are displayed) Labs Reviewed  CBC WITH DIFFERENTIAL/PLATELET - Abnormal; Notable for the following components:      Result Value   WBC 14.0 (*)    Hemoglobin 10.1 (*)    HCT 32.4 (*)    MCV 75.5 (*)    MCH 23.5 (*)    Neutro Abs 11.7 (*)    Monocytes Absolute 1.3 (*)    Abs Immature Granulocytes 0.18 (*)    All other components within normal limits  PROTIME-INR - Abnormal; Notable for the following components:   Prothrombin Time 17.1 (*)    INR 1.4 (*)    All other components within normal limits  CULTURE, BLOOD (ROUTINE X 2)  CULTURE, BLOOD (ROUTINE X 2)  RESP PANEL BY RT-PCR (FLU A&B, COVID) ARPGX2  LACTIC ACID, PLASMA  COMPREHENSIVE METABOLIC PANEL  LACTIC ACID, PLASMA  URINALYSIS, ROUTINE W REFLEX MICROSCOPIC  TROPONIN I (HIGH SENSITIVITY)    EKG EKG Interpretation  Date/Time:  Thursday August 22 2021 17:08:36 EDT Ventricular Rate:  122 PR Interval:  154 QRS Duration: 96 QT Interval:  336 QTC Calculation: 478 R Axis:   -42 Text Interpretation: Sinus tachycardia Left axis deviation Pulmonary disease pattern Minimal voltage criteria for LVH, may be normal variant ( R in aVL ) Marked ST abnormality, possible lateral subendocardial injury Abnormal ECG When compared with ECG of 06-Jul-2021 07:10, PREVIOUS ECG IS PRESENT Confirmed by Marianna Fuss ([id number]) on 08/22/2021 5:41:25 PM  Radiology DG Chest 2 View  Result Date: 08/22/2021 CLINICAL DATA:  Suspected sepsis EXAM: CHEST - 2 VIEW COMPARISON:  07/24/2021, 07/12/2021 FINDINGS: Mild left lower lobe airspace disease with partial clearing since the prior studies. No significant pleural effusion. Right lung clear Median sternotomy.  Heart size upper normal.  Vascularity normal. IMPRESSION: Slowly improving left lower lobe infiltrate likely due to resolving pneumonia. No new finding. Electronically Signed   By: Marlan Palau M.D.   On: 08/22/2021 18:22    Procedures Procedures    Medications  Ordered in ED Medications  lactated ringers infusion (has no administration in time range)  ceFEPIme (MAXIPIME) 2 g in sodium chloride 0.9 % 100 mL IVPB (has no administration in time range)  vancomycin (VANCOCIN) IVPB 1000 mg/200 mL premix (has no administration in time range)  lactated ringers bolus 1,000 mL (has no administration in time range)  fentaNYL (SUBLIMAZE) injection 50 mcg (has no administration in time range)  acetaminophen (TYLENOL) tablet 1,000  mg (has no administration in time range)  vancomycin (VANCOCIN) IVPB 1000 mg/200 mL premix (has no administration in time range)    ED Course/ Medical Decision Making/ A&P                           Medical Decision Making Amount and/or Complexity of Data Reviewed Labs: ordered. Radiology: ordered.  Risk Prescription drug management.  Patient's chest x-ray per my interpretation shows improving left lower lobe infiltrate.  Patient is EKG per my interpretation shows sinus tachycardia with some new ST depressions.  These results were reviewed with cardiology feels that patient does not meet STEMI criteria. Patient seen in triage prior to my assessment.  Code sepsis protocol started by Dr. Stevie Kern who spoke with cardiologist who is at the bedside when I saw the patient.  Patient is having a stat echocardiogram.  Patient has been ordered IV antibiotics.  Patient CBC shows a white count of 14,000 indicate leukocytosis.  Patient with mild hypokalemia potassium of 3.1 and was given 40 mill equivalents oral potassium.  Patient's lactic acid is normal.  Cardiology recommends admission to medicine service along with ID consult.   Plan will be to consult the hospitalist for inpatient admission   CRITICAL CARE Performed by: Toy Baker Total critical care time: 50 minutes Critical care time was exclusive of separately billable procedures and treating other patients. Critical care was necessary to treat or prevent imminent or  life-threatening deterioration. Critical care was time spent personally by me on the following activities: development of treatment plan with patient and/or surrogate as well as nursing, discussions with consultants, evaluation of patient's response to treatment, examination of patient, obtaining history from patient or surrogate, ordering and performing treatments and interventions, ordering and review of laboratory studies, ordering and review of radiographic studies, pulse oximetry and re-evaluation of patient's condition.         Final Clinical Impression(s) / ED Diagnoses Final diagnoses:  None    Rx / DC Orders ED Discharge Orders     None         Lorre Nick, MD 08/22/21 Nida Boatman    Lorre Nick, MD 08/22/21 1905

## 2021-08-22 NOTE — ED Notes (Signed)
MD at bedside. 

## 2021-08-22 NOTE — Progress Notes (Signed)
?  Echocardiogram ?2D Echocardiogram has been performed. ? ?Martin Stafford ?08/22/2021, 7:04 PM ?

## 2021-08-22 NOTE — Telephone Encounter (Signed)
Patient called again

## 2021-08-22 NOTE — Assessment & Plan Note (Signed)
Supplement orally and repeat labs in AM.  Check magnesium and replete as needed. ?

## 2021-08-22 NOTE — ED Notes (Signed)
Patient transported to X-ray 

## 2021-08-22 NOTE — Sepsis Progress Note (Signed)
Elink is following this code sepsis ?

## 2021-08-22 NOTE — Consult Note (Addendum)
?Cardiology Consultation:  ? ?Patient ID: Martin Stafford ?MRN: [id number]; DOB: 1991/02/02 ? ?Admit date: 08/22/2021 ?Date of Consult: 08/22/2021 ? ?PCP:  Patient, No Pcp Per (Inactive) ?  ?[facility] Providers ?Cardiologist:  Thurmon FairMihai Croitoru, MD      ? ? ?Patient Profile:  ? ?Martin Stafford is a 31 y.o. male with a hx of no prior hx but in Feb and Jan was seen in ER for sore throat and palpitations and chest pain.  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  There was trivial mitral insufficiency.  Left ventricular ejection fraction was estimated at 45 to 50% with concentric LV hypertrophy.  There was a small circumferential pericardial effusion present as well.  Undergoing  Homograft aortic root replacement using 25mm Cryo Aortic Valve. (N/A) Debridement and closure of sub-annular abscess and discharged 07/12/21 on  Daptomycin and ceftriaxone for 6 weeks -through 08/13/21 who is being seen 08/22/2021 for the evaluation of chest pain ad fevers at the request of Dr. Stevie Kernykstra. ? ?History of Present Illness:  ? ?Martin Stafford with above hx was seen today byr Dr.M. Wyline MoodBranch for cards in office.  Pt has completed IV ABX and has now noted N,V, and chest pain.  He called CT surgery and they recommended to see Cardiology.  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  In the office T 101, HR 121  ST with possible ST elevation.  Was not in Respiratory distress.  Sent to ER for further eval possible TE - and work up for fever.   ? ?EKG:  The EKG was personally reviewed and demonstrates:   ST at 123 with ST abnormality  ?Telemetry:  Telemetry was personally reviewed and demonstrates:  ST ?Labs in progress  WBC 14.0  Hgb 10 plts 302  neutro 11.7  ?INR 1.4 ?CXR pending ? ?BP 125/74 P 122 temp 101.2 R 22 RA sp02 97%  ? ?Past Medical History:  ?Diagnosis Date  ? Environmental allergies   ? Generalized skin cysts   ? ? ?Past Surgical History:  ?Procedure  Laterality Date  ? ASCENDING AORTIC ROOT REPLACEMENT N/A 07/05/2021  ? Procedure: Homograft aortic root replacement using 25mm Cryo Aortic Valve.;  Surgeon: Lovett SoxVantrigt, Peter, MD;  Location: [facility] OR;  Service: Open Heart Surgery;  Laterality: N/A;  ? COLON SURGERY    ? PILONIDAL CYST DRAINAGE N/A 07/03/2021  ? Procedure: EXCISIONAL DEBRIDEMENT CHRONIC PILONIDAL TRACT;  Surgeon: Abigail MiyamotoBlackman, Douglas, MD;  [facility];  Service: General;  Laterality: N/A;  ? TEE WITHOUT CARDIOVERSION N/A 07/01/2021  ? Procedure: TRANSESOPHAGEAL ECHOCARDIOGRAM (TEE);  Surgeon: Pricilla Riffleoss, Paula V, MD;  [facility];  Service: Cardiovascular;  Laterality: N/A;  ? TEE WITHOUT CARDIOVERSION N/A 07/05/2021  ? Procedure: TRANSESOPHAGEAL ECHOCARDIOGRAM (TEE);  Surgeon: Lovett SoxVantrigt, Peter, MD;  [facility];  Service: Open Heart Surgery;  Laterality: N/A;  ?  ? ?Home Medications:  ?Prior to Admission medications   ?Medication Sig Start Date End Date Taking? Authorizing Provider  ?acetaminophen (TYLENOL) 325 MG tablet Take 650 mg by mouth every 6 (six) hours as needed for mild pain or headache.    [provider]  ?amoxicillin (AMOXIL) 500 MG tablet Take 4 tablets (2 grams) 30-60 minutes prior to dental procedure. 08/01/21   Joylene GrapesMonge, Emily C, NP  ?Ascorbic Acid (VITAMIN C PO) Take 1 tablet by mouth daily. ?Patient not taking: Reported on 08/01/2021    [provider]  ?aspirin EC 81 MG EC tablet Take 1 tablet (81 mg total) by mouth daily. Swallow whole. 07/12/21   Leary Roca, PA-C  ?losartan (COZAAR) 25 MG tablet Take 1 tablet (25 mg total) by mouth daily. 07/12/21   Leary Roca, PA-C  ?metoprolol succinate (TOPROL-XL) 100 MG 24 hr tablet Take 1 tablet (100 mg total) by mouth daily. Take with or immediately following a meal. 07/12/21   Roddenberry, Cecille Amsterdam, PA-C  ?pantoprazole (PROTONIX) 20 MG tablet Take 1 tablet (20 mg total) by mouth daily. 06/23/21   Petrucelli, Pleas Koch, PA-C  ?Pyridoxine HCl (VITAMIN B-6 PO) Take  1 tablet by mouth daily.    [provider]  ? ? ?Inpatient Medications: ?Scheduled Meds: ? ? ?Continuous Infusions: ? ceFEPime (MAXIPIME) IV 2 g (08/22/21 1844)  ? lactated ringers 1,000 mL (08/22/21 1837)  ? lactated ringers    ? vancomycin    ? vancomycin    ? ?PRN Meds: ? ? ?Allergies:    ?Allergies  ?Allergen Reactions  ? Eggs Or Egg-Derived Products   ?  "eggs"  ? Peanut-Containing Drug Products   ?  "peanuts"  ? ? ?Social History:   ?Social History  ? ?Socioeconomic History  ? Marital status: Single  ?  Spouse name: Not on file  ? Number of children: Not on file  ? Years of education: Not on file  ? Highest education level: Not on file  ?Occupational History  ? Not on file  ?Tobacco Use  ? Smoking status: Never  ? Smokeless tobacco: Never  ?Vaping Use  ? Vaping Use: Never used  ?Substance and Sexual Activity  ? Alcohol use: No  ? Drug use: No  ? Sexual activity: Not on file  ?Other Topics Concern  ? Not on file  ?Social History Narrative  ? Not on file  ? ?Social Determinants of Health  ? ?Financial Resource Strain: Not on file  ?Food Insecurity: Not on file  ?Transportation Needs: Not on file  ?Physical Activity: Not on file  ?Stress: Not on file  ?Social Connections: Not on file  ?Intimate Partner Violence: Not on file  ?  ?Family History:   ? ?Family History  ?Problem Relation Age of Onset  ? Hypertension Father   ? Hypertension Other   ? Diabetes Other   ? Asthma Mother   ?  ? ?ROS:  ?Please see the history of present illness.  ?General:no colds or fevers, no weight changes ?Skin:no rashes or ulcers ?HEENT:no blurred vision, no congestion ?CV:see HPI ?PUL:see HPI ?GI:no diarrhea constipation or melena, no indigestion ?GU:no hematuria, no dysuria ?MS:no joint pain, no claudication ?Neuro:no syncope, no lightheadedness ?Endo:no diabetes, no thyroid disease ? ?All other ROS reviewed and negative.    ? ?Physical Exam/Data:  ? ?Vitals:  ? 08/22/21 1720 08/22/21 1722 08/22/21 1845 08/22/21 1848  ?BP:  125/74  (!) 147/71   ?Pulse: (!) 122  (!) 107 (!) 101  ?Resp: (!) 22  (!) 22 19  ?Temp: (!) 101.2 ?F (38.4 ?C)     ?TempSrc: Oral     ?SpO2: 97%  96% 97%  ?Weight:  101.2 kg    ?Height:  5\' 11"  (1.803 m)    ? ?No intake or output data in the 24 hours ending 08/22/21 1855 ? ?  08/22/2021  ?  5:22 PM 08/01/2021  ?  9:51 AM 07/30/2021  ?  4:03 PM  ?Last 3 Weights  ?Weight (lbs) 223 lb 1.7 oz 223  lb 224 lb  ?Weight (kg) 101.2 kg 101.152 kg 101.606 kg  ?   ?Body mass index is 31.12 kg/m?.  ?Exam per Dr. Merry Lofty  ? ? ?Relevant CV Studies: ?Echo  08/19/21 ? ?IMPRESSIONS  ? ? ? 1. Left ventricular ejection fraction, by estimation, is 50 to 55%. The  ?left ventricle has low normal function. The left ventricle demonstrates  ?global hypokinesis. The left ventricular internal cavity size was  ?moderately dilated. There is mild left  ?ventricular hypertrophy. Left ventricular diastolic parameters were  ?normal.  ? 2. Right ventricular systolic function is normal. The right ventricular  ?size is normal. Tricuspid regurgitation signal is inadequate for assessing  ?PA pressure.  ? 3. The mitral valve is grossly normal. No evidence of mitral valve  ?regurgitation.  ? 4. The aortic valve is grossly normal. Aortic valve regurgitation is mild  ?to moderate. Mild aortic valve stenosis. Aortic valve mean gradient  ?measures 11.0 mmHg. Aortic valve Vmax measures 2.19 m/s.  ? 5. S/p aortic root homograft with 25 mm cryopreserved aortic root  ?07/05/2021.  ? 6. The inferior vena cava is normal in size with greater than 50%  ?respiratory variability, suggesting right atrial pressure of 3 mmHg.  ? ?Conclusion(s)/Recommendation(s): Compared to intraop TEE, EF mildly more  ?reduced, LV dilated.  ? ?FINDINGS  ? Left Ventricle: Left ventricular ejection fraction, by estimation, is 50  ?to 55%. The left ventricle has low normal function. The left ventricle  ?demonstrates global hypokinesis. The left ventricular internal cavity size  ?was moderately  dilated. There is  ?mild left ventricular hypertrophy. Left ventricular diastolic parameters  ?were normal.  ? ?Right Ventricle: The right ventricular size is normal. No increase in  ?right ventricular wa

## 2021-08-22 NOTE — Patient Instructions (Signed)
Medication Instructions:  ?No Changes In Medications at this time.  ?*If you need a refill on your cardiac medications before your next appointment, please call your pharmacy* ? ?Follow-Up: ?At [facility], you and your health needs are our priority.  As part of our continuing mission to provide you with exceptional heart care, we have created designated Provider Care Teams.  These Care Teams include your primary Cardiologist (physician) and Advanced Practice Providers (APPs -  Physician Assistants and Nurse Practitioners) who all work together to provide you with the care you need, when you need it. ? ?Your next appointment:   ?TBD ? ?The format for your next appointment:   ?In Person ? ?Provider:   ?Thurmon Fair, MD   ? ?Other Instructions ?PLEASE REPORT DIRECTLY TO THE EMERGENCY DEPARTMENT  ? ? ? ? ?  ?

## 2021-08-22 NOTE — Assessment & Plan Note (Addendum)
History of recurrent pilonidal cyst.  He is status post surgical debridement of infected pilonidal tissue by orthopedics, Dr. Ninfa Linden, on 07/03/2021.  Gluteal cleft surgical incision is healing well without evidence of active infection at time of admission. ?

## 2021-08-22 NOTE — ED Triage Notes (Signed)
Pt c/o N/V, feverx4 days. Pt was sent by cardiologist, because they heard "leakage in his heart" and told him to come here. Pt had heart surgery 2 mos ago.  ?

## 2021-08-22 NOTE — ED Provider Notes (Signed)
Brief note ? ?31 year old gentleman with recent history of aortic valve endocarditis, recently finished course of IV antibiotics.  Followed up with cardiology clinic today, having fever, tachycardia, chest pain, sent to ER for further evaluation.  Currently patient vitals noted to be febrile, tachycardic.  He is not ill-appearing at this time.  His EKG has some ST depressions that appear new when compared to prior as well as elevation in aVR.  I discussed the case with Dr. Margaretann Loveless on call for [facility]iology group.  She reviewed the case and reviewed EKG.  She feels that this most likely is related to his endocarditis and less likely ACS however would advise checking cardiac enzymes and will arrange for stat echocardiogram.  She feels the EKG changes could be due to rate and LVH, no STEMI. Their team will see patient as consult but would advise admitting to medicine. I have placed sepsis order set including blood cultures, broad-spectrum antibiotics.  I have discussed case with triage staff to prioritize patient for room as soon as possible and requested treatment be initiated in triage for now.  ?  ?Lucrezia Starch, MD ?08/22/21 1801 ? ?

## 2021-08-22 NOTE — H&P (View-Only) (Signed)
?Cardiology Consultation:  ? ?Patient ID: Martin Stafford ?MRN: [id number]; DOB: 08/10/1990 ? ?Admit date: 08/22/2021 ?Date of Consult: 08/22/2021 ? ?PCP:  Patient, No Pcp Per (Inactive) ?  ?[facility] Providers ?Cardiologist:  Mihai Croitoru, MD      ? ? ?Patient Profile:  ? ?Martin Stafford is a 31 y.o. male with a hx of no prior hx but in Feb and Jan was seen in ER for sore throat and palpitations and chest pain.  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  There was trivial mitral insufficiency.  Left ventricular ejection fraction was estimated at 45 to 50% with concentric LV hypertrophy.  There was a small circumferential pericardial effusion present as well.  Undergoing  Homograft aortic root replacement using 25mm Cryo Aortic Valve. (N/A) Debridement and closure of sub-annular abscess and discharged 07/12/21 on  Daptomycin and ceftriaxone for 6 weeks -through 08/13/21 who is being seen 08/22/2021 for the evaluation of chest pain ad fevers at the request of Dr. Dykstra. ? ?History of Present Illness:  ? ?Mr. Michelini with above hx was seen today byr Dr.M. Branch for cards in office.  Pt has completed IV ABX and has now noted N,V, and chest pain.  He called CT surgery and they recommended to see Cardiology.  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  In the office T 101, HR 121  ST with possible ST elevation.  Was not in Respiratory distress.  Sent to ER for further eval possible TE - and work up for fever.   ? ?EKG:  The EKG was personally reviewed and demonstrates:   ST at 123 with ST abnormality  ?Telemetry:  Telemetry was personally reviewed and demonstrates:  ST ?Labs in progress  WBC 14.0  Hgb 10 plts 302  neutro 11.7  ?INR 1.4 ?CXR pending ? ?BP 125/74 P 122 temp 101.2 R 22 RA sp02 97%  ? ?Past Medical History:  ?Diagnosis Date  ? Environmental allergies   ? Generalized skin cysts   ? ? ?Past Surgical History:  ?Procedure  Laterality Date  ? ASCENDING AORTIC ROOT REPLACEMENT N/A 07/05/2021  ? Procedure: Homograft aortic root replacement using 25mm Cryo Aortic Valve.;  Surgeon: Vantrigt, Peter, MD;  Location: [facility] OR;  Service: Open Heart Surgery;  Laterality: N/A;  ? COLON SURGERY    ? PILONIDAL CYST DRAINAGE N/A 07/03/2021  ? Procedure: EXCISIONAL DEBRIDEMENT CHRONIC PILONIDAL TRACT;  Surgeon: Blackman, Douglas, MD;  Location: [facility] OR;  Service: General;  Laterality: N/A;  ? TEE WITHOUT CARDIOVERSION N/A 07/01/2021  ? Procedure: TRANSESOPHAGEAL ECHOCARDIOGRAM (TEE);  Surgeon: Ross, Paula V, MD;  Location: [facility] ENDOSCOPY;  Service: Cardiovascular;  Laterality: N/A;  ? TEE WITHOUT CARDIOVERSION N/A 07/05/2021  ? Procedure: TRANSESOPHAGEAL ECHOCARDIOGRAM (TEE);  Surgeon: Vantrigt, Peter, MD;  Location: [facility] OR;  Service: Open Heart Surgery;  Laterality: N/A;  ?  ? ?Home Medications:  ?Prior to Admission medications   ?Medication Sig Start Date End Date Taking? Authorizing Provider  ?acetaminophen (TYLENOL) 325 MG tablet Take 650 mg by mouth every 6 (six) hours as needed for mild pain or headache.    [provider]  ?amoxicillin (AMOXIL) 500 MG tablet Take 4 tablets (2 grams) 30-60 minutes prior to dental procedure. 08/01/21   Monge, Emily C, NP  ?Ascorbic Acid (VITAMIN C PO) Take 1 tablet by mouth daily. ?Patient not taking: Reported on 08/01/2021    [provider]  ?aspirin EC 81 MG EC tablet Take 1 tablet (81 mg total) by mouth daily. Swallow whole. 07/12/21   Leary Roca, PA-C  ?losartan (COZAAR) 25 MG tablet Take 1 tablet (25 mg total) by mouth daily. 07/12/21   Leary Roca, PA-C  ?metoprolol succinate (TOPROL-XL) 100 MG 24 hr tablet Take 1 tablet (100 mg total) by mouth daily. Take with or immediately following a meal. 07/12/21   Roddenberry, Cecille Amsterdam, PA-C  ?pantoprazole (PROTONIX) 20 MG tablet Take 1 tablet (20 mg total) by mouth daily. 06/23/21   Petrucelli, Pleas Koch, PA-C  ?Pyridoxine HCl (VITAMIN B-6 PO) Take  1 tablet by mouth daily.    [provider]  ? ? ?Inpatient Medications: ?Scheduled Meds: ? ? ?Continuous Infusions: ? ceFEPime (MAXIPIME) IV 2 g (08/22/21 1844)  ? lactated ringers 1,000 mL (08/22/21 1837)  ? lactated ringers    ? vancomycin    ? vancomycin    ? ?PRN Meds: ? ? ?Allergies:    ?Allergies  ?Allergen Reactions  ? Eggs Or Egg-Derived Products   ?  "eggs"  ? Peanut-Containing Drug Products   ?  "peanuts"  ? ? ?Social History:   ?Social History  ? ?Socioeconomic History  ? Marital status: Single  ?  Spouse name: Not on file  ? Number of children: Not on file  ? Years of education: Not on file  ? Highest education level: Not on file  ?Occupational History  ? Not on file  ?Tobacco Use  ? Smoking status: Never  ? Smokeless tobacco: Never  ?Vaping Use  ? Vaping Use: Never used  ?Substance and Sexual Activity  ? Alcohol use: No  ? Drug use: No  ? Sexual activity: Not on file  ?Other Topics Concern  ? Not on file  ?Social History Narrative  ? Not on file  ? ?Social Determinants of Health  ? ?Financial Resource Strain: Not on file  ?Food Insecurity: Not on file  ?Transportation Needs: Not on file  ?Physical Activity: Not on file  ?Stress: Not on file  ?Social Connections: Not on file  ?Intimate Partner Violence: Not on file  ?  ?Family History:   ? ?Family History  ?Problem Relation Age of Onset  ? Hypertension Father   ? Hypertension Other   ? Diabetes Other   ? Asthma Mother   ?  ? ?ROS:  ?Please see the history of present illness.  ?General:no colds or fevers, no weight changes ?Skin:no rashes or ulcers ?HEENT:no blurred vision, no congestion ?CV:see HPI ?PUL:see HPI ?GI:no diarrhea constipation or melena, no indigestion ?GU:no hematuria, no dysuria ?MS:no joint pain, no claudication ?Neuro:no syncope, no lightheadedness ?Endo:no diabetes, no thyroid disease ? ?All other ROS reviewed and negative.    ? ?Physical Exam/Data:  ? ?Vitals:  ? 08/22/21 1720 08/22/21 1722 08/22/21 1845 08/22/21 1848  ?BP:  125/74  (!) 147/71   ?Pulse: (!) 122  (!) 107 (!) 101  ?Resp: (!) 22  (!) 22 19  ?Temp: (!) 101.2 ?F (38.4 ?C)     ?TempSrc: Oral     ?SpO2: 97%  96% 97%  ?Weight:  101.2 kg    ?Height:  5\' 11"  (1.803 m)    ? ?No intake or output data in the 24 hours ending 08/22/21 1855 ? ?  08/22/2021  ?  5:22 PM 08/01/2021  ?  9:51 AM 07/30/2021  ?  4:03 PM  ?Last 3 Weights  ?Weight (lbs) 223 lb 1.7 oz 223  lb 224 lb  ?Weight (kg) 101.2 kg 101.152 kg 101.606 kg  ?   ?Body mass index is 31.12 kg/m?.  ?Exam per Dr. Merry Lofty  ? ? ?Relevant CV Studies: ?Echo  08/19/21 ? ?IMPRESSIONS  ? ? ? 1. Left ventricular ejection fraction, by estimation, is 50 to 55%. The  ?left ventricle has low normal function. The left ventricle demonstrates  ?global hypokinesis. The left ventricular internal cavity size was  ?moderately dilated. There is mild left  ?ventricular hypertrophy. Left ventricular diastolic parameters were  ?normal.  ? 2. Right ventricular systolic function is normal. The right ventricular  ?size is normal. Tricuspid regurgitation signal is inadequate for assessing  ?PA pressure.  ? 3. The mitral valve is grossly normal. No evidence of mitral valve  ?regurgitation.  ? 4. The aortic valve is grossly normal. Aortic valve regurgitation is mild  ?to moderate. Mild aortic valve stenosis. Aortic valve mean gradient  ?measures 11.0 mmHg. Aortic valve Vmax measures 2.19 m/s.  ? 5. S/p aortic root homograft with 25 mm cryopreserved aortic root  ?07/05/2021.  ? 6. The inferior vena cava is normal in size with greater than 50%  ?respiratory variability, suggesting right atrial pressure of 3 mmHg.  ? ?Conclusion(s)/Recommendation(s): Compared to intraop TEE, EF mildly more  ?reduced, LV dilated.  ? ?FINDINGS  ? Left Ventricle: Left ventricular ejection fraction, by estimation, is 50  ?to 55%. The left ventricle has low normal function. The left ventricle  ?demonstrates global hypokinesis. The left ventricular internal cavity size  ?was moderately  dilated. There is  ?mild left ventricular hypertrophy. Left ventricular diastolic parameters  ?were normal.  ? ?Right Ventricle: The right ventricular size is normal. No increase in  ?right ventricular wa

## 2021-08-22 NOTE — Telephone Encounter (Signed)
Patient reports having vomiting, headache, and chills since completing antibiotics (daptomycin and ceftriaxone) on Thursday and PICC line removed on Monday. He has intermittent chest pain that he describes as "pressure like something is sitting on my chest." Patient stated he felt better while on ABX and wants to know if he should take more. Recommended that he contact his cardiac surgeon. He stated cardiac surgeon's office said to call cardiologist. Temp yesterday 99.0. Spoke with Dr. Sallyanne Kuster who recommends patient be seen today. Appointment made with patient for DOD visit at 4:30 today.  ?

## 2021-08-23 ENCOUNTER — Inpatient Hospital Stay (HOSPITAL_COMMUNITY): Payer: BC Managed Care – PPO

## 2021-08-23 ENCOUNTER — Encounter (HOSPITAL_COMMUNITY): Payer: Self-pay | Admitting: Internal Medicine

## 2021-08-23 ENCOUNTER — Inpatient Hospital Stay (HOSPITAL_COMMUNITY): Payer: BC Managed Care – PPO | Admitting: Anesthesiology

## 2021-08-23 ENCOUNTER — Encounter (HOSPITAL_COMMUNITY)
Admission: EM | Disposition: A | Discharge status: Other Acute Inpatient Hospital | Payer: Self-pay | Source: Home / Self Care | Attending: Family Medicine

## 2021-08-23 DIAGNOSIS — I351 Nonrheumatic aortic (valve) insufficiency: Secondary | ICD-10-CM

## 2021-08-23 DIAGNOSIS — I330 Acute and subacute infective endocarditis: Secondary | ICD-10-CM

## 2021-08-23 DIAGNOSIS — R6510 Systemic inflammatory response syndrome (SIRS) of non-infectious origin without acute organ dysfunction: Secondary | ICD-10-CM | POA: Diagnosis not present

## 2021-08-23 HISTORY — PX: TEE WITHOUT CARDIOVERSION: SHX5443

## 2021-08-23 LAB — ECHOCARDIOGRAM LIMITED
AV Mean grad: 12 mmHg
AV Peak grad: 23 mmHg
Ao pk vel: 2.4 m/s
Height: 71 in
P 1/2 time: 181 msec
Weight: 3569.69 oz

## 2021-08-23 LAB — BASIC METABOLIC PANEL
Anion gap: 9 (ref 5–15)
BUN: 7 mg/dL (ref 6–20)
CO2: 24 mmol/L (ref 22–32)
Calcium: 8.7 mg/dL — ABNORMAL LOW (ref 8.9–10.3)
Chloride: 100 mmol/L (ref 98–111)
Creatinine, Ser: 0.81 mg/dL (ref 0.61–1.24)
GFR, Estimated: >60 mL/min (ref >60)
Glucose, Bld: 106 mg/dL — ABNORMAL HIGH (ref 70–99)
Potassium: 3.5 mmol/L (ref 3.5–5.1)
Sodium: 133 mmol/L — ABNORMAL LOW (ref 135–145)

## 2021-08-23 LAB — CBC
HCT: 30.6 % — ABNORMAL LOW (ref 39.0–52.0)
Hemoglobin: 9.6 g/dL — ABNORMAL LOW (ref 13.0–17.0)
MCH: 23.8 pg — ABNORMAL LOW (ref 26.0–34.0)
MCHC: 31.4 g/dL (ref 30.0–36.0)
MCV: 75.7 fL — ABNORMAL LOW (ref 80.0–100.0)
Platelets: 268 10*3/uL (ref 150–400)
RBC: 4.04 MIL/uL — ABNORMAL LOW (ref 4.22–5.81)
RDW: 14.8 % (ref 11.5–15.5)
WBC: 11.3 10*3/uL — ABNORMAL HIGH (ref 4.0–10.5)
nRBC: 0 % (ref 0.0–0.2)

## 2021-08-23 LAB — RESP PANEL BY RT-PCR (FLU A&B, COVID) ARPGX2
Influenza A by PCR: NEGATIVE
Influenza B by PCR: NEGATIVE
SARS Coronavirus 2 by RT PCR: NEGATIVE

## 2021-08-23 SURGERY — ECHOCARDIOGRAM, TRANSESOPHAGEAL
Anesthesia: Monitor Anesthesia Care

## 2021-08-23 MED ORDER — PROPOFOL 500 MG/50ML IV EMUL
INTRAVENOUS | Status: DC | PRN
  Administered 2021-08-23: 100 ug/kg/min via INTRAVENOUS

## 2021-08-23 MED ORDER — LACTATED RINGERS IV SOLN: INTRAVENOUS | Status: DC

## 2021-08-23 MED ORDER — PROPOFOL 10 MG/ML IV BOLUS
INTRAVENOUS | Status: DC | PRN
  Administered 2021-08-23 (×2): 20 mg via INTRAVENOUS

## 2021-08-23 MED ORDER — ACETAMINOPHEN 500 MG PO TABS
ORAL_TABLET | ORAL | Status: AC
  Filled 2021-08-23: qty 2

## 2021-08-23 MED ORDER — SODIUM CHLORIDE 0.9 % IV SOLN: INTRAVENOUS | Status: DC

## 2021-08-23 MED ORDER — MELATONIN 3 MG PO TABS
3.0000 mg | ORAL_TABLET | Freq: Every evening | ORAL | Status: DC | PRN
  Administered 2021-08-23: 3 mg via ORAL
  Filled 2021-08-23: qty 1

## 2021-08-23 MED ORDER — METRONIDAZOLE 500 MG/100ML IV SOLN
500.0000 mg | Freq: Two times a day (BID) | INTRAVENOUS | Status: DC
  Administered 2021-08-24 – 2021-08-26 (×7): 500 mg via INTRAVENOUS
  Filled 2021-08-23 (×9): qty 100

## 2021-08-23 MED ORDER — MIDAZOLAM HCL 2 MG/2ML IJ SOLN
INTRAMUSCULAR | Status: DC | PRN
  Administered 2021-08-23: 2 mg via INTRAVENOUS

## 2021-08-23 MED ORDER — ACETAMINOPHEN 500 MG PO TABS
1000.0000 mg | ORAL_TABLET | Freq: Once | ORAL | Status: AC | PRN
  Administered 2021-08-23: 1000 mg via ORAL

## 2021-08-23 NOTE — ED Notes (Signed)
Pt care taken, no complaints at this time. 

## 2021-08-23 NOTE — Progress Notes (Signed)
?   08/23/21 2055  ?Assess: MEWS Score  ?Temp (!) 102.1 ?F (38.9 ?C)  ?BP (!) 153/89  ?Pulse Rate (!) 117  ?ECG Heart Rate (!) 119  ?Resp 19  ?SpO2 97 %  ?O2 Device Room Air  ?Assess: MEWS Score  ?MEWS Temp 2  ?MEWS Systolic 0  ?MEWS Pulse 2  ?MEWS RR 0  ?MEWS LOC 0  ?MEWS Score 4  ?MEWS Score Color Red  ?Assess: if the MEWS score is Yellow or Red  ?Were vital signs taken at a resting state? Yes  ?Focused Assessment No change from prior assessment  ?Early Detection of Sepsis Score *See Row Information* Low  ?MEWS guidelines implemented *See Row Information* Yes  ?Escalate  ?MEWS: Escalate Red: discuss with charge nurse/RN and provider, consider discussing with RRT  ?Notify: Charge Nurse/RN  ?Name of Charge Nurse/RN Notified Shanell RN  ?Date Charge Nurse/RN Notified 08/23/21  ?Time Charge Nurse/RN Notified 2107  ?Notify: Provider  ?Provider Name/Title Dr.Gardner  ?Date Provider Notified 08/23/21  ?Time Provider Notified 2107  ?Notification Type Page  ?Notification Reason Other (Comment) ?(Temp=102.9)  ?Document  ?Patient Outcome Not stable and remains on department  ?Progress note created (see row info) Yes  ? ? ?

## 2021-08-23 NOTE — Interval H&P Note (Signed)
History and Physical Interval Note: ? ?08/23/2021 ?11:06 AM ? ?Martin Stafford  has presented today for surgery, with the diagnosis of fever post valve surgery.  The various methods of treatment have been discussed with the patient and family. After consideration of risks, benefits and other options for treatment, the patient has consented to  Procedure(s): ?TRANSESOPHAGEAL ECHOCARDIOGRAM (TEE) (N/A) as a surgical intervention.  The patient's history has been reviewed, patient examined, no change in status, stable for surgery.  I have reviewed the patient's chart and labs.  Questions were answered to the patient's satisfaction.   ? ? ?Martin Stafford ? ? ?

## 2021-08-23 NOTE — Progress Notes (Signed)
?PROGRESS NOTE ? ? 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 Madlyn Frankel on daptomycin and ceftriaxone and completed the same 08/13/2021 under guidance of ID ?Follow-up TTE 08/19/2021 = hypokinesis AV regurg the source was felt to be a pilonidal cyst ? ?That he had ? ?Developed chills malaise chest discomfort nausea vomiting since 4/9 sternotomy site has healed no erythema ?Evaluated in cardiology clinic 4/13 and concerns for worsening aortic insufficiency sepsis criteria met on admission with tachycardia chest pain and was febrile and sent to the emergency room ?Found to be in sinus tach at 123 WBC 14 platelet 302 INR 1.4 Tmax 101.2 ? ?Cardiology evaluated did stat TTE ? ?Hospital-Problem based course ? 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 Darcey Nora ?Discussed with Dr. Darcey Nora this morning who is aware of patient and will see after TEE ?Await cardiology further input ?Preliminary blood cultures X2 on 4/13 = NGTD ?Defer further management to CVTS ?NS 100 cc/H ?Hypokalemia on admission ?Replaced orally now normal ?Likely anemia chronic disease ?MCV is low hemoglobin is 9.6-monitor trends ? ? ?DVT prophylaxis: Heparin full ?Code Status: Full ?Family Communication:  ?Disposition: Discussed with the patient's mother Lorriane Shire at the bedside and have coordinated with cardiologist and CVTS >30 minutes care coordination time ? ? ? ?Status is: Inpatient ?Remains inpatient appropriate because: High risk and will probably need further surgery versus prolonged antibiotics dependent on consultant input ?  ?Consultants:  ?Cardiology Dr. Margaretann Loveless ? cardiothoracic surgery Dr. Darcey Nora ? ?Procedures: TTE 4/13 ?TEE 4/14 pending ? ?Antimicrobials:  ? ?Currently vancomycin and cefepime   ? ? ?Subjective: ? ?Still feeling poorly, malaise, some nausea no vomiting-hungry but he has been told to hold off on diet until cardiology can coordinate TEE later this morning ? ?Objective: ?Vitals:  ? 08/23/21 0356 08/23/21 0400 08/23/21 0500 08/23/21 0600  ?BP: 140/75 (!) 146/79 (!) 142/78 132/89  ?Pulse: (!) 120 (!) 119 (!) 120 (!) 114  ?Resp: (!) 22 (!) 23 (!) 28 18  ?Temp:      ?TempSrc:      ?SpO2: (!) 89% 96% 97% 97%  ?Weight:      ?Height:      ? ? ?Intake/Output Summary (Last 24 hours) at 08/23/2021 0848 ?Last data filed at 08/22/2021 1914 ?Gross per 24 hour  ?Intake 100 ml  ?Output --  ?Net 100 ml  ? ?Filed Weights  ? 08/22/21 1722  ?Weight: 101.2 kg  ? ? ?Examination: ? ?Alert looks older than stated age no icterus no pallor ?Slightly obese BMI 29 ?Abdomen soft no rebound no guarding ?Genitourinary rectal exam deferred ?Midline chest scar holosystolic murmur? ?Neurologically intact ?Euthymic congruent ? ?Data Reviewed: personally reviewed  ? ?CBC ?   ?Component Value Date/Time  ? WBC 11.3 (H) 08/23/2021 0245  ? RBC 4.04 (L) 08/23/2021 0245  ? HGB 9.6 (L) 08/23/2021 0245  ? HCT 30.6 (L) 08/23/2021 0245  ? PLT 268 08/23/2021 0245  ? MCV 75.7 (L) 08/23/2021 0245  ? MCH 23.8 (L) 08/23/2021 0245  ? MCHC 31.4 08/23/2021 0245  ? RDW 14.8 08/23/2021 0245  ? LYMPHSABS 0.7 08/22/2021 1740  ? MONOABS 1.3 (H) 08/22/2021 1740  ? EOSABS 0.0 08/22/2021 1740  ? BASOSABS 0.0 08/22/2021 1740  ? ? ?  Latest Ref Rng & Units 08/23/2021  ?  2:45 AM 08/22/2021  ?  5:40 PM 07/12/2021  ?  4:10 AM  ?CMP  ?Glucose 70 - 99 mg/dL 106   102   103    ?BUN 6 - 20 mg/dL _0 ?Creatinine 0.61 - 1.24 mg/dL 0.81   0.91   0.73    ?Sodium 135 - 145 mmol/L 133   133   129    ?Potassium 3.5 - 5.1 mmol/L 3.5   3.1   3.8    ?Chloride 98 - 111 mmol/L 100   98   92    ?CO2 22 - 32 mmol/L _1 ?Calcium 8.9 - 10.3 mg/dL 8.7   9.1   8.8    ?Total Protein 6.5 - 8.1 g/dL  8.3     ?Total Bilirubin 0.3 - 1.2 mg/dL  1.2     ?Alkaline Phos  38 - 126 U/L  64     ?AST 15 - 41 U/L  11     ?ALT 0 - 44 U/L  16     ? ? ? ?Radiology Studies: ?DG Chest 2 View ? ?Result Date: 08/22/2021 ?CLINICAL DATA:  Suspected sepsis EXAM: CHEST - 2 VIEW COMPARISON:  07/24/2021, 07/12/2021 FINDINGS: Mild left lower lobe airspace disease with partial clearing since the prior studies. No significant pleural effusion. Right lung clear Median sternotomy.  Heart size upper normal.  Vascularity normal. IMPRESSION: Slowly improving left lower lobe infiltrate likely due to resolving pneumonia. No new finding. Electronically Signed   By: Franchot Gallo M.D.   On: 08/22/2021 18:22  ? ?ECHOCARDIOGRAM LIMITED ? ?Result Date: 08/22/2021 ?   ECHOCARDIOGRAM LIMITED REPORT   Patient Name:   Martin Stafford Date of Exam: 08/22/2021 Medical Rec #:  [id number]         Height:       71.0 in Accession #:    [id number]        Weight:       223.1 lb Date of Birth:  [birth year]         BSA:          2.209 m? Patient Age:    31 years          BP:           125/74 mmHg Patient Gender: M                 HR:           108 bpm. Exam Location:  Inpatient Procedure: Limited Echo, Limited Color Doppler and Cardiac Doppler Indications:    chest pain  History:        Patient has prior history of Echocardiogram examinations, most                 recent 08/19/2021. Endocarditis; Signs/Symptoms:elevated                 troponin. 07/05/21: Homograft aortic root replacement using a 25                 mm cryopreserved aortic root. Debridement and closure of                 subannular abscess in the left ventricular endocardium of the                 ventricular septum.  Sonographer:    Johny Chess RDCS Referring Phys: [id number] [place]  1. Leaflets of aortic valve are not well  visualized compared to prior. Native non-coronary cusp appears thickened, and along the annulus the post-operative material appear hypermobile. There is a centrally originating jet of aortic valve regurgitation with an  eccentric component. AR is at least moderate, and may be underestimated on this limited study due to eccentricity of jet. PHT, diastolic aorta flow reversals, and apparent lack of central coaptation of the valve would suggest increased degree of regurgitation. Recommend TEE to evaluate further. The aortic valve has been repaired/replaced. Aortic valve regurgitation is moderate. Aortic valve mean gradient measures 12.0 mmHg.  2. Left ventricular ejection fraction, by estimation, is 55%. The left ventricle has low normal function. The left ventricle has no regional wall motion abnormalities.  3. Right ventricular systolic function is normal. The right ventricular size is normal.  4. The mitral valve is grossly normal.  5. Aortic root/ascending aorta has been repaired/replaced. Comparison(s): Compared to study from 08/19/21, LV function appears similar. Aortic valve regurgitation is more prominent on prior exam on side by side comparison. FINDINGS  Left Ventricle: Left ventricular ejection fraction, by estimation, is 55%. The left ventricle has low normal function. The left ventricle has no regional wall motion abnormalities. Right Ventricle: The right ventricular size is normal. Right ventricular systolic function is normal. Pericardium: There is no evidence of pericardial effusion. Mitral Valve: The mitral valve is grossly normal. Tricuspid Valve: The tricuspid valve is grossly normal. Aortic Valve: Leaflets of aortic valve are not well visualized compared to prior. Native non-coronary cusp appears thickened, and along the annulus the post-operative material appear hypermobile. There is a centrally originating jet of aortic valve regurgitation with an eccentric component. AR is at least moderate, and may be underestimated on this limited study due to eccentricity of jet. PHT, diastolic aorta flow reversals, and apparent lack of central coaptation of the valve would suggest increased degree of regurgitation. Recommend  TEE to evaluate further. The aortic valve has been repaired/replaced. Aortic valve regurgitation is moderate. Aortic regurgitation PHT measures 181 msec. Aortic valve mean gradient measures 12.0 mmHg. Aortic valve peak g

## 2021-08-23 NOTE — Progress Notes (Signed)
? ?Progress Note ? ?Patient Name: Martin Stafford ?Date of Encounter: 08/23/2021 ? ?[facility] Cardiologist: Thurmon Fair, MD  ? ?Subjective  ? ?Chills overnight and this morning otherwise no significant chest pain. ? ?Inpatient Medications  ?  ?Scheduled Meds: ? aspirin EC  81 mg Oral Daily  ? heparin  5,000 Units Subcutaneous Q8H  ? sodium chloride flush  3 mL Intravenous Q12H  ? ?Continuous Infusions: ? ceFEPime (MAXIPIME) IV Stopped (08/23/21 0440)  ? vancomycin    ? ?PRN Meds: ?acetaminophen **OR** acetaminophen, ondansetron **OR** ondansetron (ZOFRAN) IV  ? ?Vital Signs  ?  ?Vitals:  ? 08/23/21 0900 08/23/21 0911 08/23/21 0937 08/23/21 0955  ?BP: 129/64   (!) 143/85  ?Pulse: (!) 105 (!) 108  (!) 101  ?Resp: (!) 22 20  20   ?Temp:   99.5 ?F (37.5 ?C) 98.8 ?F (37.1 ?C)  ?TempSrc:   Oral Oral  ?SpO2: 96% 97%  100%  ?Weight:    97.4 kg  ?Height:    5\' 11"  (1.803 m)  ? ? ?Intake/Output Summary (Last 24 hours) at 08/23/2021 1014 ?Last data filed at 08/22/2021 1914 ?Gross per 24 hour  ?Intake 100 ml  ?Output --  ?Net 100 ml  ? ? ?  08/23/2021  ?  9:55 AM 08/22/2021  ?  5:22 PM 08/01/2021  ?  9:51 AM  ?Last 3 Weights  ?Weight (lbs) 214 lb 11.2 oz 223 lb 1.7 oz 223 lb  ?Weight (kg) 97.387 kg 101.2 kg 101.152 kg  ?   ? ?Telemetry  ?  ?SR - Personally Reviewed ? ?ECG  ?  ?pending - Personally Reviewed ? ?Physical Exam  ? ?GEN: No acute distress.   ?Neck: No JVD ?Cardiac: tachycardic. 2/6 HDM  ?Respiratory: Clear to auscultation bilaterally. ?GI: Soft, nontender, non-distended  ?MS: trace edema; No deformity. ?Neuro:  Nonfocal  ?Psych: Normal affect  ? ?Labs  ?  ?High Sensitivity Troponin:   ?Recent Labs  ?Lab 08/22/21 ?1740 08/22/21 ?2006  ?TROPONINIHS 45* 57*  ?   ?Chemistry ?Recent Labs  ?Lab 08/22/21 ?1740 08/22/21 ?2006 08/23/21 ?0245  ?NA 133*  --  133*  ?K 3.1*  --  3.5  ?CL 98  --  100  ?CO2 20*  --  24  ?GLUCOSE 102*  --  106*  ?BUN 8  --  7  ?CREATININE 0.91  --  0.81  ?CALCIUM 9.1  --  8.7*  ?MG  --  1.8  --    ?PROT 8.3*  --   --   ?ALBUMIN 3.2*  --   --   ?AST 11*  --   --   ?ALT 16  --   --   ?ALKPHOS 64  --   --   ?BILITOT 1.2  --   --   ?GFRNONAA >60  --  >60  ?ANIONGAP 15  --  9  ?  ?Lipids No results for input(s): CHOL, TRIG, HDL, LABVLDL, LDLCALC, CHOLHDL in the last 168 hours.  ?Hematology ?Recent Labs  ?Lab 08/22/21 ?1740 08/23/21 ?0245  ?WBC 14.0* 11.3*  ?RBC 4.29 4.04*  ?HGB 10.1* 9.6*  ?HCT 32.4* 30.6*  ?MCV 75.5* 75.7*  ?MCH 23.5* 23.8*  ?MCHC 31.2 31.4  ?RDW 15.0 14.8  ?PLT 302 268  ? ?Thyroid No results for input(s): TSH, FREET4 in the last 168 hours.  ?BNPNo results for input(s): BNP, PROBNP in the last 168 hours.  ?DDimer No results for input(s): DDIMER in the last 168 hours.  ? ?Radiology  ?  ?  DG Chest 2 View ? ?Result Date: 08/22/2021 ?CLINICAL DATA:  Suspected sepsis EXAM: CHEST - 2 VIEW COMPARISON:  07/24/2021, 07/12/2021 FINDINGS: Mild left lower lobe airspace disease with partial clearing since the prior studies. No significant pleural effusion. Right lung clear Median sternotomy.  Heart size upper normal.  Vascularity normal. IMPRESSION: Slowly improving left lower lobe infiltrate likely due to resolving pneumonia. No new finding. Electronically Signed   By: Marlan Palau M.D.   On: 08/22/2021 18:22  ? ?ECHOCARDIOGRAM LIMITED ? ?Result Date: 08/22/2021 ?   ECHOCARDIOGRAM LIMITED REPORT   Patient Name:   [name] Date of Exam: 08/22/2021 Medical Rec #:  [id number]         Height:       71.0 in Accession #:    [id number]        Weight:       223.1 lb Date of Birth:  04/16/1991         BSA:          2.209 m? Patient Age:    31 years          BP:           125/74 mmHg Patient Gender: M                 HR:           108 bpm. Exam Location:  Inpatient Procedure: Limited Echo, Limited Color Doppler and Cardiac Doppler Indications:    chest pain  History:        Patient has prior history of Echocardiogram examinations, most                 recent 08/19/2021. Endocarditis; Signs/Symptoms:elevated                  troponin. 07/05/21: Homograft aortic root replacement using a 25                 mm cryopreserved aortic root. Debridement and closure of                 subannular abscess in the left ventricular endocardium of the                 ventricular septum.  Sonographer:    Delcie Roch RDCS Referring Phys: [id number] Lynda Rainwater A Zahria Ding IMPRESSIONS  1. Leaflets of aortic valve are not well visualized compared to prior. Native non-coronary cusp appears thickened, and along the annulus the post-operative material appear hypermobile. There is a centrally originating jet of aortic valve regurgitation with an eccentric component. AR is at least moderate, and may be underestimated on this limited study due to eccentricity of jet. PHT, diastolic aorta flow reversals, and apparent lack of central coaptation of the valve would suggest increased degree of regurgitation. Recommend TEE to evaluate further. The aortic valve has been repaired/replaced. Aortic valve regurgitation is moderate. Aortic valve mean gradient measures 12.0 mmHg.  2. Left ventricular ejection fraction, by estimation, is 55%. The left ventricle has low normal function. The left ventricle has no regional wall motion abnormalities.  3. Right ventricular systolic function is normal. The right ventricular size is normal.  4. The mitral valve is grossly normal.  5. Aortic root/ascending aorta has been repaired/replaced. Comparison(s): Compared to study from 08/19/21, LV function appears similar. Aortic valve regurgitation is more prominent on prior exam on side by side comparison. FINDINGS  Left Ventricle: Left ventricular ejection fraction, by estimation, is 55%. The left ventricle  has low normal function. The left ventricle has no regional wall motion abnormalities. Right Ventricle: The right ventricular size is normal. Right ventricular systolic function is normal. Pericardium: There is no evidence of pericardial effusion. Mitral Valve: The mitral valve  is grossly normal. Tricuspid Valve: The tricuspid valve is grossly normal. Aortic Valve: Leaflets of aortic valve are not well visualized compared to prior. Native non-coronary cusp appears thickened, and along the annulus the post-operative material appear hypermobile. There is a centrally originating jet of aortic valve regurgitation with an eccentric component. AR is at least moderate, and may be underestimated on this limited study due to eccentricity of jet. PHT, diastolic aorta flow reversals, and apparent lack of central coaptation of the valve would suggest increased degree of regurgitation. Recommend TEE to evaluate further. The aortic valve has been repaired/replaced. Aortic valve regurgitation is moderate. Aortic regurgitation PHT measures 181 msec. Aortic valve mean gradient measures 12.0 mmHg. Aortic valve peak gradient measures 23.0 mmHg. Pulmonic Valve: The pulmonic valve was grossly normal. Aorta: The aortic root/ascending aorta has been repaired/replaced. LEFT VENTRICLE PLAX 2D LVOT diam:     3.40 cm LVOT Area:     9.08 cm?  AORTIC VALVE AV Vmax:      240.00 cm/s AV Vmean:     162.000 cm/s AV VTI:       0.298 m AV Peak Grad: 23.0 mmHg AV Mean Grad: 12.0 mmHg AI PHT:       181 msec  AORTA Ao Root diam: 4.50 cm  SHUNTS Systemic Diam: 3.40 cm Weston BrassGayatri Aubrianna Orchard MD Electronically signed by Weston BrassGayatri Matis Monnier MD Signature Date/Time: 08/22/2021/10:16:56 PM    Final    ? ?Cardiac Studies  ? ?Echo as above ? ?Patient Profile  ?   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 Completed antibiotics, however presents again with sepsis, concerning for recurrent infection.  ? ?Assessment & Plan  ?  ?Principal Problem: ?  SIRS (systemic inflammatory response syndrome) (HCC) ?Active Problems: ?  History of excision of pilonidal cyst ?   Hx of repair of aortic root ?  Hypokalemia ? ?Blood cultures no growth to date, however high suspicion for recurrent infection.  Previous operative management of pilonidal cyst felt to be previous

## 2021-08-23 NOTE — Anesthesia Postprocedure Evaluation (Signed)
Anesthesia Post Note ? ?Patient: RAJ LANDRESS ? ?Procedure(s) Performed: TRANSESOPHAGEAL ECHOCARDIOGRAM (TEE) ? ?  ? ?Patient location during evaluation: PACU ?Anesthesia Type: MAC ?Level of consciousness: awake and alert ?Pain management: pain level controlled ?Vital Signs Assessment: post-procedure vital signs reviewed and stable ?Respiratory status: spontaneous breathing, nonlabored ventilation and respiratory function stable ?Cardiovascular status: blood pressure returned to baseline and stable ?Postop Assessment: no apparent nausea or vomiting ?Anesthetic complications: no ? ? ?No notable events documented. ? ?Last Vitals:  ?Vitals:  ? 08/23/21 1216 08/23/21 1225  ?BP: (!) 103/53 114/60  ?Pulse: (!) 118 (!) 121  ?Resp: (!) 24 18  ?Temp: 37.3 ?C   ?SpO2: 100% 100%  ?  ?Last Pain:  ?Vitals:  ? 08/23/21 1225  ?TempSrc:   ?PainSc: 0-No pain  ? ? ?  ?  ?  ?  ?  ?  ? ?Jarome Matin Damen [place] ? ? ? ? ?

## 2021-08-23 NOTE — Progress Notes (Signed)
Day of Surgery Procedure(s) (LRB): ?TRANSESOPHAGEAL ECHOCARDIOGRAM (TEE) (N/A) ?Subjective: ?Patient examined and images of TEE personally reviewed. ?31 year old male status post homograft root replacement in February for endocarditis and a subannular abscess. 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 ? ?Patient returns now with grade fever, white count of 14,000 and mild persistent drainage from the pilonidal cyst area.  The sternal incision is well-healed.  Chest x-ray is clear.  He has been started on antibiotics. ? ?The aortic insufficiency on today's TEE appears similar to the postoperative TEE in the OR with central jet of moderate AI second to slight mismatch between the size of the homograft and the debrided annulus.  The necrotic subannular tissue was aggressively debrided and repaired with multiple pericardial pledgeted sutures. ?Since intraoperative cultures of the aortic tissue were sterile it would be unusual for the abnormalities noted on today's echo to be due to recurrent infection but more likely postoperative changes. ? ?Objective: ?Vital signs in last 24 hours: ?Temp:  [98.2 ?F (36.8 ?C)-99.5 ?F (37.5 ?C)] 98.2 ?F (36.8 ?C) (04/14 1400) ?Pulse Rate:  [89-121] 106 (04/14 1400) ?Cardiac Rhythm: Sinus tachycardia (04/14 0957) ?Resp:  [17-28] 18 (04/14 1400) ?BP: (103-160)/(53-89) 127/86 (04/14 1400) ?SpO2:  [89 %-100 %] 100 % (04/14 1400) ?Weight:  [97.4 kg] 97.4 kg (04/14 0955) ? ?Hemodynamic parameters for last 24 hours: ?  ? ?Intake/Output from previous day: ?04/13 0701 - 04/14 0700 ?In: 100 [IV Piggyback:100] ?Out: -  ?Intake/Output this shift: ?Total I/O ?In: 315.6 [I.V.:57.7; IV Piggyback:257.9] ?Out: -  ? ?Alert and comfortable ?Lungs clear ?Sinus rhythm ?Systolic flow murmur ? ?Lab Results: ?Recent Labs  ?  08/22/21 ?1740 08/23/21 ?0245  ?WBC 14.0* 11.3*  ?HGB 10.1* 9.6*  ?HCT 32.4*  30.6*  ?PLT 302 268  ? ?BMET:  ?Recent Labs  ?  08/22/21 ?1740 08/23/21 ?0245  ?NA 133* 133*  ?K 3.1* 3.5  ?CL 98 100  ?CO2 20* 24  ?GLUCOSE 102* 106*  ?BUN 8 7  ?CREATININE 0.91 0.81  ?CALCIUM 9.1 8.7*  ?  ?PT/INR:  ?Recent Labs  ?  08/22/21 ?1740  ?LABPROT 17.1*  ?INR 1.4*  ? ?ABG ?   ?Component Value Date/Time  ? PHART 7.406 07/06/2021 1626  ? HCO3 28.9 (H) 07/06/2021 1626  ? TCO2 30 07/06/2021 1626  ? ACIDBASEDEF 1.0 07/05/2021 2159  ? O2SAT 74.1 07/10/2021 0402  ? ?CBG (last 3)  ?No results for input(s): GLUCAP in the last 72 hours. ? ?Assessment/Plan: ?S/P Procedure(s) (LRB): ?TRANSESOPHAGEAL ECHOCARDIOGRAM (TEE) (N/A) ?Recommend medical treatment of moderate AI ?Follow-up cultures and treat with IV antibiotics for now. ?The patient would not be a candidate for reoperative  ?surgical therapy at this time. ? LOS: 1 day  ? ? ?Dahlia Byes ?08/23/2021 ?  ?

## 2021-08-23 NOTE — Transfer of Care (Signed)
Immediate Anesthesia Transfer of Care Note ? ?Patient: Martin Stafford ? ?Procedure(s) Performed: TRANSESOPHAGEAL ECHOCARDIOGRAM (TEE) ? ?Patient Location: PACU ? ?Anesthesia Type:MAC ? ?Level of Consciousness: drowsy ? ?Airway & Oxygen Therapy: Patient Spontanous Breathing and Patient connected to nasal cannula oxygen ? ?Post-op Assessment: Report given to RN, Post -op Vital signs reviewed and stable and Patient moving all extremities ? ?Post vital signs: Reviewed and stable ? ?Last Vitals:  ?Vitals Value Taken Time  ?BP    ?Temp    ?Pulse    ?Resp    ?SpO2    ? ? ?Last Pain:  ?Vitals:  ? 08/23/21 1115  ?TempSrc: Temporal  ?PainSc: 0-No pain  ?   ? ?  ? ?Complications: No notable events documented. ?

## 2021-08-23 NOTE — CV Procedure (Addendum)
? ? ?  PROCEDURE NOTE: ? ?Procedure:  Transesophageal echocardiogram ?Operator:  Armanda Magic, MD ?Indications:  Fever ?Complications: None ? ?During this procedure the patient is administered a total of Versed 2 mg and Propofol 337 mg to achieve and maintain sedation.  The patient's heart rate, blood pressure, and oxygen saturation are monitored continuously during the procedure by anesthesia.  ? ?Results: ?Normal LV size and function ?Normal RV size and function ?Normal RA ?Normal LA and LAA with no thrombus ?Normal TV with trivial TR ?Normal PV with trivial PR ?Normal MV with trivial MR ?S/P aortic root/ascending aorta with homograft/conduit.  There is no evidence of vegetation on the cusps of the AVR.  There is a large cavity partially filled with debris surrounding the AV annulus and best seen from 10am to 3pm in the short axis 75 degree TEE view that appears to extend into the LVOT on long axis 165 degree view and appears to extend to aortic root.  No obvious flow into this space by colorflow doppler but consistent with large annular and aortic root abscess.  There is severe AR that is centrally directed as well as eccentric. 3D images were also obtained. ?Normal interatrial septum with no evidence of shunt by colorflow dopper  ? ?The patient tolerated the procedure well and was transferred back to their room in stable condition. ? ?Recommend Cardiac/Chest CTA for further evaluation of extent of abcess. ? ?Signed: ?Armanda Magic, MD ?[facility]  ?

## 2021-08-23 NOTE — Addendum Note (Signed)
Addended by: Orlene Och on: 08/23/2021 11:12 AM ? ? Modules accepted: Orders ? ?

## 2021-08-23 NOTE — Progress Notes (Signed)
Notified Dr Salvadore Farber of pts heartrate.  Pt has been tachy, in the 120's, since returning from procedure.   ?

## 2021-08-23 NOTE — Progress Notes (Signed)
?  Echocardiogram ?Echocardiogram Transesophageal has been performed. ? ?Delcie Roch ?08/23/2021, 12:16 PM ?

## 2021-08-23 NOTE — Anesthesia Preprocedure Evaluation (Addendum)
Anesthesia Evaluation  ?Patient identified by MRN, date of birth, ID band ?Patient awake ? ? ? ?Reviewed: ?Allergy & Precautions, NPO status , Patient's Chart, lab work & pertinent test results, reviewed documented beta blocker date and time  ? ?Airway ?Mallampati: III ? ?TM Distance: >3 FB ?Neck ROM: Full ? ? ? Dental ? ?(+) Teeth Intact, Dental Advisory Given ?  ?Pulmonary ?neg pulmonary ROS,  ?  ?Pulmonary exam normal ?breath sounds clear to auscultation ? ? ? ? ? ? Cardiovascular ?hypertension (160/81 in preop, per pt normally in 140s), Pt. on medications and Pt. on home beta blockers ?Normal cardiovascular exam+ Valvular Problems/Murmurs (mod AI) AI  ?Rhythm:Regular Rate:Normal ? 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 ?Last TTE 08/22/21: ??1. Leaflets of aortic valve are not well visualized compared to prior.  ?Native non-coronary cusp appears thickened, and along the annulus the  ?post-operative material appear hypermobile. There is a centrally  ?originating jet of aortic valve regurgitation  ?with an eccentric component. AR is at least moderate, and may be  ?underestimated on this limited study due to eccentricity of jet. PHT,  ?diastolic aorta flow reversals, and apparent lack of central coaptation of  ?the valve would suggest increased degree of  ?regurgitation. Recommend TEE to evaluate further. The aortic valve has  ?been repaired/replaced. Aortic valve regurgitation is moderate. Aortic  ?valve mean gradient measures 12.0 mmHg.  ??2. Left ventricular ejection fraction, by estimation, is 55%. The left  ?ventricle has low normal function. The left ventricle has no regional wall  ?motion abnormalities.  ??3. Right ventricular systolic function is normal. The right ventricular  ?size is normal.  ??4. The mitral valve is grossly normal.  ??5. Aortic root/ascending aorta has  been repaired/replaced.  ?  ?Neuro/Psych ?negative neurological ROS ? negative psych ROS  ? GI/Hepatic ?negative GI ROS, Neg liver ROS,   ?Endo/Other  ?negative endocrine ROS ? Renal/GU ?negative Renal ROS  ?negative genitourinary ?  ?Musculoskeletal ?negative musculoskeletal ROS ?(+)  ? Abdominal ?  ?Peds ? Hematology ?negative hematology ROS ?(+)   ?Anesthesia Other Findings ? ? Reproductive/Obstetrics ?negative OB ROS ? ?  ? ? ? ? ? ? ? ? ? ? ? ? ? ?  ?  ? ? ? ? ? ? ? ?Anesthesia Physical ?Anesthesia Plan ? ?ASA: 3 ? ?Anesthesia Plan: MAC  ? ?Post-op Pain Management:   ? ?Induction:  ? ?PONV Risk Score and Plan: 2 and Propofol infusion and TIVA ? ?Airway Management Planned: Natural Airway and Simple Face Mask ? ?Additional Equipment: None ? ?Intra-op Plan:  ? ?Post-operative Plan:  ? ?Informed Consent: I have reviewed the patients History and Physical, chart, labs and discussed the procedure including the risks, benefits and alternatives for the proposed anesthesia with the patient or authorized representative who has indicated his/her understanding and acceptance.  ? ? ? ?Dental advisory given ? ?Plan Discussed with: CRNA ? ?Anesthesia Plan Comments:   ? ? ? ? ? ?Anesthesia Quick Evaluation ? ?

## 2021-08-24 ENCOUNTER — Inpatient Hospital Stay (HOSPITAL_COMMUNITY): Payer: BC Managed Care – PPO

## 2021-08-24 DIAGNOSIS — I330 Acute and subacute infective endocarditis: Secondary | ICD-10-CM

## 2021-08-24 DIAGNOSIS — R6510 Systemic inflammatory response syndrome (SIRS) of non-infectious origin without acute organ dysfunction: Secondary | ICD-10-CM | POA: Diagnosis not present

## 2021-08-24 DIAGNOSIS — I495 Sick sinus syndrome: Secondary | ICD-10-CM

## 2021-08-24 DIAGNOSIS — L0591 Pilonidal cyst without abscess: Secondary | ICD-10-CM

## 2021-08-24 LAB — CBC WITH DIFFERENTIAL/PLATELET
Abs Immature Granulocytes: 0.14 10*3/uL — ABNORMAL HIGH (ref 0.00–0.07)
Basophils Absolute: 0.1 10*3/uL (ref 0.0–0.1)
Basophils Relative: 1 %
Eosinophils Absolute: 0.1 10*3/uL (ref 0.0–0.5)
Eosinophils Relative: 1 %
HCT: 29.1 % — ABNORMAL LOW (ref 39.0–52.0)
Hemoglobin: 8.9 g/dL — ABNORMAL LOW (ref 13.0–17.0)
Immature Granulocytes: 1 %
Lymphocytes Relative: 9 %
Lymphs Abs: 1.1 10*3/uL (ref 0.7–4.0)
MCH: 23.2 pg — ABNORMAL LOW (ref 26.0–34.0)
MCHC: 30.6 g/dL (ref 30.0–36.0)
MCV: 76 fL — ABNORMAL LOW (ref 80.0–100.0)
Monocytes Absolute: 1.1 10*3/uL — ABNORMAL HIGH (ref 0.1–1.0)
Monocytes Relative: 9 %
Neutro Abs: 9.7 10*3/uL — ABNORMAL HIGH (ref 1.7–7.7)
Neutrophils Relative %: 79 %
Platelets: 249 10*3/uL (ref 150–400)
RBC: 3.83 MIL/uL — ABNORMAL LOW (ref 4.22–5.81)
RDW: 14.9 % (ref 11.5–15.5)
WBC: 12.2 10*3/uL — ABNORMAL HIGH (ref 4.0–10.5)
nRBC: 0 % (ref 0.0–0.2)

## 2021-08-24 LAB — COMPREHENSIVE METABOLIC PANEL
ALT: 12 U/L (ref 0–44)
AST: 11 U/L — ABNORMAL LOW (ref 15–41)
Albumin: 2.7 g/dL — ABNORMAL LOW (ref 3.5–5.0)
Alkaline Phosphatase: 53 U/L (ref 38–126)
Anion gap: 10 (ref 5–15)
BUN: <5 mg/dL — ABNORMAL LOW (ref 6–20)
CO2: 23 mmol/L (ref 22–32)
Calcium: 8.6 mg/dL — ABNORMAL LOW (ref 8.9–10.3)
Chloride: 102 mmol/L (ref 98–111)
Creatinine, Ser: 0.78 mg/dL (ref 0.61–1.24)
GFR, Estimated: >60 mL/min (ref >60)
Glucose, Bld: 95 mg/dL (ref 70–99)
Potassium: 3.5 mmol/L (ref 3.5–5.1)
Sodium: 135 mmol/L (ref 135–145)
Total Bilirubin: 1.3 mg/dL — ABNORMAL HIGH (ref 0.3–1.2)
Total Protein: 6.8 g/dL (ref 6.5–8.1)

## 2021-08-24 LAB — URINALYSIS, ROUTINE W REFLEX MICROSCOPIC
Bilirubin Urine: NEGATIVE
Glucose, UA: NEGATIVE mg/dL
Hgb urine dipstick: NEGATIVE
Ketones, ur: 80 mg/dL — AB
Leukocytes,Ua: NEGATIVE
Nitrite: NEGATIVE
Protein, ur: NEGATIVE mg/dL
Specific Gravity, Urine: 1.028 (ref 1.005–1.030)
pH: 5 (ref 5.0–8.0)

## 2021-08-24 LAB — C-REACTIVE PROTEIN: CRP: 24.1 mg/dL — ABNORMAL HIGH (ref <1.0)

## 2021-08-24 LAB — SEDIMENTATION RATE: Sed Rate: 51 mm/hr — ABNORMAL HIGH (ref 0–16)

## 2021-08-24 MED ORDER — METOCLOPRAMIDE HCL 5 MG PO TABS
5.0000 mg | ORAL_TABLET | Freq: Three times a day (TID) | ORAL | Status: DC
  Administered 2021-08-24 – 2021-08-27 (×8): 5 mg via ORAL
  Filled 2021-08-24 (×9): qty 1

## 2021-08-24 MED ORDER — PHENOL 1.4 % MT LIQD
1.0000 | OROMUCOSAL | Status: DC | PRN
  Administered 2021-08-24: 1 via OROMUCOSAL
  Filled 2021-08-24: qty 177

## 2021-08-24 MED ORDER — IOHEXOL 300 MG/ML  SOLN
100.0000 mL | Freq: Once | INTRAMUSCULAR | Status: AC | PRN
  Administered 2021-08-24: 100 mL via INTRAVENOUS

## 2021-08-24 MED ORDER — SODIUM CHLORIDE 0.9 % IV SOLN
2.0000 g | INTRAVENOUS | Status: DC
  Administered 2021-08-24 – 2021-08-26 (×3): 2 g via INTRAVENOUS
  Filled 2021-08-24 (×3): qty 20

## 2021-08-24 NOTE — Progress Notes (Signed)
?PROGRESS NOTE ? ? 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 Madlyn Frankel on daptomycin and ceftriaxone and completed the same 08/13/2021 under guidance of ID ?Follow-up TTE 08/19/2021 = hypokinesis AV regurg the source was felt to be a pilonidal cyst ? ?Developed chills malaise chest discomfort nausea vomiting since 4/9 sternotomy site has healed no erythema ?Evaluated in cardiology clinic 4/13 and concerns for worsening aortic insufficiency sepsis criteria met on admission with tachycardia chest pain and was febrile and sent to the emergency room ?Found to be in sinus tach at 123 WBC 14 platelet 302 INR 1.4 Tmax 101.2 ? ?Cardiology evaluated did stat TTE ?TEE performed 4/14 confirms abnormalities including large cavity with debris + severe AR +?  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 Darcey Nora and there is no current surgical role for management as the risks outweigh the benefits of the same at this time ? ?ID input is pending ? ?Hospital-Problem based course ? 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 Darcey Nora ?As per CVTS no current role for surgery-await infectious disease input ?Preliminary blood cultures X2 on 4/13 = NGTD ?NS 100 cc/H ?Prior pilonidal cyst status post resection 07/03/2021 Dr. Coralie Keens ?Patient has been having continued drainage and was seen about a week and a half ago and general surgery office-area was probed-he still has mild drainage but it seems to be improving ?For completion sake obtain CT abdomen pelvis to rule out underlying collection ?Current antibiotics include cefepime and vancomycin-Flagyl was added because of Tmax 102.2 on 4/14 p.m. ?Sinus tachycardia ?Secondary to infectious etiology probably in addition to altered  dynamics with regards to aortic valve abscess?  Other-increase fluids to 150 cc/H ?Reassess ??  Addition beta-blocker-defer this to cardiology ?Hypokalemia on admission ?Replaced orally now normal ?Likely anemia chronic disease ?MCV is low hemoglobin is 9.6-monitor trends ? ? ?DVT prophylaxis: Heparin full ?Code Status: Full ?Family Communication: No family present at this time ?Disposition:  ? ?Patient is not ready for discharge-he remains ill  ? ? ?Status is: Inpatient ?Remains inpatient appropriate because: High risk and will probably need further surgery versus prolonged antibiotics dependent on consultant input ?  ?Consultants:  ?Cardiology Dr. Margaretann Loveless ? cardiothoracic surgery Dr. Darcey Nora ? ?Procedures: TTE 4/13 ?TEE 4/14 pending ? ?Antimicrobials:  ? ?Currently vancomycin and cefepime  ? ? ?Subjective: ? ?Anorexia-nausea-inability to eat mainly having liquids-he does not feel well-his improvement from admission is minimal ?Several bouts of loose watery stool 24 to 48 hours prior to admission-none now ?Noted events overnight Tmax >102 blood cultures not obtained at the time ? ? ?Objective: ?Vitals:  ? 08/24/21 0136 08/24/21 0314 08/24/21 0505 08/24/21 0835  ?BP: 130/79 (!) 144/84 126/77 134/78  ?Pulse: (!) 101 (!) 118 (!) 114 92  ?Resp: (!) 25 19 19 17   ?Temp: 98.6 ?F (37 ?C) (!) 101 ?F (38.3 ?C) 99.3 ?F (37.4 ?C) 99.3 ?F (37.4 ?C)  ?TempSrc: Oral Oral Oral Oral  ?SpO2: 96% 98% 93% 99%  ?Weight:      ?Height:      ? ? ?Intake/Output Summary (Last 24 hours) at 08/24/2021 1028 ?Last data filed at 08/24/2021 0355 ?Gross per 24 hour  ?Intake 1666.15 ml  ?Output 650 ml  ?Net 1016.15 ml  ? ? ?Filed Weights  ? 08/22/21 1722 08/23/21 0955  ?Weight: 101.2 kg 97.4 kg  ? ? ?Examination: ? ?  Alert coherent however tired appearing black male looking older than stated age ?No icterus no pallor ?Neck soft supple ?K2-H0 holosystolic murmur ?Abdomen is soft no rebound ?Rectal exam he has a 4 x 1 cm linear abscess in the natal  cleft I did not probe or examine further ?No lower extremity edema ?Neurologically intact moving all 4 limbs equally without focal deficit ? ?Data Reviewed: personally reviewed  ? ?CBC ?   ?Component Value Date/Time  ? WBC 12.2 (H) 08/24/2021 0154  ? RBC 3.83 (L) 08/24/2021 0154  ? HGB 8.9 (L) 08/24/2021 0154  ? HCT 29.1 (L) 08/24/2021 0154  ? PLT 249 08/24/2021 0154  ? MCV 76.0 (L) 08/24/2021 0154  ? MCH 23.2 (L) 08/24/2021 0154  ? MCHC 30.6 08/24/2021 0154  ? RDW 14.9 08/24/2021 0154  ? LYMPHSABS 1.1 08/24/2021 0154  ? MONOABS 1.1 (H) 08/24/2021 0154  ? EOSABS 0.1 08/24/2021 0154  ? BASOSABS 0.1 08/24/2021 0154  ? ? ?  Latest Ref Rng & Units 08/24/2021  ?  1:54 AM 08/23/2021  ?  2:45 AM 08/22/2021  ?  5:40 PM  ?CMP  ?Glucose 70 - 99 mg/dL 95   106   102    ?BUN 6 - 20 mg/dL <5   7   8     ?Creatinine 0.61 - 1.24 mg/dL 0.78   0.81   0.91    ?Sodium 135 - 145 mmol/L 135   133   133    ?Potassium 3.5 - 5.1 mmol/L 3.5   3.5   3.1    ?Chloride 98 - 111 mmol/L 102   100   98    ?CO2 22 - 32 mmol/L 23   24   20     ?Calcium 8.9 - 10.3 mg/dL 8.6   8.7   9.1    ?Total Protein 6.5 - 8.1 g/dL 6.8    8.3    ?Total Bilirubin 0.3 - 1.2 mg/dL 1.3    1.2    ?Alkaline Phos 38 - 126 U/L 53    64    ?AST 15 - 41 U/L 11    11    ?ALT 0 - 44 U/L 12    16    ? ? ? ?Radiology Studies: ?DG Chest 2 View ? ?Result Date: 08/22/2021 ?CLINICAL DATA:  Suspected sepsis EXAM: CHEST - 2 VIEW COMPARISON:  07/24/2021, 07/12/2021 FINDINGS: Mild left lower lobe airspace disease with partial clearing since the prior studies. No significant pleural effusion. Right lung clear Median sternotomy.  Heart size upper normal.  Vascularity normal. IMPRESSION: Slowly improving left lower lobe infiltrate likely due to resolving pneumonia. No new finding. Electronically Signed   By: Franchot Gallo M.D.   On: 08/22/2021 18:22  ? ?ECHO TEE ? ?Result Date: 08/23/2021 ?   TRANSESOPHOGEAL ECHO REPORT   Patient Name:   Martin Stafford Date of Exam: 08/23/2021 Medical Rec #:   [id number]         Height:       71.0 in Accession #:    [id number]        Weight:       214.7 lb Date of Birth:  [birth year]         BSA:          2.173 m? Patient Age:    31 years          BP:           129/81 mmHg Patient Gender: M  HR:           125 bpm. Exam Location:  Inpatient Procedure: Transesophageal Echo, 3D Echo, Color Doppler and Cardiac Doppler Indications:     aortic root abscess  History:         Patient has prior history of Echocardiogram examinations, most                  recent 08/22/2021. Aortic root repair.  Sonographer:     Johny Chess RDCS Referring Phys:  [id number] Elouise Munroe Diagnosing Phys: Fransico Him MD PROCEDURE: After discussion of the risks and benefits of a TEE, an informed consent was obtained from the patient. The transesophogeal probe was passed without difficulty through the esophogus of the patient. Imaged were obtained with the patient in a left lateral decubitus position. Sedation performed by different physician. The patient was monitored while under deep sedation. Anesthestetic sedation was provided intravenously by Anesthesiology: 341m of Propofol. The patient's vital signs; including heart rate, blood pressure, and oxygen saturation; remained stable throughout the procedure. The patient developed no complications during the procedure. IMPRESSIONS  1. Left ventricular ejection fraction, by estimation, is 60 to 65%. The left ventricle has normal function. The left ventricle has no regional wall motion abnormalities.  2. Right ventricular systolic function is normal. The right ventricular size is normal.  3. No left atrial/left atrial appendage thrombus was detected.  4. The mitral valve is normal in structure. Trivial mitral valve regurgitation. No evidence of mitral stenosis.  5. S/P aortic root/ascending aorta repair with aortic conduit and AV homograft. There is no evidence of vegetation on the cups of the AVR. There is a large cavity partially  filled with debris surrounding the AV annulus and best seen from 10am to 3pm in the short axis 75 degree view that appears to extend into the LVOT on long axis 165 degree view and appears to extend to the [employer]

## 2021-08-24 NOTE — Consult Note (Addendum)
?   ? ? ? ? ?[facility]   ? ?Date of Admission:  08/22/2021   Total days of inpatient antibiotics 2 ? ?      ?Reason for Consult: Aortic root abscess    ?Principal Problem: ?  SIRS (systemic inflammatory response syndrome) (HCC) ?Active Problems: ?  History of excision of pilonidal cyst ?  Hx of repair of aortic root ?  Hypokalemia ?  Infective endocarditis ? ? ?Assessment: ?66 YM with presented with N/V and fever  admitted with arotic root abscess. Wbc 14k on admission with temp of 102.1 during hospitalization. ? ?#Fever/Leukocytosis suspected 2/2 aortic root abscess ?#Aortic root abscess SP AVR on 2/24 with Hx of aortic root abscess on TEE on 2/21 ?#Cx negative endocarditis SP daptomycin and CTX x 6 weeks (EOT 08/15/21) ?#Pilonidal cyst and hydranitis SP excision on 2/22 ?-Found to have aortic  root abscess on 2/21 TEE ?-Underwent AVR on 2/24: Cx negative. UW PCR was sent but unable to run. Followed by ID(Dr. Luciana Axe) pt complete dapto+ ctx on 4/6(6 weeks) ?-TTE on 4/10 showed EF mildly reduced and LV dilated compared to previous TEE.  ?-He developed  chills , chest discomfort on 4/9 followed by diarrhea(PICC line pulled 4/10). Seen by Cards on 4/13 and sent to ED for evaluation due ro concern for sepsis and recurrent endocarditis ?-TEE  on admission showed large cvity partially filled with debris surrounding AV annulus extending into aortic root c/w large annular aortic root abscess ?-CTS consulted and pt is not a canddiate for reoperative surgical therapay at this time. Noted abnormality seen on echo more likely due to post surgical changes as intial OR cx were sterile.  ?Pt continues to fever on broad spectrum antibiotics. Tmax of 101.3 today. His initial diarrhea has resolved. I think CT would be helpful to assess if there are other foci of infection. If CT is negative likely source of fever/leukocytosis is aortic root abscess. On my exam the site of pilonidal cyst did not appear  acutely infected today.  ?Recommendations:  ?-Continue vanomycin ?-D/C cefepime  ?-Start ceftriaxone.  ?-Follow blood Cx ?-Agree with CT AP. Will continue metronidazole till imaging results ?Microbiology:   ?Antibiotics: ?Daptomycin and ctx complete 4/6 ? Vancomcyin cefepime and metronidazole 4/13-p ?Cultures: ?Blood ?4/13 NG ? ? ? ?HPI: Martin Stafford is a 31 y.o. male with Cx negative IE of native aortic valve root/LVOT abscess SP AVR on 2/24 and daptomycin + ceftriaxone x 6 weeks EOT 4/6, recurrent pilonidal syst SP surgical debridement on 2/20 admitted with concern for infection He devloped chill nausea, vomiting on 4/9. Seen in cards clinic on 41/3 and sent for admission. TTE on 4/10 showed mild to moderate AV regurgitation.  ?TEE showed large cvity partially filled with debris surrounding AV annulus extending into aortic root c/w large annular aortic root abscess.  ?Today, pt is sitting in chair. Mother at bedside. He reports symptoms of chills/N/V started Sunday night followed by diarrhea. He reports diarrhea have now resolved. PICC line was pulled the next day, did not note any issue issue with line site.  ?Review of Systems: ?Review of Systems  ?All other systems reviewed and are negative. ? ?Past Medical History:  ?Diagnosis Date  ? Environmental allergies   ? Generalized skin cysts   ? ? ?Social History  ? ?Tobacco Use  ? Smoking status: Never  ? Smokeless tobacco: Never  ?Vaping Use  ? Vaping Use: Never used  ?Substance Use Topics  ?  Alcohol use: No  ? Drug use: No  ? ? ?Family History  ?Problem Relation Age of Onset  ? Hypertension Father   ? Hypertension Other   ? Diabetes Other   ? Asthma Mother   ? ?Scheduled Meds: ? aspirin EC  81 mg Oral Daily  ? heparin  5,000 Units Subcutaneous Q8H  ? metoCLOPramide  5 mg Oral TID AC  ? sodium chloride flush  3 mL Intravenous Q12H  ? ?Continuous Infusions: ? ceFEPime (MAXIPIME) IV 2 g (08/24/21 0515)  ? lactated ringers 100 mL/hr at 08/24/21 2878  ?  metronidazole 500 mg (08/24/21 1013)  ? vancomycin 1,500 mg (08/23/21 2232)  ? ?PRN Meds:.acetaminophen **OR** acetaminophen, melatonin, ondansetron **OR** ondansetron (ZOFRAN) IV ?Allergies  ?Allergen Reactions  ? Eggs Or Egg-Derived Products   ?  "eggs"  ? Peanut-Containing Drug Products   ?  "peanuts"  ? ? ?OBJECTIVE: ?Blood pressure 134/78, pulse 92, temperature 99.3 ?F (37.4 ?C), temperature source Oral, resp. rate 17, height 5\' 11"  (1.803 m), weight 97.4 kg, SpO2 99 %. ? ?Physical Exam ?Constitutional:   ?   General: He is not in acute distress. ?   Appearance: He is normal weight. He is not toxic-appearing.  ?HENT:  ?   Head: Normocephalic and atraumatic.  ?   Right Ear: External ear normal.  ?   Left Ear: External ear normal.  ?   Nose: No congestion or rhinorrhea.  ?   Mouth/Throat:  ?   Mouth: Mucous membranes are moist.  ?   Pharynx: Oropharynx is clear.  ?Eyes:  ?   Extraocular Movements: Extraocular movements intact.  ?   Conjunctiva/sclera: Conjunctivae normal.  ?   Pupils: Pupils are equal, round, and reactive to light.  ?Cardiovascular:  ?   Rate and Rhythm: Normal rate and regular rhythm.  ?   Heart sounds: No murmur heard. ?  No friction rub. No gallop.  ?Pulmonary:  ?   Effort: Pulmonary effort is normal.  ?   Breath sounds: Normal breath sounds.  ?Abdominal:  ?   General: Abdomen is flat. Bowel sounds are normal.  ?   Palpations: Abdomen is soft.  ?Genitourinary: ?   Comments: Pinoidal cyst wihtout drainage or erythema ?Musculoskeletal:     ?   General: No swelling. Normal range of motion.  ?   Cervical back: Normal range of motion and neck supple.  ?Skin: ?   General: Skin is warm and dry.  ?Neurological:  ?   General: No focal deficit present.  ?   Mental Status: He is oriented to person, place, and time.  ?Psychiatric:     ?   Mood and Affect: Mood normal.  ? ? ?Lab Results ?Lab Results  ?Component Value Date  ? WBC 12.2 (H) 08/24/2021  ? HGB 8.9 (L) 08/24/2021  ? HCT 29.1 (L) 08/24/2021  ?  MCV 76.0 (L) 08/24/2021  ? PLT 249 08/24/2021  ?  ?Lab Results  ?Component Value Date  ? CREATININE 0.78 08/24/2021  ? BUN <5 (L) 08/24/2021  ? NA 135 08/24/2021  ? K 3.5 08/24/2021  ? CL 102 08/24/2021  ? CO2 23 08/24/2021  ?  ?Lab Results  ?Component Value Date  ? ALT 12 08/24/2021  ? AST 11 (L) 08/24/2021  ? ALKPHOS 53 08/24/2021  ? BILITOT 1.3 (H) 08/24/2021  ?  ? ? ? ?08/26/2021, MD ?[facility] ?[facility] ?08/24/2021, 11:11 AM  ?

## 2021-08-24 NOTE — Progress Notes (Signed)
Oral contrast x 2 given to pt with instructions to drink each bottle over an hour starting at 1:30 pm with the first bottle. Verbalized understanding. Wife into room at bedside.   ?

## 2021-08-24 NOTE — Progress Notes (Signed)
Oral temp = 102.9. Dr Mahala Menghini notified. Orders received for Bld Cx X 2. Will give APAP after Bld Cx drawn.  ?

## 2021-08-24 NOTE — Progress Notes (Signed)
? ?Progress Note ? ?Patient Name: Martin Stafford ?Date of Encounter: 08/24/2021 ? ?[facility] Cardiologist: Thurmon Fair, MD  ? ?Subjective  ? ?Chills overnight, subjective shaking. Has chest pain again today.  ? ?Inpatient Medications  ?  ?Scheduled Meds: ? aspirin EC  81 mg Oral Daily  ? heparin  5,000 Units Subcutaneous Q8H  ? metoCLOPramide  5 mg Oral TID AC  ? sodium chloride flush  3 mL Intravenous Q12H  ? ?Continuous Infusions: ? ceFEPime (MAXIPIME) IV 2 g (08/24/21 0515)  ? lactated ringers 150 mL/hr at 08/24/21 1138  ? metronidazole 500 mg (08/24/21 1013)  ? vancomycin 1,500 mg (08/24/21 1139)  ? ?PRN Meds: ?acetaminophen **OR** acetaminophen, melatonin, ondansetron **OR** ondansetron (ZOFRAN) IV  ? ?Vital Signs  ?  ?Vitals:  ? 08/24/21 0314 08/24/21 0505 08/24/21 0835 08/24/21 1148  ?BP: (!) 144/84 126/77 134/78 (!) 146/85  ?Pulse: (!) 118 (!) 114 92 (!) 119  ?Resp: 19 19 17 18   ?Temp: (!) 101 ?F (38.3 ?C) 99.3 ?F (37.4 ?C) 99.3 ?F (37.4 ?C) 99.7 ?F (37.6 ?C)  ?TempSrc: Oral Oral Oral Oral  ?SpO2: 98% 93% 99% 98%  ?Weight:      ?Height:      ? ? ?Intake/Output Summary (Last 24 hours) at 08/24/2021 1204 ?Last data filed at 08/24/2021 08/26/2021 ?Gross per 24 hour  ?Intake 1666.15 ml  ?Output 650 ml  ?Net 1016.15 ml  ? ? ?  08/23/2021  ?  9:55 AM 08/22/2021  ?  5:22 PM 08/01/2021  ?  9:51 AM  ?Last 3 Weights  ?Weight (lbs) 214 lb 11.2 oz 223 lb 1.7 oz 223 lb  ?Weight (kg) 97.387 kg 101.2 kg 101.152 kg  ?   ? ?Telemetry  ?  ?Sinus tachycardia - Personally Reviewed ? ?ECG  ?  ?pending - Personally Reviewed ? ?Physical Exam  ? ?GEN: No acute distress.   ?Neck: No JVD ?Cardiac: tachycardic. 2/6 HDM  ?Respiratory: Clear ?GI: Soft, nontender, non-distended  ?MS: trace edema; No deformity. ?Neuro:  Nonfocal  ?Psych: Normal affect  ? ?Labs  ?  ?High Sensitivity Troponin:   ?Recent Labs  ?Lab 08/22/21 ?1740 08/22/21 ?2006  ?TROPONINIHS 45* 57*  ?   ?Chemistry ?Recent Labs  ?Lab 08/22/21 ?1740 08/22/21 ?2006  08/23/21 ?08/25/21 08/24/21 ?0154  ?NA 133*  --  133* 135  ?K 3.1*  --  3.5 3.5  ?CL 98  --  100 102  ?CO2 20*  --  24 23  ?GLUCOSE 102*  --  106* 95  ?BUN 8  --  7 <5*  ?CREATININE 0.91  --  0.81 0.78  ?CALCIUM 9.1  --  8.7* 8.6*  ?MG  --  1.8  --   --   ?PROT 8.3*  --   --  6.8  ?ALBUMIN 3.2*  --   --  2.7*  ?AST 11*  --   --  11*  ?ALT 16  --   --  12  ?ALKPHOS 64  --   --  53  ?BILITOT 1.2  --   --  1.3*  ?GFRNONAA >60  --  >60 >60  ?ANIONGAP 15  --  9 10  ?  ?Lipids No results for input(s): CHOL, TRIG, HDL, LABVLDL, LDLCALC, CHOLHDL in the last 168 hours.  ?Hematology ?Recent Labs  ?Lab 08/22/21 ?1740 08/23/21 ?08/25/21 08/24/21 ?0154  ?WBC 14.0* 11.3* 12.2*  ?RBC 4.29 4.04* 3.83*  ?HGB 10.1* 9.6* 8.9*  ?HCT 32.4* 30.6* 29.1*  ?MCV 75.5*  75.7* 76.0*  ?MCH 23.5* 23.8* 23.2*  ?MCHC 31.2 31.4 30.6  ?RDW 15.0 14.8 14.9  ?PLT 302 268 249  ? ?Thyroid No results for input(s): TSH, FREET4 in the last 168 hours.  ?BNPNo results for input(s): BNP, PROBNP in the last 168 hours.  ?DDimer No results for input(s): DDIMER in the last 168 hours.  ? ?Radiology  ?  ?DG Chest 2 View ? ?Result Date: 08/22/2021 ?CLINICAL DATA:  Suspected sepsis EXAM: CHEST - 2 VIEW COMPARISON:  07/24/2021, 07/12/2021 FINDINGS: Mild left lower lobe airspace disease with partial clearing since the prior studies. No significant pleural effusion. Right lung clear Median sternotomy.  Heart size upper normal.  Vascularity normal. IMPRESSION: Slowly improving left lower lobe infiltrate likely due to resolving pneumonia. No new finding. Electronically Signed   By: Marlan Palauharles  Clark M.D.   On: 08/22/2021 18:22  ? ?ECHO TEE ? ?Result Date: 08/23/2021 ?   TRANSESOPHOGEAL ECHO REPORT   Patient Name:   Martin Stafford Date of Exam: 08/23/2021 Medical Rec #:  [id number]         Height:       71.0 in Accession #:    [id number]        Weight:       214.7 lb Date of Birth:  04/20/1991         BSA:          2.173 m? Patient Age:    31 years          BP:           129/81 mmHg  Patient Gender: M                 HR:           125 bpm. Exam Location:  Inpatient Procedure: Transesophageal Echo, 3D Echo, Color Doppler and Cardiac Doppler Indications:     aortic root abscess  History:         Patient has prior history of Echocardiogram examinations, most                  recent 08/22/2021. Aortic root repair.  Sonographer:     Delcie RochLauren Pennington RDCS Referring Phys:  [id number] Parke PoissonGAYATRI A Matvey Llanas Diagnosing Phys: Armanda Magicraci Turner MD PROCEDURE: After discussion of the risks and benefits of a TEE, an informed consent was obtained from the patient. The transesophogeal probe was passed without difficulty through the esophogus of the patient. Imaged were obtained with the patient in a left lateral decubitus position. Sedation performed by different physician. The patient was monitored while under deep sedation. Anesthestetic sedation was provided intravenously by Anesthesiology: 337mg  of Propofol. The patient's vital signs; including heart rate, blood pressure, and oxygen saturation; remained stable throughout the procedure. The patient developed no complications during the procedure. IMPRESSIONS  1. Left ventricular ejection fraction, by estimation, is 60 to 65%. The left ventricle has normal function. The left ventricle has no regional wall motion abnormalities.  2. Right ventricular systolic function is normal. The right ventricular size is normal.  3. No left atrial/left atrial appendage thrombus was detected.  4. The mitral valve is normal in structure. Trivial mitral valve regurgitation. No evidence of mitral stenosis.  5. S/P aortic root/ascending aorta repair with aortic conduit and AV homograft. There is no evidence of vegetation on the cups of the AVR. There is a large cavity partially filled with debris surrounding the AV annulus and best seen from 10am to 3pm in the short axis  75 degree view that appears to extend into the LVOT on long axis 165 degree view and appears to extend to the aortic root.  There is no obvious flow into this space by colorflow doppler but is consistent with large annular and aortic root abscess. There is severe AR that is centrally directed as well as eccentric. 3D images were obtained. . The aortic valve has been repaired/replaced. Aortic valve regurgitation is severe.  6. Aortic root/ascending aorta has been repaired/replaced.  7. The inferior vena cava is normal in size with greater than 50% respiratory variability, suggesting right atrial pressure of 3 mmHg. Conclusion(s)/Recommendation(s): Findings are concerning for vegetation/infective endocarditis as detailed above. FINDINGS  Left Ventricle: Left ventricular ejection fraction, by estimation, is 60 to 65%. The left ventricle has normal function. The left ventricle has no regional wall motion abnormalities. The left ventricular internal cavity size was normal in size. There is  no left ventricular hypertrophy. Right Ventricle: The right ventricular size is normal. No increase in right ventricular wall thickness. Right ventricular systolic function is normal. Left Atrium: Left atrial size was normal in size. No left atrial/left atrial appendage thrombus was detected. Right Atrium: Right atrial size was normal in size. Pericardium: There is no evidence of pericardial effusion. Mitral Valve: The mitral valve is normal in structure. Trivial mitral valve regurgitation. No evidence of mitral valve stenosis. Tricuspid Valve: The tricuspid valve is normal in structure. Tricuspid valve regurgitation is trivial. No evidence of tricuspid stenosis. Aortic Valve: S/P aortic root/ascending aorta repair with aortic conduit and AV homograft. There is no evidence of vegetation on the cups of the AVR. There is a large cavity partially filled with debris surrounding the AV annulus and best seen from 10am to 3pm in the short axis 75 degree view that appears to extend into the LVOT on long axis 165 degree view and appears to extend to the aortic  root. There is no obvious flow into this space by colorflow doppler but is consistent with large annular and aortic root abscess. There is severe AR that is centrally directed as well as eccentric. 3D images were obtai

## 2021-08-25 ENCOUNTER — Inpatient Hospital Stay (HOSPITAL_COMMUNITY): Payer: BC Managed Care – PPO

## 2021-08-25 DIAGNOSIS — R6510 Systemic inflammatory response syndrome (SIRS) of non-infectious origin without acute organ dysfunction: Secondary | ICD-10-CM | POA: Diagnosis not present

## 2021-08-25 DIAGNOSIS — E876 Hypokalemia: Secondary | ICD-10-CM

## 2021-08-25 LAB — COMPREHENSIVE METABOLIC PANEL
ALT: 10 U/L (ref 0–44)
AST: 10 U/L — ABNORMAL LOW (ref 15–41)
Albumin: 2.5 g/dL — ABNORMAL LOW (ref 3.5–5.0)
Alkaline Phosphatase: 50 U/L (ref 38–126)
Anion gap: 12 (ref 5–15)
BUN: <5 mg/dL — ABNORMAL LOW (ref 6–20)
CO2: 23 mmol/L (ref 22–32)
Calcium: 8.3 mg/dL — ABNORMAL LOW (ref 8.9–10.3)
Chloride: 96 mmol/L — ABNORMAL LOW (ref 98–111)
Creatinine, Ser: 0.81 mg/dL (ref 0.61–1.24)
GFR, Estimated: >60 mL/min (ref >60)
Glucose, Bld: 102 mg/dL — ABNORMAL HIGH (ref 70–99)
Potassium: 3.3 mmol/L — ABNORMAL LOW (ref 3.5–5.1)
Sodium: 131 mmol/L — ABNORMAL LOW (ref 135–145)
Total Bilirubin: 0.8 mg/dL (ref 0.3–1.2)
Total Protein: 6.7 g/dL (ref 6.5–8.1)

## 2021-08-25 LAB — VANCOMYCIN, TROUGH: Vancomycin Tr: 5 ug/mL — ABNORMAL LOW (ref 15–20)

## 2021-08-25 LAB — CBC
HCT: 27.6 % — ABNORMAL LOW (ref 39.0–52.0)
Hemoglobin: 8.8 g/dL — ABNORMAL LOW (ref 13.0–17.0)
MCH: 23.8 pg — ABNORMAL LOW (ref 26.0–34.0)
MCHC: 31.9 g/dL (ref 30.0–36.0)
MCV: 74.8 fL — ABNORMAL LOW (ref 80.0–100.0)
Platelets: 231 10*3/uL (ref 150–400)
RBC: 3.69 MIL/uL — ABNORMAL LOW (ref 4.22–5.81)
RDW: 14.9 % (ref 11.5–15.5)
WBC: 11.9 10*3/uL — ABNORMAL HIGH (ref 4.0–10.5)
nRBC: 0 % (ref 0.0–0.2)

## 2021-08-25 LAB — VANCOMYCIN, PEAK
Vancomycin Pk: 4 ug/mL — ABNORMAL LOW (ref 30–40)
Vancomycin Pk: 25 ug/mL — ABNORMAL LOW (ref 30–40)

## 2021-08-25 MED ORDER — POTASSIUM CHLORIDE CRYS ER 10 MEQ PO TBCR
40.0000 meq | EXTENDED_RELEASE_TABLET | Freq: Every day | ORAL | Status: DC
  Administered 2021-08-25: 40 meq via ORAL
  Filled 2021-08-25 (×2): qty 4
  Filled 2021-08-25: qty 2

## 2021-08-25 MED ORDER — SODIUM CHLORIDE 0.9 % IV SOLN: INTRAVENOUS | Status: DC | PRN

## 2021-08-25 MED ORDER — VANCOMYCIN HCL 1250 MG/250ML IV SOLN
1250.0000 mg | Freq: Three times a day (TID) | INTRAVENOUS | Status: DC
  Administered 2021-08-25 – 2021-08-26 (×3): 1250 mg via INTRAVENOUS
  Filled 2021-08-25 (×6): qty 250

## 2021-08-25 MED ORDER — IOHEXOL 300 MG/ML  SOLN
100.0000 mL | Freq: Once | INTRAMUSCULAR | Status: AC | PRN
  Administered 2021-08-25: 100 mL via INTRAVENOUS

## 2021-08-25 NOTE — Progress Notes (Signed)
Pharmacy Antibiotic Note ? ?Martin Stafford is a 31 y.o. male admitted on 08/22/2021 with  culture negative endocarditis . 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 ID has been consulted and recommending to continue IV Vancomycin, ceftriaxone and metronidazole. Pharmacy has been consulted for vancomycin dosing.  ? ?Vancomycin peak 25 mg/dL and vancomycin trough 5 mg/dL with a calculated AUC of 349 and below goal AUC at steady state. Patient currently febrile with temp of 101.53F, WBC is mildly elevated at 11.9 today. Renal function continues to be at baseline.  ? ? ?Plan: ?Ceftriaxone per MD ?Metronidazole per MD ?Vancomycin 1250 mg q8h (calculated AUC 432) ?F/u clinical course, fever curve, cultures ?Monitor renal function closely with q8h dosing ?Vancomycin levels at steady state as needed ? ?Height: 5\' 11"  (180.3 cm) ?Weight: 97.4 kg (214 lb 11.2 oz) ?IBW/kg (Calculated) : 75.3 ? ?Temp (24hrs), Avg:100.5 ?F (38.1 ?C), Min:98.8 ?F (37.1 ?C), Max:102.4 ?F (39.1 ?C) ? ?Recent Labs  ?Lab 08/22/21 ?1740 08/22/21 ?2006 08/23/21 ?0245 08/24/21 ?0154 08/25/21 ?0109 08/25/21 ?0518 08/25/21 ?1206  ?WBC 14.0*  --  11.3* 12.2* 11.9*  --   --   ?CREATININE 0.91  --  0.81 0.78 0.81  --   --   ?LATICACIDVEN 0.9 0.9  --   --   --   --   --   ?VANCOTROUGH  --   --   --   --   --   --  5*  ?VANCOPEAK  --   --   --   --  4* 25*  --   ? ?  ?Estimated Creatinine Clearance: 160.1 mL/min (by C-G formula based on SCr of 0.81 mg/dL).   ? ?Allergies  ?Allergen Reactions  ? Eggs Or Egg-Derived Products   ?  "eggs"  ? Peanut-Containing Drug Products   ?  "peanuts"  ? ? ?Antimicrobials this admission: ?Cefepime 4/13 >> 4/15 ?Vancomycin 4/13 >>  ?Ceftriaxone 4/15 >> ?Metronidazole 4/14 >> ? ?Dose adjustments this admission: ?Vancomycin 1500 mg q12h > Vancomycin 1250 mg q8h ? ?Microbiology results: ?4/15 Bcx pending ?4/13 BCx: ngtd ? ?Vancomycin Levels: ?4/16 VP 25, VT 5, calculated AUC  349 ? ?Thank you for involving pharmacy in this patient's care. ? ?5/16, PharmD ?PGY1 Ambulatory Care Pharmacy Resident ?08/25/2021 1:33 PM ? ?**Pharmacist phone directory can be found on [email or web address] listed under [facility]** ?

## 2021-08-25 NOTE — Progress Notes (Signed)
? ?Progress Note ? ?Patient Name: Martin Stafford ?Date of Encounter: 08/25/2021 ? ?[facility] Cardiologist: Sanda Klein, MD  ? ?Subjective  ? ?Still not feeling well, chills, nausea, fever.  ? ?Inpatient Medications  ?  ?Scheduled Meds: ? aspirin EC  81 mg Oral Daily  ? heparin  5,000 Units Subcutaneous Q8H  ? metoCLOPramide  5 mg Oral TID AC  ? potassium chloride  40 mEq Oral Daily  ? sodium chloride flush  3 mL Intravenous Q12H  ? ?Continuous Infusions: ? sodium chloride 10 mL/hr at 08/25/21 0149  ? cefTRIAXone (ROCEPHIN)  IV 2 g (08/24/21 2244)  ? lactated ringers 150 mL/hr at 08/25/21 1128  ? metronidazole 500 mg (08/25/21 0953)  ? vancomycin 1,500 mg (08/25/21 0209)  ? ?PRN Meds: ?sodium chloride, acetaminophen **OR** acetaminophen, melatonin, ondansetron **OR** ondansetron (ZOFRAN) IV, phenol  ? ?Vital Signs  ?  ?Vitals:  ? 08/25/21 0352 08/25/21 0500 08/25/21 0808 08/25/21 1129  ?BP: 126/74  129/74 134/74  ?Pulse: (!) 119  99 (!) 116  ?Resp: 20  20 (!) 23  ?Temp: (!) 102.4 ?F (39.1 ?C) 99 ?F (37.2 ?C) 99.1 ?F (37.3 ?C) (!) 101.4 ?F (38.6 ?C)  ?TempSrc: Oral  Oral Oral  ?SpO2: 96%  97% 94%  ?Weight:      ?Height:      ? ? ?Intake/Output Summary (Last 24 hours) at 08/25/2021 1312 ?Last data filed at 08/25/2021 0912 ?Gross per 24 hour  ?Intake 620 ml  ?Output 1425 ml  ?Net -805 ml  ? ? ?  08/23/2021  ?  9:55 AM 08/22/2021  ?  5:22 PM 08/01/2021  ?  9:51 AM  ?Last 3 Weights  ?Weight (lbs) 214 lb 11.2 oz 223 lb 1.7 oz 223 lb  ?Weight (kg) 97.387 kg 101.2 kg 101.152 kg  ?   ? ?Telemetry  ?  ?Sinus tachycardia - Personally Reviewed ? ?ECG  ?  ?Ordered but was not obtained 4/14 - Personally Reviewed ? ?Physical Exam  ? ?GEN: No acute distress.   ?Neck: No JVD ?Cardiac: tachycardic. 2/6 HDM  ?Respiratory: Clear ?GI: Soft, nontender, non-distended  ?MS: trace edema; No deformity. ?Neuro:  Nonfocal  ?Psych: Normal affect  ? ?Labs  ?  ?High Sensitivity Troponin:   ?Recent Labs  ?Lab 08/22/21 ?1740 08/22/21 ?2006   ?TROPONINIHS 45* 57*  ?   ?Chemistry ?Recent Labs  ?Lab 08/22/21 ?1740 08/22/21 ?2006 08/23/21 ?[id number] 08/24/21 ?0154 08/25/21 ?0109  ?NA 133*  --  133* 135 131*  ?K 3.1*  --  3.5 3.5 3.3*  ?CL 98  --  100 102 96*  ?CO2 20*  --  24 23 23   ?GLUCOSE 102*  --  106* 95 102*  ?BUN 8  --  7 <5* <5*  ?CREATININE 0.91  --  0.81 0.78 0.81  ?CALCIUM 9.1  --  8.7* 8.6* 8.3*  ?MG  --  1.8  --   --   --   ?PROT 8.3*  --   --  6.8 6.7  ?ALBUMIN 3.2*  --   --  2.7* 2.5*  ?AST 11*  --   --  11* 10*  ?ALT 16  --   --  12 10  ?ALKPHOS 64  --   --  53 50  ?BILITOT 1.2  --   --  1.3* 0.8  ?GFRNONAA >60  --  >60 >60 >60  ?ANIONGAP 15  --  9 10 12   ?  ?Lipids No results for input(s): CHOL,  TRIG, HDL, LABVLDL, LDLCALC, CHOLHDL in the last 168 hours.  ?Hematology ?Recent Labs  ?Lab 08/23/21 ?[id number] 08/24/21 ?0154 08/25/21 ?0109  ?WBC 11.3* 12.2* 11.9*  ?RBC 4.04* 3.83* 3.69*  ?HGB 9.6* 8.9* 8.8*  ?HCT 30.6* 29.1* 27.6*  ?MCV 75.7* 76.0* 74.8*  ?MCH 23.8* 23.2* 23.8*  ?MCHC 31.4 30.6 31.9  ?RDW 14.8 14.9 14.9  ?PLT 268 249 231  ? ?Thyroid No results for input(s): TSH, FREET4 in the last 168 hours.  ?BNPNo results for input(s): BNP, PROBNP in the last 168 hours.  ?DDimer No results for input(s): DDIMER in the last 168 hours.  ? ?Radiology  ?  ?CT CHEST W CONTRAST ? ?Result Date: 08/25/2021 ?CLINICAL DATA:  Endocarditis.  Pericardial effusion. EXAM: CT CHEST WITH CONTRAST TECHNIQUE: Multidetector CT imaging of the chest was performed during intravenous contrast administration. RADIATION DOSE REDUCTION: This exam was performed according to the departmental dose-optimization program which includes automated exposure control, adjustment of the mA and/or kV according to patient size and/or use of iterative reconstruction technique. CONTRAST:  170mL OMNIPAQUE IOHEXOL 300 MG/ML  SOLN COMPARISON:  06/27/2021 FINDINGS: Cardiovascular: Patient is undergone aortic root replacement since prior study. Moderate anterior mediastinal hematoma is seen with small  amount of fluid or blood along the right heart border, but no significant pericardial effusion. Stable mild cardiomegaly and left ventricular hypertrophy. Mediastinum/Nodes: No masses or pathologically enlarged lymph nodes identified. Lungs/Pleura: New small left pleural effusion and left lower lobe atelectasis. Mild atelectasis also seen in the right lower lobe. Upper Abdomen:  Unremarkable. Musculoskeletal:  No suspicious bone lesions. IMPRESSION: Postop changes from aortic root replacement. Moderate anterior mediastinal hematoma, and small amount of fluid or blood along the right heart border, but no significant pericardial effusion. New small left pleural effusion and bilateral lower lobe atelectasis. Electronically Signed   By: Marlaine Hind M.D.   On: 08/25/2021 08:59  ? ?CT ABDOMEN PELVIS W CONTRAST ? ?Result Date: 08/24/2021 ?CLINICAL DATA:  Abdominal pain. EXAM: CT ABDOMEN AND PELVIS WITH CONTRAST TECHNIQUE: Multidetector CT imaging of the abdomen and pelvis was performed using the standard protocol following bolus administration of intravenous contrast. RADIATION DOSE REDUCTION: This exam was performed according to the departmental dose-optimization program which includes automated exposure control, adjustment of the mA and/or kV according to patient size and/or use of iterative reconstruction technique. CONTRAST:  116mL OMNIPAQUE IOHEXOL 300 MG/ML  SOLN COMPARISON:  CT angio chest 06/27/2021 FINDINGS: Lower chest: There is a small left pleural effusion with atelectasis/airspace disease within the left lower lobe and lingula. Postoperative changes from median sternotomy identified. Small pericardial effusion is identified. There is diffuse soft tissue stranding noted within the imaged portions of the mediastinal fat. Hepatobiliary: 7 mm low-density structure within dome of liver is too small to characterize. No suspicious liver lesions noted. Partially decompressed gallbladder. No biliary dilatation.  Pancreas: Unremarkable. No pancreatic ductal dilatation or surrounding inflammatory changes. Spleen: Normal in size without focal abnormality. Adrenals/Urinary Tract: Normal adrenal glands. Bosniak class 1 left kidney cysts are identified. The largest is in the upper pole measuring 2.3 cm. No follow-up recommended. No nephrolithiasis or hydronephrosis identified bilaterally. Urinary bladder appears normal. Stomach/Bowel: Stomach appears normal. The appendix is visualized and appears normal. No bowel wall thickening, inflammation, or distension. Vascular/Lymphatic: No significant vascular findings are present. No enlarged abdominal or pelvic lymph nodes. Reproductive: Prostate is unremarkable. Other: Trace fluid identified within the dependent portion of the pelvis, nonspecific. No discrete fluid collections identified. No signs of pneumoperitoneum. Musculoskeletal: There  is a sinus tract identified within the soft tissues posterior to the sacrum which extends along the midline of the gluteal folds. This measures approximately 14.6 cm in length extending from the L5-S1 level to the level of the gluteal folds, image 76/7. Soft tissue thickening and gas is identified along the sinus tract without discrete fluid collection. No acute or suspicious osseous findings.  No signs of osteomyelitis. IMPRESSION: 1. No acute findings identified within the abdomen or pelvis. 2. There is a sinus tract identified within the soft tissues posterior to the sacrum which extends from the L5-S1 level to the level of the gluteal folds. Soft tissue thickening and gas is identified along the sinus tract without discrete fluid collection. 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 3. Small left pleural effusion with atelectasis/airspace disease within the left lower lobe and lingula. 4. Small pericardial effusion. Soft tissue stranding within the imaged portions of the mediastinal fat are noted and are nonspecific in the  postoperative time frame. If there is a clinical concern for underlying inflammation or infection within the mediastinum/pericardium then a contrast enhanced CT of the chest would be advised. Electronically Sign

## 2021-08-25 NOTE — Progress Notes (Signed)
?  Transition of Care (TOC) Screening Note ? ? ?Patient Details  ?Name: Martin Stafford ?Date of Birth: 12/20/1990 ? ? ?Transition of Care (TOC) CM/SW Contact:    ?Bary Castilla, LCSW ?Phone Number: ?08/25/2021, 3:14 PM ? ? ? ?Transition of Care Department [facility]) has reviewed patient and no TOC needs have been identified at this time. We will continue to monitor patient advancement through interdisciplinary progression rounds. If new patient transition needs arise, please place a TOC consult. ?  ?

## 2021-08-25 NOTE — Progress Notes (Signed)
?   ? ? ? ? ?[facility] ? ?Date of Admission:  08/22/2021   Total days of inpatient antibiotics 3 ? ?Principal Problem: ?  SIRS (systemic inflammatory response syndrome) (HCC) ?Active Problems: ?  History of excision of pilonidal cyst ?  Hx of repair of aortic root ?  Hypokalemia ?  Infective endocarditis ?     ?    ?Assessment: ?64 YM with presented with N/V and fever  admitted with arotic root abscess. Wbc 14k on admission with temp of 102.1 during hospitalization. ?  ?#Fever/Leukocytosis  2/2 aortic root abscess ?#Aortic root abscess SP AVR on 2/24 with Hx of aortic root abscess on TEE on 2/21 ?#Cx negative endocarditis SP daptomycin and CTX x 6 weeks (EOT 08/15/21) ?#Pilonidal cyst and hydranitis SP excision on 2/22 ?-Found to have aortic  root abscess on 2/21 TEE ?-Underwent AVR on 2/24: Cx negative. UW PCR was sent but unable to run. Followed by ID(Dr. Luciana Axe) pt complete dapto+ ctx on 4/6(6 weeks) ?-TTE on 4/10 showed EF mildly reduced and LV dilated compared to previous TEE.  ?-He developed  chills , chest discomfort on 4/9 followed by diarrhea(PICC line pulled 4/10). Seen by Cards on 4/13 and sent to ED for evaluation due ro concern for sepsis and recurrent endocarditis ?-TEE  on admission showed large cvity partially filled with debris surrounding AV annulus extending into aortic root c/w large annular aortic root abscess ?-CTS consulted and pt is not a canddiate for reoperative surgical therapay at this time. Noted abnormality seen on echo more likely due to post surgical changes as intial OR cx were sterile.  ?Pt continues to fever on broad spectrum antibiotics. CT AP showed no acute findings in abdomen/pelvis. Soft tissue stranding of mediastinal fat, possible post-op changes. Will get CT chest but at this point the source of pt's fever/leukocytosis is the aortic abscess.  ?Recommendations:  ?-Continue vancomycin and  ceftriaxone.  ?-Follow blood Cx ?-Follow-up CT  chest. ? ? ? ?Microbiology:   ?Antibiotics: ?Daptomycin and ctx complete 4/6 ? Vancomcyin cefepime and metronidazole 4/13-p ?Cultures: ?Blood ?4/13 NG ? ? ?SUBJECTIVE: ?Resting in bed. No new complaints ?Interval: ?Tmax of 102.4 overnight.  ?Review of Systems: ?ROS ? ? ?Scheduled Meds: ? aspirin EC  81 mg Oral Daily  ? heparin  5,000 Units Subcutaneous Q8H  ? metoCLOPramide  5 mg Oral TID AC  ? sodium chloride flush  3 mL Intravenous Q12H  ? ?Continuous Infusions: ? sodium chloride 10 mL/hr at 08/25/21 0149  ? cefTRIAXone (ROCEPHIN)  IV 2 g (08/24/21 2244)  ? lactated ringers 150 mL/hr at 08/25/21 0339  ? metronidazole 500 mg (08/24/21 2121)  ? vancomycin 1,500 mg (08/25/21 0209)  ? ?PRN Meds:.sodium chloride, acetaminophen **OR** acetaminophen, melatonin, ondansetron **OR** ondansetron (ZOFRAN) IV, phenol ?Allergies  ?Allergen Reactions  ? Eggs Or Egg-Derived Products   ?  "eggs"  ? Peanut-Containing Drug Products   ?  "peanuts"  ? ? ?OBJECTIVE: ?Vitals:  ? 08/24/21 2044 08/25/21 0006 08/25/21 0352 08/25/21 0500  ?BP:  136/69 126/74   ?Pulse:  (!) 113 (!) 119   ?Resp:  20 20   ?Temp: 98.8 ?F (37.1 ?C) 100.3 ?F (37.9 ?C) (!) 102.4 ?F (39.1 ?C) 99 ?F (37.2 ?C)  ?TempSrc:  Oral Oral   ?SpO2:  100% 96%   ?Weight:      ?Height:      ? ?Body mass index is 29.94 kg/m?. ? ?Physical Exam ? ? ? ?Lab Results ?  Lab Results  ?Component Value Date  ? WBC 11.9 (H) 08/25/2021  ? HGB 8.8 (L) 08/25/2021  ? HCT 27.6 (L) 08/25/2021  ? MCV 74.8 (L) 08/25/2021  ? PLT 231 08/25/2021  ?  ?Lab Results  ?Component Value Date  ? CREATININE 0.81 08/25/2021  ? BUN <5 (L) 08/25/2021  ? NA 131 (L) 08/25/2021  ? K 3.3 (L) 08/25/2021  ? CL 96 (L) 08/25/2021  ? CO2 23 08/25/2021  ?  ?Lab Results  ?Component Value Date  ? ALT 10 08/25/2021  ? AST 10 (L) 08/25/2021  ? ALKPHOS 50 08/25/2021  ? BILITOT 0.8 08/25/2021  ?  ? ? ? ? ?Danelle Earthly, MD ?[facility] ?[facility] ?08/25/2021, 7:10 AM  ?

## 2021-08-25 NOTE — Progress Notes (Signed)
?PROGRESS NOTE ? ? ?Martin Stafford  QQI:[id number] DOB: Oct 20, 1990 DOA: 08/22/2021 ?PCP: Patient, No Pcp Per (Inactive)  ?Brief Narrative:  ? ?31 year old male culture-negative endocarditis aortic valve status post homograft replacement 07/05/2021 Dr. Madlyn Frankel on daptomycin and ceftriaxone and completed the same 08/13/2021 under guidance of ID ?Follow-up TTE 08/19/2021 = hypokinesis AV regurg the source was felt to be a pilonidal cyst ? ?Developed chills malaise chest discomfort nausea vomiting since 4/9 sternotomy site has healed no erythema ?Evaluated in cardiology clinic 4/13 and concerns for worsening aortic insufficiency sepsis criteria met on admission with tachycardia chest pain and was febrile and sent to the emergency room ?Found to be in sinus tach at 123 WBC 14 platelet 302 INR 1.4 Tmax 101.2 ? ?Cardiology evaluated did stat TTE ?TEE performed 4/14 confirms abnormalities including large cavity with debris + severe AR +?  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 Darcey Nora and there is no current surgical role for management as the risks outweigh the benefits of the same at this time ? ?ID input appreciated ? ?Hospital-Problem based course ? 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 Darcey Nora ?Per CVTS/ID ?Preliminary blood cultures X2 on 4/13 = NGTD ?Spiking fever 102 on 4/15 ?NS 100 cc/H ?Prior pilonidal cyst status post resection 07/03/2021 Dr. Coralie Keens ?CT abdomen pelvis rules out infection 4/15 ?Current antibiotics per infectious disease = ceftriaxone, Flagyl, vancomycin ?Sinus tachycardia ?Secondary to infectious etiology probably in addition to altered dynamics with regards to aortic valve abscess?  Other-increase fluids to 150 cc/H ?Reassess ?Holding beta-blocker at this time ?Hypokalemia on admission ?Replaced orally --repeat dose K. Dur 40 today ?Check am mag ?Likely anemia chronic disease ?MCV is low hemoglobin is  9.6-monitor trends ? ? ?DVT prophylaxis: Heparin full ?Code Status: Full ?Family Communication: No family present at this time ?Disposition:  ? ?Patient is not ready for discharge-he remains ill  ? ? ?Status is: Inpatient ?Remains inpatient appropriate because: High risk and will probably need further surgery versus prolonged antibiotics dependent on consultant input ?  ?Consultants:  ?Cardiology Dr. Margaretann Loveless ? cardiothoracic surgery Dr. Darcey Nora ? ?Procedures: TTE 4/13 ?TEE 4/14 pending ? ?Antimicrobials:  ? ?Currently vancomycin and cefepime  ? ? ?Subjective: ? ?Does not feel well still feels a little warm-ate something yesterday morning and today as well has eaten-still feels pretty sick ?No chest pain at this time ?Potassium is low which was replaced ?I have told him that we will need to conference with specialist with regards to the next steps in care ? ?Objective: ?Vitals:  ? 08/25/21 0006 08/25/21 0352 08/25/21 0500 08/25/21 0808  ?BP: 136/69 126/74  129/74  ?Pulse: (!) 113 (!) 119  99  ?Resp: 20 20  20   ?Temp: 100.3 ?F (37.9 ?C) (!) 102.4 ?F (39.1 ?C) 99 ?F (37.2 ?C) 99.1 ?F (37.3 ?C)  ?TempSrc: Oral Oral  Oral  ?SpO2: 100% 96%  97%  ?Weight:      ?Height:      ? ? ?Intake/Output Summary (Last 24 hours) at 08/25/2021 1031 ?Last data filed at 08/25/2021 0912 ?Gross per 24 hour  ?Intake 620 ml  ?Output 1425 ml  ?Net -805 ml  ? ? ?Filed Weights  ? 08/22/21 1722 08/23/21 0955  ?Weight: 101.2 kg 97.4 kg  ? ? ?Examination: ? ?tired appearing black male looking older than stated age ?No icterus no pallor ?Neck soft supple ?H4-R7 holosystolic murmur ?Abdomen is soft no rebound ?Rectal exam 4 x 1 cm linear abscess in  the natal cleft I did not probe or examine further ?No lower extremity edema ?Neurologically intact moving all 4 limbs equally without focal deficit ? ?Data Reviewed: personally reviewed  ? ?CBC ?   ?Component Value Date/Time  ? WBC 11.9 (H) 08/25/2021 0109  ? RBC 3.69 (L) 08/25/2021 0109  ? HGB 8.8 (L)  08/25/2021 0109  ? HCT 27.6 (L) 08/25/2021 0109  ? PLT 231 08/25/2021 0109  ? MCV 74.8 (L) 08/25/2021 0109  ? MCH 23.8 (L) 08/25/2021 0109  ? MCHC 31.9 08/25/2021 0109  ? RDW 14.9 08/25/2021 0109  ? LYMPHSABS 1.1 08/24/2021 0154  ? MONOABS 1.1 (H) 08/24/2021 0154  ? EOSABS 0.1 08/24/2021 0154  ? BASOSABS 0.1 08/24/2021 0154  ? ? ?  Latest Ref Rng & Units 08/25/2021  ?  1:09 AM 08/24/2021  ?  1:54 AM 08/23/2021  ?  2:45 AM  ?CMP  ?Glucose 70 - 99 mg/dL 102   95   106    ?BUN 6 - 20 mg/dL <5   <5   7    ?Creatinine 0.61 - 1.24 mg/dL 0.81   0.78   0.81    ?Sodium 135 - 145 mmol/L 131   135   133    ?Potassium 3.5 - 5.1 mmol/L 3.3   3.5   3.5    ?Chloride 98 - 111 mmol/L 96   102   100    ?CO2 22 - 32 mmol/L 23   23   24     ?Calcium 8.9 - 10.3 mg/dL 8.3   8.6   8.7    ?Total Protein 6.5 - 8.1 g/dL 6.7   6.8     ?Total Bilirubin 0.3 - 1.2 mg/dL 0.8   1.3     ?Alkaline Phos 38 - 126 U/L 50   53     ?AST 15 - 41 U/L 10   11     ?ALT 0 - 44 U/L 10   12     ? ? ? ?Radiology Studies: ?CT CHEST W CONTRAST ? ?Result Date: 08/25/2021 ?CLINICAL DATA:  Endocarditis.  Pericardial effusion. EXAM: CT CHEST WITH CONTRAST TECHNIQUE: Multidetector CT imaging of the chest was performed during intravenous contrast administration. RADIATION DOSE REDUCTION: This exam was performed according to the departmental dose-optimization program which includes automated exposure control, adjustment of the mA and/or kV according to patient size and/or use of iterative reconstruction technique. CONTRAST:  158m OMNIPAQUE IOHEXOL 300 MG/ML  SOLN COMPARISON:  06/27/2021 FINDINGS: Cardiovascular: Patient is undergone aortic root replacement since prior study. Moderate anterior mediastinal hematoma is seen with small amount of fluid or blood along the right heart border, but no significant pericardial effusion. Stable mild cardiomegaly and left ventricular hypertrophy. Mediastinum/Nodes: No masses or pathologically enlarged lymph nodes identified.  Lungs/Pleura: New small left pleural effusion and left lower lobe atelectasis. Mild atelectasis also seen in the right lower lobe. Upper Abdomen:  Unremarkable. Musculoskeletal:  No suspicious bone lesions. IMPRESSION: Postop changes from aortic root replacement. Moderate anterior mediastinal hematoma, and small amount of fluid or blood along the right heart border, but no significant pericardial effusion. New small left pleural effusion and bilateral lower lobe atelectasis. Electronically Signed   By: JMarlaine HindM.D.   On: 08/25/2021 08:59  ? ?CT ABDOMEN PELVIS W CONTRAST ? ?Result Date: 08/24/2021 ?CLINICAL DATA:  Abdominal pain. EXAM: CT ABDOMEN AND PELVIS WITH CONTRAST TECHNIQUE: Multidetector CT imaging of the abdomen and pelvis was performed using the standard protocol following bolus  administration of intravenous contrast. RADIATION DOSE REDUCTION: This exam was performed according to the departmental dose-optimization program which includes automated exposure control, adjustment of the mA and/or kV according to patient size and/or use of iterative reconstruction technique. CONTRAST:  140m OMNIPAQUE IOHEXOL 300 MG/ML  SOLN COMPARISON:  CT angio chest 06/27/2021 FINDINGS: Lower chest: There is a small left pleural effusion with atelectasis/airspace disease within the left lower lobe and lingula. Postoperative changes from median sternotomy identified. Small pericardial effusion is identified. There is diffuse soft tissue stranding noted within the imaged portions of the mediastinal fat. Hepatobiliary: 7 mm low-density structure within dome of liver is too small to characterize. No suspicious liver lesions noted. Partially decompressed gallbladder. No biliary dilatation. Pancreas: Unremarkable. No pancreatic ductal dilatation or surrounding inflammatory changes. Spleen: Normal in size without focal abnormality. Adrenals/Urinary Tract: Normal adrenal glands. Bosniak class 1 left kidney cysts are identified.  The largest is in the upper pole measuring 2.3 cm. No follow-up recommended. No nephrolithiasis or hydronephrosis identified bilaterally. Urinary bladder appears normal. Stomach/Bowel: Stomach appears normal. The appendix is

## 2021-08-26 ENCOUNTER — Encounter (HOSPITAL_COMMUNITY): Payer: Self-pay | Admitting: Cardiology

## 2021-08-26 DIAGNOSIS — T827XXA Infection and inflammatory reaction due to other cardiac and vascular devices, implants and grafts, initial encounter: Secondary | ICD-10-CM

## 2021-08-26 DIAGNOSIS — I358 Other nonrheumatic aortic valve disorders: Secondary | ICD-10-CM

## 2021-08-26 DIAGNOSIS — A419 Sepsis, unspecified organism: Secondary | ICD-10-CM

## 2021-08-26 DIAGNOSIS — I351 Nonrheumatic aortic (valve) insufficiency: Secondary | ICD-10-CM

## 2021-08-26 DIAGNOSIS — R6510 Systemic inflammatory response syndrome (SIRS) of non-infectious origin without acute organ dysfunction: Secondary | ICD-10-CM | POA: Diagnosis not present

## 2021-08-26 LAB — SEDIMENTATION RATE: Sed Rate: 55 mm/hr — ABNORMAL HIGH (ref 0–16)

## 2021-08-26 MED ORDER — SODIUM CHLORIDE 0.9 % IV SOLN
900.0000 mg | Freq: Every day | INTRAVENOUS | Status: DC
  Administered 2021-08-26: 900 mg via INTRAVENOUS
  Filled 2021-08-26 (×2): qty 18

## 2021-08-26 MED ORDER — METOPROLOL TARTRATE 5 MG/5ML IV SOLN: 2.5000 mg | Freq: Once | INTRAVENOUS | Status: AC

## 2021-08-26 MED ORDER — NAPROXEN 375 MG PO TABS
375.0000 mg | ORAL_TABLET | Freq: Two times a day (BID) | ORAL | Status: DC
Start: 2021-08-26 — End: 2021-08-27
  Administered 2021-08-26: 375 mg via ORAL
  Filled 2021-08-26 (×2): qty 1

## 2021-08-26 MED ORDER — MENTHOL 3 MG MT LOZG
1.0000 | LOZENGE | OROMUCOSAL | Status: DC
  Administered 2021-08-26 – 2021-08-27 (×3): 3 mg via ORAL
  Filled 2021-08-26: qty 9

## 2021-08-26 MED ORDER — METOPROLOL TARTRATE 5 MG/5ML IV SOLN
INTRAVENOUS | Status: AC
  Administered 2021-08-26: 2.5 mg
  Filled 2021-08-26: qty 5

## 2021-08-26 MED ORDER — ACETAMINOPHEN 500 MG PO TABS
500.0000 mg | ORAL_TABLET | Freq: Once | ORAL | Status: AC
  Administered 2021-08-27: 500 mg via ORAL
  Filled 2021-08-26: qty 1

## 2021-08-26 MED ORDER — ACETAMINOPHEN 500 MG PO TABS
500.0000 mg | ORAL_TABLET | Freq: Four times a day (QID) | ORAL | Status: DC
  Administered 2021-08-26 – 2021-08-27 (×3): 500 mg via ORAL
  Filled 2021-08-26 (×3): qty 1

## 2021-08-26 NOTE — Progress Notes (Addendum)
?PROGRESS NOTE ? ? ?Martin Stafford  MRN:[id number] DOB: 07/15/1990 DOA: 08/22/2021 ?PCP: Patient, No Pcp Per (Inactive)  ?Brief Narrative:  ? 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 Vantrigt-placed on daptomycin and ceftriaxone and completed the same 08/13/2021 under guidance of ID ?Follow-up TTE 08/19/2021 = hypokinesis AV regurg the source was felt to be a pilonidal cyst ? ?Developed chills malaise chest discomfort nausea vomiting since 4/9 sternotomy site has healed no erythema ?Evaluated in cardiology clinic 4/13 and concerns for worsening aortic insufficiency sepsis criteria met on admission with tachycardia chest pain and was febrile and sent to the emergency room ?Found to be in sinus tach at 123 WBC 14 platelet 302 INR 1.4 Tmax 101.2 ? ?Cardiology evaluated did stat TTE ?TEE performed 4/14 confirms abnormalities including large cavity with debris + severe AR +?  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 Vantrigt and there is no current surgical role for management as the risks outweigh the benefits of the same at this time ? ?ID input appreciated ? ?Hospital-Problem based course ? 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 Vantrigt-this was confirmed by Dr. Maya Singh of ID ?Preliminary blood cultures X2 on 4/13 = NGTD--recurrent cultures 4/15 no growth to date ?Scheduled Tylenol 500 every 6-low-dose Naprosyn 375 twice daily as well ??  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 Douglas Blackman ?CT abdomen pelvis rules out infection 4/15 ?Current antibiotics per infectious disease = daptomycin ceftriaxone Flagyl ?Sinus tachycardia ?Secondary to infectious etiology probably in addition to altered dynamics with regards to aortic valve ?Beta-blocker on hold at this time ?Advance fluids to 75 cc/H ?Hypokalemia on admission ?Recheck labs in the morning recheck labs in a.m. ?Likely anemia chronic disease ? recheck labs in the morning ? ?DVT prophylaxis: Heparin full ?Code Status: Full ?Family Communication: Discussed with mother Vanessa at the bedside in addition to girlfriend Khadijah and they understand that the plan of care is a possible attempt at transfer to [facility] ?Disposition:  ? ?Patient is not ready for discharge-he remains ill  ? ? ?Status is: Inpatient ?Remains inpatient appropriate because: High risk and will probably need further surgery versus prolonged antibiotics dependent on consultant input ?  ?Consultants:  ?Cardiology Dr. Acharya ? cardiothoracic surgery Dr. Vantrigt ? ?Procedures: TTE 4/13 ?TEE 4/14 pending ? ?Antimicrobials:  ? ?Currently vancomycin and cefepime  ? ? ?Subjective: ? ?Seen when patient was having paroxysms of fever chills and dry heaves ?Patient counseled and feels better after Zofran ?Some discomfort in abdomen/chest but this is better also ? ?Objective: ?Vitals:  ? 08/26/21 1139 08/26/21 1145 08/26/21 1155 08/26/21 1233  ?BP: (!) 167/68  (!) 156/97 (!) 165/90  ?Pulse: (!) 113  86 66  ?Resp: (!) 32  (!) 21 (!) 22  ?Temp: 99.1 ?F (37.3 ?C) (!) 100.9 ?F (38.3 ?C)    ?TempSrc: Oral Rectal    ?SpO2: 100%  93% 99%  ?Weight:      ?  Height:      ? ? ?Intake/Output Summary (Last 24 hours) at 08/26/2021 1649 ?Last data filed at 08/26/2021 1536 ?Gross per 24 hour   ?Intake 5143.7 ml  ?Output 770 ml  ?Net 4373.7 ml  ? ? ?Filed Weights  ? 08/22/21 1722 08/23/21 0955  ?Weight: 101.2 kg 97.4 kg  ? ? ?Examination: ? ?tired appearing black male looking older than stated age ?No icterus no pallor ?Neck soft supple ?S1-S2 holosystolic murmur ?Abdomen is soft no rebound ?Rectal exam 4 x 1 cm linear abscess in the natal cleft  ?No lower extremity edema ?Neurologically intact moving all 4 limbs equally without focal deficit ? ?Data Reviewed: personally reviewed  ? ?CBC ?   ?Component Value Date/Time  ? WBC 11.9 (H) 08/25/2021 0109  ? RBC 3.69 (L) 08/25/2021 0109  ? HGB 8.8 (L) 08/25/2021 0109  ? HCT 27.6 (L) 08/25/2021 0109  ? PLT 231 08/25/2021 0109  ? MCV 74.8 (L) 08/25/2021 0109  ? MCH 23.8 (L) 08/25/2021 0109  ? MCHC 31.9 08/25/2021 0109  ? RDW 14.9 08/25/2021 0109  ? LYMPHSABS 1.1 08/24/2021 0154  ? MONOABS 1.1 (H) 08/24/2021 0154  ? EOSABS 0.1 08/24/2021 0154  ? BASOSABS 0.1 08/24/2021 0154  ? ? ?  Latest Ref Rng & Units 08/25/2021  ?  1:09 AM 08/24/2021  ?  1:54 AM 08/23/2021  ?  2:45 AM  ?CMP  ?Glucose 70 - 99 mg/dL 102   95   106    ?BUN 6 - 20 mg/dL <5   <5   7    ?Creatinine 0.61 - 1.24 mg/dL 0.81   0.78   0.81    ?Sodium 135 - 145 mmol/L 131   135   133    ?Potassium 3.5 - 5.1 mmol/L 3.3   3.5   3.5    ?Chloride 98 - 111 mmol/L 96   102   100    ?CO2 22 - 32 mmol/L 23   23   24    ?Calcium 8.9 - 10.3 mg/dL 8.3   8.6   8.7    ?Total Protein 6.5 - 8.1 g/dL 6.7   6.8     ?Total Bilirubin 0.3 - 1.2 mg/dL 0.8   1.3     ?Alkaline Phos 38 - 126 U/L 50   53     ?AST 15 - 41 U/L 10   11     ?ALT 0 - 44 U/L 10   12     ? ? ? ?Radiology Studies: ?CT CHEST W CONTRAST ? ?Result Date: 08/25/2021 ?CLINICAL DATA:  Endocarditis.  Pericardial effusion. EXAM: CT CHEST WITH CONTRAST TECHNIQUE: Multidetector CT imaging of the chest was performed during intravenous contrast administration. RADIATION DOSE REDUCTION: This exam was performed according to the departmental dose-optimization program which  includes automated exposure control, adjustment of the mA and/or kV according to patient size and/or use of iterative reconstruction technique. CONTRAST:  100mL OMNIPAQUE IOHEXOL 300 MG/ML  SOLN COMPARISON:  06/27/2021 FINDINGS: Cardiovascular: Patient is undergone aortic root replacement since prior study. Moderate anterior mediastinal hematoma is seen with small amount of fluid or blood along the right heart border, but no significant pericardial effusion. Stable mild cardiomegaly and left ventricular hypertrophy. Mediastinum/Nodes: No masses or pathologically enlarged lymph nodes identified. Lungs/Pleura: New small left pleural effusion and left lower lobe atelectasis. Mild atelectasis also seen in the right lower lobe. Upper Abdomen:  Unremarkable. Musculoskeletal:  No suspicious bone lesions. IMPRESSION: Postop changes from aortic   root replacement. Moderate anterior mediastinal hematoma, and small amount of fluid or blood along the right heart border, but no significant pericardial effusion. New small left pleural effusion and bilateral lower lobe atelectasis. Electronically Signed   By: John A Stahl M.D.   On: 08/25/2021 08:59  ? ?CT ABDOMEN PELVIS W CONTRAST ? ?Result Date: 08/24/2021 ?CLINICAL DATA:  Abdominal pain. EXAM: CT ABDOMEN AND PELVIS WITH CONTRAST TECHNIQUE: Multidetector CT imaging of the abdomen and pelvis was performed using the standard protocol following bolus administration of intravenous contrast. RADIATION DOSE REDUCTION: This exam was performed according to the departmental dose-optimization program which includes automated exposure control, adjustment of the mA and/or kV according to patient size and/or use of iterative reconstruction technique. CONTRAST:  100mL OMNIPAQUE IOHEXOL 300 MG/ML  SOLN COMPARISON:  CT angio chest 06/27/2021 FINDINGS: Lower chest: There is a small left pleural effusion with atelectasis/airspace disease within the left lower lobe and lingula. Postoperative changes  from median sternotomy identified. Small pericardial effusion is identified. There is diffuse soft tissue stranding noted within the imaged portions of the mediastinal fat. Hepatobiliary: 7 mm low-de

## 2021-08-26 NOTE — Plan of Care (Signed)

## 2021-08-26 NOTE — Progress Notes (Signed)
Pharmacy Antibiotic Note ? ?Martin Stafford is a 31 y.o. male admitted on 08/22/2021 with  culture negative endocarditis . 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 Concern for aortitis and pt remains febrile - pharmacy asked to transition to daptomycin. Pt previously on 900mg  (~10mg /kg) daily - will utilize this dosing.  ? ? ?Plan: ?Daptomycin 900kg q24h ?Check CK in am ? ?Height: 5\' 11"  (180.3 cm) ?Weight: 97.4 kg (214 lb 11.2 oz) ?IBW/kg (Calculated) : 75.3 ? ?Temp (24hrs), Avg:100.3 ?F (37.9 ?C), Min:98.3 ?F (36.8 ?C), Max:103.2 ?F (39.6 ?C) ? ?Recent Labs  ?Lab 08/22/21 ?1740 08/22/21 ?2006 08/23/21 ?0245 08/24/21 ?0154 08/25/21 ?0109 08/25/21 ?0518 08/25/21 ?1206  ?WBC 14.0*  --  11.3* 12.2* 11.9*  --   --   ?CREATININE 0.91  --  0.81 0.78 0.81  --   --   ?LATICACIDVEN 0.9 0.9  --   --   --   --   --   ?VANCOTROUGH  --   --   --   --   --   --  5*  ?VANCOPEAK  --   --   --   --  4* 25*  --   ? ?  ?Estimated Creatinine Clearance: 160.1 mL/min (by C-G formula based on SCr of 0.81 mg/dL).   ? ?Allergies  ?Allergen Reactions  ? Eggs Or Egg-Derived Products   ?  "eggs"  ? Peanut-Containing Drug Products   ?  "peanuts"  ? ? ?Antimicrobials this admission: ?Cefepime 4/13 >> 4/15 ?Vancomycin 4/13 >>4/17 ?Daptomycin 4/17 >> ?Ceftriaxone 4/15 >> ?Metronidazole 4/14 >> ? ? ? ?Microbiology results: ?4/15 Bcx pending ?4/13 BCx: ngtd ? ? ? ?Thank you for involving pharmacy in this patient's care. ? ?5/15, PharmD, BCPS, BCCP ?Clinical Pharmacist ?Please check AMION for all [facility] numbers ?08/26/2021 ? ?

## 2021-08-26 NOTE — Significant Event (Signed)
Rapid Response Event Note  ? ?Reason for Call :  ?Rigors, rapid HR and RR, shortness of breath ? ?Initial Focused Assessment:  ?Patient states his shortness of breath is better with the 2L [place].  Lung sounds are decreased in bases.   ?He is mildly diaphoretic.   ?He is having significant rigors ?He has also been nauseated, has received zofran. ?HR 140s has received Lopressor IV ?Tachypnea but no increased work of breathing ? ?BP 156/97  HR 115  RR 36  O2 sat 98% ?Oral temp 99,  Rectal temp 100.9 ? ?Dr Mahala Menghini at bedside to assess patient ? ?Interventions:  ?Lopressor ?Zofran ? ?Plan of Care:  ? ? ? ?Event Summary:  ? ?MD Notified: Samtani at bedside ?Call Time: 1130 ?Arrival Time: 1133 ?End Time: 1230 ? ?Marcellina Millin, RN ?

## 2021-08-26 NOTE — Patient Care Conference (Addendum)
? ? ?  Called and discussed via transfer line with [facility] CVTS Dr. Norris Cross --patient is not a candidate for resurgery so soon after her initial surgery ? ?Discussed with Dr. Dagoberto Reef of who Hospital medicine recommends discussion with rheumatology ? ?Conference with Rheumatology and patient is accepted to [facility] for tertiary care ? ?>39 minutes indirect patient care coordination time ? ?Pleas Koch, MD ?[facility] ?5:21 PM ? ?

## 2021-08-26 NOTE — Progress Notes (Signed)
?   08/26/21 1139  ?Assess: MEWS Score  ?Temp 99.1 ?F (37.3 ?C)  ?BP (!) 167/68  ?Pulse Rate (!) 113  ?ECG Heart Rate (!) 113  ?Resp (!) 32  ?Level of Consciousness Alert  ?SpO2 100 %  ?O2 Device Nasal Cannula  ?Patient Activity (if Appropriate) In bed  ?O2 Flow Rate (L/min) 2 L/min  ?Assess: MEWS Score  ?MEWS Temp 0  ?MEWS Systolic 0  ?MEWS Pulse 2  ?MEWS RR 2  ?MEWS LOC 0  ?MEWS Score 4  ?MEWS Score Color Red  ?Assess: if the MEWS score is Yellow or Red  ?Were vital signs taken at a resting state? Yes  ?Focused Assessment Change from prior assessment (see assessment flowsheet) ?(symptoms have occurred before, but not during this shift)  ?Early Detection of Sepsis Score *See Row Information* Medium  ?MEWS guidelines implemented *See Row Information* No, previously red, continue vital signs every 4 hours  ?Notify: Provider  ?Provider Name/Title Dr. Mahala Menghini  ?Date Provider Notified 08/26/21  ?Time Provider Notified 1139  ?Notification Type Face-to-face  ?Notification Reason Change in status  ?Provider response At bedside  ?Notify: Rapid Response  ?Name of Rapid Response RN Notified Council Mechanic RN  ?Date Rapid Response Notified 08/26/21  ?Time Rapid Response Notified 1139 ?(came to bedside)  ?Document  ?Patient Outcome Stabilized after interventions ?(2.5 IV metoprolol and 4 mg IV zofran given)  ?Progress note created (see row info) Yes  ? ? ?

## 2021-08-26 NOTE — Progress Notes (Signed)
? ?Progress Note ? ?Patient Name: Martin Stafford ?Date of Encounter: 08/26/2021 ? ?[facility] Cardiologist: Thurmon Fair, MD  ? ?Subjective  ? ?Lying almost fully supine in bed without respiratory difficulty, sleeping comfortably. ?Continues to have fever to 103.2. ?Hemodynamically compensated. ?All cultures negative to date, on 3 antibiotics. ? ? ?Inpatient Medications  ?  ?Scheduled Meds: ? aspirin EC  81 mg Oral Daily  ? heparin  5,000 Units Subcutaneous Q8H  ? metoCLOPramide  5 mg Oral TID AC  ? potassium chloride  40 mEq Oral Daily  ? sodium chloride flush  3 mL Intravenous Q12H  ? ?Continuous Infusions: ? sodium chloride 10 mL/hr at 08/25/21 0149  ? cefTRIAXone (ROCEPHIN)  IV 2 g (08/25/21 2201)  ? lactated ringers 150 mL/hr at 08/25/21 2125  ? metronidazole 500 mg (08/25/21 2311)  ? vancomycin 1,250 mg (08/26/21 0550)  ? ?PRN Meds: ?sodium chloride, acetaminophen **OR** acetaminophen, melatonin, ondansetron **OR** ondansetron (ZOFRAN) IV, phenol  ? ?Vital Signs  ?  ?Vitals:  ? 08/26/21 0019 08/26/21 0425 08/26/21 6440 08/26/21 0737  ?BP: 128/73 (!) 155/94  134/61  ?Pulse: (!) 111 (!) 119  (!) 106  ?Resp: 20 (!) 22  20  ?Temp: 98.5 ?F (36.9 ?C) (!) 103.2 ?F (39.6 ?C) (!) 103 ?F (39.4 ?C) 98.7 ?F (37.1 ?C)  ?TempSrc: Oral Oral Oral Oral  ?SpO2: 96% 93%  97%  ?Weight:      ?Height:      ? ? ?Intake/Output Summary (Last 24 hours) at 08/26/2021 0851 ?Last data filed at 08/26/2021 0550 ?Gross per 24 hour  ?Intake 2719.45 ml  ?Output 920 ml  ?Net 1799.45 ml  ? ? ?  08/23/2021  ?  9:55 AM 08/22/2021  ?  5:22 PM 08/01/2021  ?  9:51 AM  ?Last 3 Weights  ?Weight (lbs) 214 lb 11.2 oz 223 lb 1.7 oz 223 lb  ?Weight (kg) 97.387 kg 101.2 kg 101.152 kg  ?   ? ?Telemetry  ?  ?Sinus tachycardia- Personally Reviewed ? ?ECG  ?  ?Sinus tachycardia, LVH with prominent repolarization changes - Personally Reviewed ? ?Physical Exam  ?Appears comfortable ?GEN: No acute distress.   ?Neck: No JVD ?Cardiac: RRR, 2/6 aortic ejection  murmur, barely audible regurgitation diastolic murmur, no rubs or gallops.  ?Respiratory: Clear to auscultation bilaterally. ?GI: Soft, nontender, non-distended  ?MS: No edema; No deformity. ?Neuro:  Nonfocal  ?Psych: Normal affect  ? ?Labs  ?  ?High Sensitivity Troponin:   ?Recent Labs  ?Lab 08/22/21 ?1740 08/22/21 ?2006  ?TROPONINIHS 45* 57*  ?   ?Chemistry ?Recent Labs  ?Lab 08/22/21 ?1740 08/22/21 ?2006 08/23/21 ?3474 08/24/21 ?0154 08/25/21 ?0109  ?NA 133*  --  133* 135 131*  ?K 3.1*  --  3.5 3.5 3.3*  ?CL 98  --  100 102 96*  ?CO2 20*  --  24 23 23   ?GLUCOSE 102*  --  106* 95 102*  ?BUN 8  --  7 <5* <5*  ?CREATININE 0.91  --  0.81 0.78 0.81  ?CALCIUM 9.1  --  8.7* 8.6* 8.3*  ?MG  --  1.8  --   --   --   ?PROT 8.3*  --   --  6.8 6.7  ?ALBUMIN 3.2*  --   --  2.7* 2.5*  ?AST 11*  --   --  11* 10*  ?ALT 16  --   --  12 10  ?ALKPHOS 64  --   --  53 50  ?  BILITOT 1.2  --   --  1.3* 0.8  ?GFRNONAA >60  --  >60 >60 >60  ?ANIONGAP 15  --  9 10 12   ?  ?Lipids No results for input(s): CHOL, TRIG, HDL, LABVLDL, LDLCALC, CHOLHDL in the last 168 hours.  ?Hematology ?Recent Labs  ?Lab 08/23/21 ?[id number] 08/24/21 ?0154 08/25/21 ?0109  ?WBC 11.3* 12.2* 11.9*  ?RBC 4.04* 3.83* 3.69*  ?HGB 9.6* 8.9* 8.8*  ?HCT 30.6* 29.1* 27.6*  ?MCV 75.7* 76.0* 74.8*  ?MCH 23.8* 23.2* 23.8*  ?MCHC 31.4 30.6 31.9  ?RDW 14.8 14.9 14.9  ?PLT 268 249 231  ? ?Thyroid No results for input(s): TSH, FREET4 in the last 168 hours.  ?BNPNo results for input(s): BNP, PROBNP in the last 168 hours.  ?DDimer No results for input(s): DDIMER in the last 168 hours.  ? ?Radiology  ?  ?CT CHEST W CONTRAST ? ?Result Date: 08/25/2021 ?CLINICAL DATA:  Endocarditis.  Pericardial effusion. EXAM: CT CHEST WITH CONTRAST TECHNIQUE: Multidetector CT imaging of the chest was performed during intravenous contrast administration. RADIATION DOSE REDUCTION: This exam was performed according to the departmental dose-optimization program which includes automated exposure control,  adjustment of the mA and/or kV according to patient size and/or use of iterative reconstruction technique. CONTRAST:  100mL OMNIPAQUE IOHEXOL 300 MG/ML  SOLN COMPARISON:  06/27/2021 FINDINGS: Cardiovascular: Patient is undergone aortic root replacement since prior study. Moderate anterior mediastinal hematoma is seen with small amount of fluid or blood along the right heart border, but no significant pericardial effusion. Stable mild cardiomegaly and left ventricular hypertrophy. Mediastinum/Nodes: No masses or pathologically enlarged lymph nodes identified. Lungs/Pleura: New small left pleural effusion and left lower lobe atelectasis. Mild atelectasis also seen in the right lower lobe. Upper Abdomen:  Unremarkable. Musculoskeletal:  No suspicious bone lesions. IMPRESSION: Postop changes from aortic root replacement. Moderate anterior mediastinal hematoma, and small amount of fluid or blood along the right heart border, but no significant pericardial effusion. New small left pleural effusion and bilateral lower lobe atelectasis. Electronically Signed   By: Danae OrleansJohn A Stahl M.D.   On: 08/25/2021 08:59  ? ?CT ABDOMEN PELVIS W CONTRAST ? ?Result Date: 08/24/2021 ?CLINICAL DATA:  Abdominal pain. EXAM: CT ABDOMEN AND PELVIS WITH CONTRAST TECHNIQUE: Multidetector CT imaging of the abdomen and pelvis was performed using the standard protocol following bolus administration of intravenous contrast. RADIATION DOSE REDUCTION: This exam was performed according to the departmental dose-optimization program which includes automated exposure control, adjustment of the mA and/or kV according to patient size and/or use of iterative reconstruction technique. CONTRAST:  100mL OMNIPAQUE IOHEXOL 300 MG/ML  SOLN COMPARISON:  CT angio chest 06/27/2021 FINDINGS: Lower chest: There is a small left pleural effusion with atelectasis/airspace disease within the left lower lobe and lingula. Postoperative changes from median sternotomy identified.  Small pericardial effusion is identified. There is diffuse soft tissue stranding noted within the imaged portions of the mediastinal fat. Hepatobiliary: 7 mm low-density structure within dome of liver is too small to characterize. No suspicious liver lesions noted. Partially decompressed gallbladder. No biliary dilatation. Pancreas: Unremarkable. No pancreatic ductal dilatation or surrounding inflammatory changes. Spleen: Normal in size without focal abnormality. Adrenals/Urinary Tract: Normal adrenal glands. Bosniak class 1 left kidney cysts are identified. The largest is in the upper pole measuring 2.3 cm. No follow-up recommended. No nephrolithiasis or hydronephrosis identified bilaterally. Urinary bladder appears normal. Stomach/Bowel: Stomach appears normal. The appendix is visualized and appears normal. No bowel wall thickening, inflammation, or distension. Vascular/Lymphatic: No significant vascular  findings are present. No enlarged abdominal or pelvic lymph nodes. Reproductive: Prostate is unremarkable. Other: Trace fluid identified within the dependent portion of the pelvis, nonspecific. No discrete fluid collections identified. No signs of pneumoperitoneum. Musculoskeletal: There is a sinus tract identified within the soft tissues posterior to the sacrum which extends along the midline of the gluteal folds. This measures approximately 14.6 cm in length extending from the L5-S1 level to the level of the gluteal folds, image 76/7. Soft tissue thickening and gas is identified along the sinus tract without discrete fluid collection. No acute or suspicious osseous findings.  No signs of osteomyelitis. IMPRESSION: 1. No acute findings identified within the abdomen or pelvis. 2. There is a sinus tract identified within the soft tissues posterior to the sacrum which extends from the L5-S1 level to the level of the gluteal folds. Soft tissue thickening and gas is identified along the sinus tract without discrete  fluid collection. 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 3. Small left pleural effusion with atelectasis/airspace disease within the left lower lobe and lingula. 4. Small pericardial effus

## 2021-08-26 NOTE — Progress Notes (Signed)
3 Days Post-Op Procedure(s) (LRB): ?TRANSESOPHAGEAL ECHOCARDIOGRAM (TEE) (N/A) ?Subjective: ?Patient with persistent fever, all cultures have been negative- even intraoperatively and WBC min elevated with elevated ESR. No more cardiac tissue can be  surgically debrided. Consider inflammatory etiology  ie aortitis- consider trial of steroids.Patient would not tolerate more cardiac surgery at present ? ?Objective: ?Vital signs in last 24 hours: ?Temp:  [98.5 ?F (36.9 ?C)-103.2 ?F (39.6 ?C)] 98.7 ?F (37.1 ?C) (04/17 0737) ?Pulse Rate:  [99-119] 106 (04/17 0737) ?Cardiac Rhythm: Sinus tachycardia (04/16 1945) ?Resp:  [20-23] 20 (04/17 0737) ?BP: (119-155)/(61-94) 134/61 (04/17 0737) ?SpO2:  [93 %-99 %] 97 % (04/17 0737) ? ?Hemodynamic parameters for last 24 hours: ? Sinus tach ? ?Intake/Output from previous day: ?04/16 0701 - 04/17 0700 ?In: 2719.5 [P.O.:460; I.V.:1809.5; IV Piggyback:450] ?Out: 920 [Urine:920] ?Intake/Output this shift: ?No intake/output data recorded. ? ?Systolic flow murmur ?Sternal incision healed ? ?Lab Results: ?Recent Labs  ?  08/24/21 ?0154 08/25/21 ?0109  ?WBC 12.2* 11.9*  ?HGB 8.9* 8.8*  ?HCT 29.1* 27.6*  ?PLT 249 231  ? ?BMET:  ?Recent Labs  ?  08/24/21 ?0154 08/25/21 ?0109  ?NA 135 131*  ?K 3.5 3.3*  ?CL 102 96*  ?CO2 23 23  ?GLUCOSE 95 102*  ?BUN <5* <5*  ?CREATININE 0.78 0.81  ?CALCIUM 8.6* 8.3*  ?  ?PT/INR: No results for input(s): LABPROT, INR in the last 72 hours. ?ABG ?   ?Component Value Date/Time  ? PHART 7.406 07/06/2021 1626  ? HCO3 28.9 (H) 07/06/2021 1626  ? TCO2 30 07/06/2021 1626  ? ACIDBASEDEF 1.0 07/05/2021 2159  ? O2SAT 74.1 07/10/2021 0402  ? ?CBG (last 3)  ?No results for input(s): GLUCAP in the last 72 hours. ? ?Assessment/Plan: ?S/P Procedure(s) (LRB): ?TRANSESOPHAGEAL ECHOCARDIOGRAM (TEE) (N/A) ?Cont medical therapy ? ? LOS: 4 days  ? ? ?Dahlia Byes ?08/26/2021 ?  ?

## 2021-08-26 NOTE — Progress Notes (Addendum)
?   ? ? ? ? ?[facility] ? ?Date of Admission:  08/22/2021   Total days of inpatient antibiotics 3 ? ?Principal Problem: ?  SIRS (systemic inflammatory response syndrome) (HCC) ?Active Problems: ?  History of excision of pilonidal cyst ?  Hx of repair of aortic root ?  Hypokalemia ?  Infective endocarditis ?     ?    ?Assessment: ?39 YM with presented with N/V and fever  admitted with arotic root abscess. Wbc 14k on admission with temp of 102.1 during hospitalization. ?  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 [facility] PCR was sent but bacterial sequence was unable to be run. Followed by ID(Dr. Linus Salmons) pt complete dapto+ ctx on 4/6(6 weeks) ?-TTE on 4/10 showed EF mildly reduced and LV dilated compared to previous TEE.  ?-He developed  chills , chest discomfort on 4/9 followed by diarrhea(PICC line pulled 4/10). Seen by Cards on 4/13 and sent to ED for evaluation due ro concern for sepsis and recurrent endocarditis ?-TEE  on admission showed large cvity partially filled with debris surrounding AV annulus extending into aortic root c/w large annular aortic root abscess ?-CTS consulted and pt is not a canddiate for reoperative surgical therapay at this time. Noted abnormality seen on echo more likely due to post surgical changes as intial OR cx were sterile.  ?Pt continues to fever on broad spectrum antibiotics. CT AP showed no acute findings in abdomen/pelvis. Soft tissue stranding of mediastinal fat, possible post-op changes. CT chest was unrevealing for alterative infectious source ? ? ?Recommendations:  ?-Continue vancomycin and  ceftriaxone.  ?-D/C metronidazole ?-I  called and spoke with [facility] regarding PCR results. They CONFIRMED that 16S PCR was ran on tissue sent  and was negative(noted in report). This is what we were testing for. ?As a side note: the report stated bacterial DNA sequence was not done, which is accurate as this test is not done on tissue rather on plated colonies.  ?-Given OR Cx were negative, 16S RNA negative, persistent fever on antibiotics, worsening TEE finding after 6 weeks of antibiotics , path from OR showed leaflet fibrosis and aortic tissue showed inflammation in adventitia: I think aortitis /vasculitis/autoimmune process is plausible. Will keep antibiotics on for now. There was some concern that pt had been on daptomycin outpatient and had remained afebrile,  believe this is only co-incidental. Given 16s was negative, don't feel strongly to switch to daptomycin.  ?-Autoimmune work-up pending ?-Blood Cx remain negative ? ? ? ? ?Microbiology:   ?Antibiotics: ?Daptomycin and ctx complete 4/6 ? Vancomcyin cefepime and metronidazole 4/13-p ?Cultures: ?Blood ?4/13 NG ? ? ?SUBJECTIVE: ?Resting in bed. No new complaints ?Interval: ?Tmax of 103.2 overnight.  ?Review of Systems: ?ROS ? ? ?Scheduled Meds: ? aspirin EC  81 mg Oral Daily  ? heparin  5,000 Units Subcutaneous Q8H  ? metoCLOPramide  5 mg Oral TID AC  ? potassium chloride  40 mEq Oral Daily  ? sodium chloride flush  3 mL Intravenous Q12H  ? ?Continuous Infusions: ? sodium chloride 10 mL/hr at 08/25/21 0149  ? cefTRIAXone (ROCEPHIN)  IV 2 g (08/25/21 2201)  ?  lactated ringers 150 mL/hr at 08/25/21 2125  ? metronidazole 500 mg (08/26/21 1158)  ? vancomycin 1,250 mg (08/26/21 0550)  ? ?PRN Meds:.sodium chloride, acetaminophen **OR** acetaminophen, melatonin, ondansetron **OR** ondansetron (ZOFRAN) IV, phenol ?Allergies  ?Allergen Reactions  ? Eggs Or Egg-Derived Products   ?  "eggs"  ? Peanut-Containing Drug Products   ?  "peanuts"  ? ? ?OBJECTIVE: ?Vitals:  ? 08/26/21 1139 08/26/21 1145 08/26/21 1155  08/26/21 1233  ?BP: (!) 167/68  (!) 156/97 (!) 165/90  ?Pulse: (!) 113  86 66  ?Resp: (!) 32  (!) 21 (!) 22  ?Temp: 99.1 ?F (37.3 ?C) (!) 100.9 ?F (38.3 ?C)    ?TempSrc: Oral Rectal    ?SpO2: 100%  93% 99%  ?Weight:      ?Height:      ? ?Body mass index is 29.94 kg/m?. ? ?Physical Exam ? ? ? ?Lab Results ?Lab Results  ?Component Value Date  ? WBC 11.9 (H) 08/25/2021  ? HGB 8.8 (L) 08/25/2021  ? HCT 27.6 (L) 08/25/2021  ? MCV 74.8 (L) 08/25/2021  ? PLT 231 08/25/2021  ?  ?Lab Results  ?Component Value Date  ? CREATININE 0.81 08/25/2021  ? BUN <5 (L) 08/25/2021  ? NA 131 (L) 08/25/2021  ? K 3.3 (L) 08/25/2021  ? CL 96 (L) 08/25/2021  ? CO2 23 08/25/2021  ?  ?Lab Results  ?Component Value Date  ? ALT 10 08/25/2021  ? AST 10 (L) 08/25/2021  ? ALKPHOS 50 08/25/2021  ? BILITOT 0.8 08/25/2021  ?  ? ? ? ? ?Laurice Record, MD ?[facility] ?[facility] ?08/26/2021, 12:59 PM  ?

## 2021-08-26 NOTE — Progress Notes (Signed)
Called by RN and MD to come assess pt. Pt is clearly uncomfortable, shivering in bed and breathing in the low 30s. No accessory muscle use noted. Pt is on 2 L JAARS sating appropriately. Pt has diminished BS throughout. Rapid response RN is also at bedside to witness. RT will monitor.  ?

## 2021-08-27 ENCOUNTER — Ambulatory Visit: Payer: BC Managed Care – PPO | Admitting: Internal Medicine

## 2021-08-27 DIAGNOSIS — I5082 Biventricular heart failure: Secondary | ICD-10-CM | POA: Diagnosis not present

## 2021-08-27 DIAGNOSIS — D649 Anemia, unspecified: Secondary | ICD-10-CM | POA: Diagnosis not present

## 2021-08-27 DIAGNOSIS — I719 Aortic aneurysm of unspecified site, without rupture: Secondary | ICD-10-CM | POA: Diagnosis not present

## 2021-08-27 DIAGNOSIS — N170 Acute kidney failure with tubular necrosis: Secondary | ICD-10-CM | POA: Diagnosis not present

## 2021-08-27 DIAGNOSIS — Z9281 Personal history of extracorporeal membrane oxygenation (ECMO): Secondary | ICD-10-CM | POA: Diagnosis not present

## 2021-08-27 DIAGNOSIS — J811 Chronic pulmonary edema: Secondary | ICD-10-CM | POA: Diagnosis not present

## 2021-08-27 DIAGNOSIS — Z953 Presence of xenogenic heart valve: Secondary | ICD-10-CM | POA: Diagnosis not present

## 2021-08-27 DIAGNOSIS — J9811 Atelectasis: Secondary | ICD-10-CM | POA: Diagnosis not present

## 2021-08-27 DIAGNOSIS — R110 Nausea: Secondary | ICD-10-CM | POA: Diagnosis not present

## 2021-08-27 DIAGNOSIS — Z66 Do not resuscitate: Secondary | ICD-10-CM | POA: Diagnosis not present

## 2021-08-27 DIAGNOSIS — R509 Fever, unspecified: Secondary | ICD-10-CM | POA: Diagnosis not present

## 2021-08-27 DIAGNOSIS — R112 Nausea with vomiting, unspecified: Secondary | ICD-10-CM | POA: Diagnosis not present

## 2021-08-27 DIAGNOSIS — J95821 Acute postprocedural respiratory failure: Secondary | ICD-10-CM | POA: Diagnosis not present

## 2021-08-27 DIAGNOSIS — G8918 Other acute postprocedural pain: Secondary | ICD-10-CM | POA: Diagnosis not present

## 2021-08-27 DIAGNOSIS — J9819 Other pulmonary collapse: Secondary | ICD-10-CM | POA: Diagnosis not present

## 2021-08-27 DIAGNOSIS — I82452 Acute embolism and thrombosis of left peroneal vein: Secondary | ICD-10-CM | POA: Diagnosis not present

## 2021-08-27 DIAGNOSIS — A419 Sepsis, unspecified organism: Secondary | ICD-10-CM | POA: Diagnosis not present

## 2021-08-27 DIAGNOSIS — R768 Other specified abnormal immunological findings in serum: Secondary | ICD-10-CM | POA: Diagnosis not present

## 2021-08-27 DIAGNOSIS — R570 Cardiogenic shock: Secondary | ICD-10-CM | POA: Diagnosis not present

## 2021-08-27 DIAGNOSIS — Z4659 Encounter for fitting and adjustment of other gastrointestinal appliance and device: Secondary | ICD-10-CM | POA: Diagnosis not present

## 2021-08-27 DIAGNOSIS — J9601 Acute respiratory failure with hypoxia: Secondary | ICD-10-CM | POA: Diagnosis not present

## 2021-08-27 DIAGNOSIS — E8770 Fluid overload, unspecified: Secondary | ICD-10-CM | POA: Diagnosis not present

## 2021-08-27 DIAGNOSIS — R000 Tachycardia, unspecified: Secondary | ICD-10-CM | POA: Diagnosis not present

## 2021-08-27 DIAGNOSIS — R6521 Severe sepsis with septic shock: Secondary | ICD-10-CM | POA: Diagnosis not present

## 2021-08-27 DIAGNOSIS — K7200 Acute and subacute hepatic failure without coma: Secondary | ICD-10-CM | POA: Diagnosis not present

## 2021-08-27 DIAGNOSIS — R5081 Fever presenting with conditions classified elsewhere: Secondary | ICD-10-CM | POA: Diagnosis not present

## 2021-08-27 DIAGNOSIS — I38 Endocarditis, valve unspecified: Secondary | ICD-10-CM | POA: Diagnosis not present

## 2021-08-27 DIAGNOSIS — I330 Acute and subacute infective endocarditis: Secondary | ICD-10-CM | POA: Diagnosis not present

## 2021-08-27 DIAGNOSIS — I471 Supraventricular tachycardia: Secondary | ICD-10-CM | POA: Diagnosis not present

## 2021-08-27 DIAGNOSIS — Z9889 Other specified postprocedural states: Secondary | ICD-10-CM | POA: Diagnosis not present

## 2021-08-27 DIAGNOSIS — G9389 Other specified disorders of brain: Secondary | ICD-10-CM | POA: Diagnosis not present

## 2021-08-27 DIAGNOSIS — R9431 Abnormal electrocardiogram [ECG] [EKG]: Secondary | ICD-10-CM | POA: Diagnosis not present

## 2021-08-27 DIAGNOSIS — R4182 Altered mental status, unspecified: Secondary | ICD-10-CM | POA: Diagnosis not present

## 2021-08-27 DIAGNOSIS — R6510 Systemic inflammatory response syndrome (SIRS) of non-infectious origin without acute organ dysfunction: Secondary | ICD-10-CM | POA: Diagnosis not present

## 2021-08-27 DIAGNOSIS — I351 Nonrheumatic aortic (valve) insufficiency: Secondary | ICD-10-CM | POA: Diagnosis not present

## 2021-08-27 DIAGNOSIS — K559 Vascular disorder of intestine, unspecified: Secondary | ICD-10-CM | POA: Diagnosis not present

## 2021-08-27 DIAGNOSIS — E871 Hypo-osmolality and hyponatremia: Secondary | ICD-10-CM | POA: Diagnosis not present

## 2021-08-27 DIAGNOSIS — J90 Pleural effusion, not elsewhere classified: Secondary | ICD-10-CM | POA: Diagnosis not present

## 2021-08-27 DIAGNOSIS — Z4682 Encounter for fitting and adjustment of non-vascular catheter: Secondary | ICD-10-CM | POA: Diagnosis not present

## 2021-08-27 DIAGNOSIS — R918 Other nonspecific abnormal finding of lung field: Secondary | ICD-10-CM | POA: Diagnosis not present

## 2021-08-27 DIAGNOSIS — R9389 Abnormal findings on diagnostic imaging of other specified body structures: Secondary | ICD-10-CM | POA: Diagnosis not present

## 2021-08-27 DIAGNOSIS — E872 Acidosis, unspecified: Secondary | ICD-10-CM | POA: Diagnosis not present

## 2021-08-27 DIAGNOSIS — I639 Cerebral infarction, unspecified: Secondary | ICD-10-CM | POA: Diagnosis not present

## 2021-08-27 DIAGNOSIS — D62 Acute posthemorrhagic anemia: Secondary | ICD-10-CM | POA: Diagnosis not present

## 2021-08-27 DIAGNOSIS — I359 Nonrheumatic aortic valve disorder, unspecified: Secondary | ICD-10-CM | POA: Diagnosis not present

## 2021-08-27 DIAGNOSIS — R578 Other shock: Secondary | ICD-10-CM | POA: Diagnosis not present

## 2021-08-27 DIAGNOSIS — T82310A Breakdown (mechanical) of aortic (bifurcation) graft (replacement), initial encounter: Secondary | ICD-10-CM | POA: Diagnosis not present

## 2021-08-27 DIAGNOSIS — D72829 Elevated white blood cell count, unspecified: Secondary | ICD-10-CM | POA: Diagnosis not present

## 2021-08-27 DIAGNOSIS — I517 Cardiomegaly: Secondary | ICD-10-CM | POA: Diagnosis not present

## 2021-08-27 DIAGNOSIS — Z452 Encounter for adjustment and management of vascular access device: Secondary | ICD-10-CM | POA: Diagnosis not present

## 2021-08-27 DIAGNOSIS — D684 Acquired coagulation factor deficiency: Secondary | ICD-10-CM | POA: Diagnosis not present

## 2021-08-27 DIAGNOSIS — Z515 Encounter for palliative care: Secondary | ICD-10-CM | POA: Diagnosis not present

## 2021-08-27 DIAGNOSIS — Z952 Presence of prosthetic heart valve: Secondary | ICD-10-CM | POA: Diagnosis not present

## 2021-08-27 DIAGNOSIS — E875 Hyperkalemia: Secondary | ICD-10-CM | POA: Diagnosis not present

## 2021-08-27 DIAGNOSIS — N179 Acute kidney failure, unspecified: Secondary | ICD-10-CM | POA: Diagnosis not present

## 2021-08-27 DIAGNOSIS — I358 Other nonrheumatic aortic valve disorders: Secondary | ICD-10-CM | POA: Diagnosis not present

## 2021-08-27 DIAGNOSIS — N19 Unspecified kidney failure: Secondary | ICD-10-CM | POA: Diagnosis not present

## 2021-08-27 DIAGNOSIS — I7121 Aneurysm of the ascending aorta, without rupture: Secondary | ICD-10-CM | POA: Diagnosis not present

## 2021-08-27 LAB — COMPREHENSIVE METABOLIC PANEL
ALT: 13 U/L (ref 0–44)
AST: 14 U/L — ABNORMAL LOW (ref 15–41)
Albumin: 2.3 g/dL — ABNORMAL LOW (ref 3.5–5.0)
Alkaline Phosphatase: 51 U/L (ref 38–126)
Anion gap: 10 (ref 5–15)
BUN: 8 mg/dL (ref 6–20)
CO2: 24 mmol/L (ref 22–32)
Calcium: 8 mg/dL — ABNORMAL LOW (ref 8.9–10.3)
Chloride: 99 mmol/L (ref 98–111)
Creatinine, Ser: 0.62 mg/dL (ref 0.61–1.24)
GFR, Estimated: >60 mL/min (ref >60)
Glucose, Bld: 101 mg/dL — ABNORMAL HIGH (ref 70–99)
Potassium: 3.1 mmol/L — ABNORMAL LOW (ref 3.5–5.1)
Sodium: 133 mmol/L — ABNORMAL LOW (ref 135–145)
Total Bilirubin: 0.8 mg/dL (ref 0.3–1.2)
Total Protein: 5.9 g/dL — ABNORMAL LOW (ref 6.5–8.1)

## 2021-08-27 LAB — CULTURE, BLOOD (ROUTINE X 2)
Culture: NO GROWTH
Culture: NO GROWTH
Special Requests: ADEQUATE
Special Requests: ADEQUATE

## 2021-08-27 LAB — CBC
HCT: 24.4 % — ABNORMAL LOW (ref 39.0–52.0)
Hemoglobin: 7.8 g/dL — ABNORMAL LOW (ref 13.0–17.0)
MCH: 23.7 pg — ABNORMAL LOW (ref 26.0–34.0)
MCHC: 32 g/dL (ref 30.0–36.0)
MCV: 74.2 fL — ABNORMAL LOW (ref 80.0–100.0)
Platelets: 195 10*3/uL (ref 150–400)
RBC: 3.29 MIL/uL — ABNORMAL LOW (ref 4.22–5.81)
RDW: 15.2 % (ref 11.5–15.5)
WBC: 16.2 10*3/uL — ABNORMAL HIGH (ref 4.0–10.5)
nRBC: 0 % (ref 0.0–0.2)

## 2021-08-27 LAB — ANA W/REFLEX IF POSITIVE: Anti Nuclear Antibody (ANA): NEGATIVE

## 2021-08-27 LAB — RHEUMATOID FACTOR: Rheumatoid fact SerPl-aCnc: 12.6 IU/mL (ref <14.0)

## 2021-08-27 LAB — CK: Total CK: 83 U/L (ref 49–397)

## 2021-08-27 MED ORDER — NAPROXEN 375 MG PO TABS: 375.0000 mg | ORAL_TABLET | Freq: Two times a day (BID) | ORAL | Status: AC

## 2021-08-27 MED ORDER — METOCLOPRAMIDE HCL 5 MG PO TABS: 5.0000 mg | ORAL_TABLET | Freq: Three times a day (TID) | ORAL | Status: AC

## 2021-08-27 MED ORDER — METRONIDAZOLE 500 MG/100ML IV SOLN: 500.0000 mg | Freq: Two times a day (BID) | INTRAVENOUS | Status: AC

## 2021-08-27 MED ORDER — LACTATED RINGERS IV SOLN: 1.0000 mL | INTRAVENOUS | 0 refills | Status: AC

## 2021-08-27 MED ORDER — SODIUM CHLORIDE 0.9 % IV SOLN: 900.0000 mg | Freq: Every day | INTRAVENOUS | Status: AC

## 2021-08-27 MED ORDER — SODIUM CHLORIDE 0.9 % IV SOLN: 2.0000 g | INTRAVENOUS | Status: AC

## 2021-08-27 NOTE — Progress Notes (Signed)
Pt discharged with [facility] Belongings in room sent with pt. Packet of information sent with transporters. Night RN called report to receiving staff.  ?

## 2021-08-27 NOTE — Progress Notes (Incomplete)
?   ? ? ? ? ?[facility] ? ?Date of Admission:  08/22/2021   Total days of inpatient antibiotics 3 ? ?Principal Problem: ?  SIRS (systemic inflammatory response syndrome) (HCC) ?Active Problems: ?  History of excision of pilonidal cyst ?  Hx of repair of aortic root ?  Hypokalemia ?  Infective endocarditis ?     ?    ?Assessment: ?76 YM with presented with N/V and fever  admitted with arotic root abscess. Wbc 14k on admission with temp of 102.1 during hospitalization. ?  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 [facility] PCR was sent but bacterial sequence was unable to be run. Followed by ID(Dr. Luciana Axe) pt complete dapto+ ctx on 4/6(6 weeks) ?-TTE on 4/10 showed EF mildly reduced and LV dilated compared to previous TEE.  ?-He developed  chills , chest discomfort on 4/9 followed by diarrhea(PICC line pulled 4/10). Seen by Cards on 4/13 and sent to ED for evaluation due ro concern for sepsis and recurrent endocarditis ?-TEE  on admission showed large cvity partially filled with debris surrounding AV annulus extending into aortic root c/w large annular aortic root abscess ?-CTS consulted and pt is not a canddiate for reoperative surgical therapay at this time. Noted abnormality seen on echo more likely due to post surgical changes as intial OR cx were sterile.  ?Pt continues to fever on broad spectrum antibiotics. CT AP showed no acute findings in abdomen/pelvis. Soft tissue stranding of mediastinal fat, possible post-op changes. CT chest was unrevealing for alterative infectious source ? ? ?Recommendations:  ?-Continue vancomycin and  ceftriaxone. No need for  daptomycin.  ?-I called and spoke with [facility] regarding PCR results. They CONFIRMED that 16S PCR was ran on tissue sent  and was negative(noted in report). This is what we were testing for. ?As a side note: the report stated bacterial DNA sequence was not done, which is accurate as this test is not done on tissue rather on plated colonies.  ?-Given OR Cx were negative, 16S RNA negative, persistent fever on antibiotics, worsening TEE finding after 6 weeks of antibiotics , path from OR showed leaflet fibrosis and aortic tissue showed inflammation in adventitia: I think aortitis /vasculitis/autoimmune process is plausible. Will keep antibiotics on for now. -Autoimmune work-up pending ?-Blood Cx remain negative ? ? ? ? ?Microbiology:   ?Antibiotics: ?Daptomycin and ctx complete 4/6 ? Vancomcyin cefepime and metronidazole 4/13-p ?Cultures: ?Blood ?4/13 NG ? ? ?SUBJECTIVE: ?Resting in bed. No new complaints ?Interval: ?Tmax of 103.2 overnight.  ?Review of Systems: ?ROS ? ? ?Scheduled Meds: ? acetaminophen  500 mg Oral Q6H  ? aspirin EC  81 mg Oral Daily  ? heparin  5,000 Units Subcutaneous Q8H  ? menthol-cetylpyridinium  1 lozenge Oral Q4H  ? metoCLOPramide  5 mg Oral TID AC  ? naproxen  375 mg Oral BID WC  ? potassium chloride  40 mEq Oral Daily  ? sodium chloride flush  3 mL Intravenous Q12H  ? ?Continuous Infusions: ? sodium chloride 10 mL/hr at 08/25/21 0149  ? cefTRIAXone (ROCEPHIN)  IV 2 g (08/26/21 2218)  ? DAPTOmycin (CUBICIN)  IV  136 mL/hr at 08/26/21 1728  ? lactated ringers Stopped (08/26/21 1724)  ? metronidazole 500 mg (08/26/21 2316)  ? ?PRN Meds:.sodium chloride, melatonin, ondansetron **OR** ondansetron (ZOFRAN) IV, phenol ?Allergies  ?Allergen Reactions  ? Eggs Or Egg-Derived Products   ?  "eggs"  ? Peanut-Containing Drug Products   ?  "peanuts"  ? ? ?OBJECTIVE: ?Vitals:  ? 08/27/21 0030 08/27/21 0329 08/27/21 0349 08/27/21 0722  ?BP:   109/62 (!) 113/59  ?Pulse:   (!) 106 (!) 103  ?Resp:  19 18 (!) 26   ?Temp: 98.3 ?F (36.8 ?C)  98.2 ?F (36.8 ?C) (!) 97.5 ?F (36.4 ?C)  ?TempSrc: Oral  Oral Oral  ?SpO2:   97% 99%  ?Weight:      ?Height:      ? ?Body mass index is 29.94 kg/m?. ? ?Physical Exam ? ? ? ?Lab Results ?Lab Results  ?Component Value Date  ? WBC 16.2 (H) 08/27/2021  ? HGB 7.8 (L) 08/27/2021  ? HCT 24.4 (L) 08/27/2021  ? MCV 74.2 (L) 08/27/2021  ? PLT 195 08/27/2021  ?  ?Lab Results  ?Component Value Date  ? CREATININE 0.62 08/27/2021  ? BUN 8 08/27/2021  ? NA 133 (L) 08/27/2021  ? K 3.1 (L) 08/27/2021  ? CL 99 08/27/2021  ? CO2 24 08/27/2021  ?  ?Lab Results  ?Component Value Date  ? ALT 13 08/27/2021  ? AST 14 (L) 08/27/2021  ? ALKPHOS 51 08/27/2021  ? BILITOT 0.8 08/27/2021  ?  ? ? ? ? ?Danelle Earthly, MD ?[facility] for Infectious Disease ?[facility] ?08/27/2021, 8:50 AM  ?

## 2021-08-27 NOTE — Discharge Summary (Addendum)
Physician Discharge Summary  ?Martin Stafford QPR:[id number] DOB: 07/22/1990 DOA: 08/22/2021 ? ?PCP: Patient, No Pcp Per (Inactive) ? ?Admit date: 08/22/2021 ?Discharge date: 08/27/2021 ? ?Time spent: 37 minutes ? ?Recommendations for Outpatient Follow-up:  ?Patient accepted in transfer under Hospitalist Dr. Roylene Reason for subspecialty Rheumatology ad Cardio-thoracic care at [facility] ?See brief history below ? ?Discharge Diagnoses:  ?MAIN problem for hospitalization  ? 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 ?Please see below for itemized issues addressed in HOpsital- ?refer to other progress notes for clarity if needed ? ?Discharge Condition: fair ? ?Diet recommendation: heart healthy ? ?Filed Weights  ? 08/22/21 1722 08/23/21 0955  ?Weight: 101.2 kg 97.4 kg  ? ? 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 Madlyn Frankel on daptomycin and ceftriaxone and completed the same 08/13/2021 under guidance of ID ?Follow-up TTE 08/19/2021 = hypokinesis AV regurg the source was felt to be a pilonidal cyst ? ?Developed chills malaise chest discomfort nausea vomiting since 4/9 sternotomy site has healed no erythema ?Evaluated in cardiology clinic 4/13 and concerns for worsening aortic insufficiency sepsis criteria met on admission with tachycardia chest pain and was febrile and sent to the emergency room ?Found to be in sinus tach at 123 WBC 14 platelet 302 INR 1.4 Tmax 101.2 ?  ?Cardiology evaluated did stat TTE ?TEE performed 4/14 confirms abnormalities including large cavity with debris + severe AR +?  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 Darcey Nora and there is no current surgical role for management as the risks outweigh the benefits of the same at this time ?Current thinking from CVTS is that these changes represent post-operative changes--not infection ? ?ID input , Cardiothoracic input as well as Cardiology input  was sought. ? 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 ?[employer] was contacted.  Patient was transferred to [facility]  ? 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 Luvenia Redden was confirmed by Dr. Leanora Cover of ID ?Preliminary blood cultures X2 on 4/13 = NGTD--recurrent cultures 4/15 no growth to date ?Scheduled Tylenol 500 every 6-low-dose Naprosyn 375 twice daily as well ?Patient remain on Daptomycin/Ceftriaxone as well as Flagyll---these can be de-escalated based on culture data and or clinical condition within 24 hours ??  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 as per infectious disease input ?Rheumatology at [facility] will hopefully be able to offer possible answers to this concern--patient is being transferred to [facility] ?Prior pilonidal cyst status post resection 07/03/2021 Dr. Coralie Keens [gen surgery] ?CT abdomen pelvis rules out infection 4/15 ?Current antibiotics per infectious disease = daptomycin ceftriaxone Flagyl ?Sinus tachycardia ?Secondary to infectious etiology probably in addition to altered dynamics with regards to aortic valve ?Beta-blocker resumed at lower dose to prevent altered hemodynamics/concern for sepsis--may need to escalate to home dosing in 24 hours ?Advance fluids to 75 cc/H [down from 150] ?Hypokalemia on admission ?Recheck  labs in the morning recheck labs in a.m. ?Likely anemia  chronic disease ? recheck labs in the morning ? ? ? ?Discharge Exam: ?Vitals:  ? 08/26/21 2337 08/27/21 0030  ?BP: 117/76   ?Pulse: (!) 127   ?Resp: (!) 21   ?Temp: (!) 102.2 ?F (39 ?C) 98.3 ?F (36.8 ?C)  ?SpO2: 94%   ? ? ?Subj on day of d/c ?  ?Not seen this early am ? ?General Exam on discharge ? ?Not seen ? ?Discharge Instructions ? ? ?Discharge Instructions   ? ? Diet - low sodium heart healthy   Complete by: As directed ?  ? Discharge wound care:   Complete by: As directed ?  ? Wet-dry to sacral wound  ? Increase activity slowly   Complete by: As directed ?  ? ?  ? ?Allergies as of 08/27/2021   ? ?   Reactions  ? Eggs Or Egg-derived Products   ? "eggs"  ? Peanut-containing Drug Products   ? "peanuts"  ? ?  ? ?  ?Medication List  ?  ? ?STOP taking these medications   ? ?amoxicillin 500 MG tablet ?Commonly known as: AMOXIL ?  ?losartan 25 MG tablet ?Commonly known as: COZAAR ?  ?metoprolol succinate 100 MG 24 hr tablet ?Commonly known as: TOPROL-XL ?  ?pantoprazole 20 MG tablet ?Commonly known as: PROTONIX ?  ? ?  ? ?TAKE these medications   ? ?acetaminophen 325 MG tablet ?Commonly known as: TYLENOL ?Take 650 mg by mouth every 6 (six) hours as needed for mild pain or headache. ?  ?aspirin 81 MG EC tablet ?Take 1 tablet (81 mg total) by mouth daily. Swallow whole. ?  ?cefTRIAXone 2 g in sodium chloride 0.9 % 100 mL ?Inject 2 g into the vein daily. ?  ?DAPTOmycin 900 mg in sodium chloride 0.9 % 50 mL ?Inject 900 mg into the vein daily at 8 pm. ?  ?lactated ringers infusion ?Inject 1 mL into the vein continuous. ?  ?metoCLOPramide 5 MG tablet ?Commonly known as: REGLAN ?Take 1 tablet (5 mg total) by mouth 3 (three) times daily before meals. ?  ?metroNIDAZOLE 500 MG/100ML ?Commonly known as: FLAGYL ?Inject 100 mLs (500 mg total) into the vein every 12 (twelve) hours. ?  ?naproxen 375 MG tablet ?Commonly known as: NAPROSYN ?Take 1 tablet (375 mg total) by mouth 2  (two) times daily with a meal. ?  ? ?  ? ?  ?  ? ? ?  ?Discharge Care Instructions  ?(From admission, onward)  ?  ? ? ?  ? ?  Start     Ordered  ? 08/27/21 0000  Discharge wound care:       ?Comments: Wet-dry to sacral wound  ? 08/27/21 0314  ? ?  ?  ? ?  ? ?Allergies  ?Allergen Reactions  ? Eggs Or Egg-Derived Products   ?  "eggs"  ? Peanut-Containing Drug Products   ?  "peanuts"  ? ? ? ? ?The results of significant diagnostics from this hospitalization (including imaging, microbiology, ancillary and laboratory) are listed below for reference.   ? ?Significant Diagnostic Studies: ?DG Chest 2 View ? ?Result Date: 08/22/2021 ?CLINICAL DATA:  Suspected sepsis EXAM: CHEST - 2 VIEW COMPARISON:  07/24/2021, 07/12/2021 FINDINGS: Mild left lower lobe airspace disease with partial clearing since the prior studies. No significant pleural effusion. Right lung clear Median sternotomy.  Heart size upper normal.  Vascularity normal. IMPRESSION: Slowly improving left lower lobe infiltrate likely due to resolving pneumonia. No new finding. Electronically Signed   By: Juanda Crumble  Carlis Abbott M.D.   On: 08/22/2021 18:22  ? ?CT CHEST W CONTRAST ? ?Result Date: 08/25/2021 ?CLINICAL DATA:  Endocarditis.  Pericardial effusion. EXAM: CT CHEST WITH CONTRAST TECHNIQUE: Multidetector CT imaging of the chest was performed during intravenous contrast administration. RADIATION DOSE REDUCTION: This exam was performed according to the departmental dose-optimization program which includes automated exposure control, adjustment of the mA and/or kV according to patient size and/or use of iterative reconstruction technique. CONTRAST:  162m OMNIPAQUE IOHEXOL 300 MG/ML  SOLN COMPARISON:  06/27/2021 FINDINGS: Cardiovascular: Patient is undergone aortic root replacement since prior study. Moderate anterior mediastinal hematoma is seen with small amount of fluid or blood along the right heart border, but no significant pericardial effusion. Stable mild  cardiomegaly and left ventricular hypertrophy. Mediastinum/Nodes: No masses or pathologically enlarged lymph nodes identified. Lungs/Pleura: New small left pleural effusion and left lower lobe atelectasis. Mild ate

## 2021-08-28 LAB — ANTI-DNASE B ANTIBODY: Anti-DNAse-B: 99 U/mL (ref 0–120)

## 2021-08-29 LAB — CULTURE, BLOOD (ROUTINE X 2)
Culture: NO GROWTH
Culture: NO GROWTH
Special Requests: ADEQUATE
Special Requests: ADEQUATE

## 2021-09-06 DIAGNOSIS — I358 Other nonrheumatic aortic valve disorders: Secondary | ICD-10-CM | POA: Diagnosis not present

## 2021-09-06 DIAGNOSIS — R570 Cardiogenic shock: Secondary | ICD-10-CM | POA: Diagnosis not present

## 2021-09-09 DEATH — deceased

## 2021-09-16 ENCOUNTER — Ambulatory Visit: Payer: BC Managed Care – PPO | Admitting: Cardiothoracic Surgery

## 2021-11-01 ENCOUNTER — Ambulatory Visit: Payer: BC Managed Care – PPO | Admitting: Nurse Practitioner

## 2021-12-13 ENCOUNTER — Other Ambulatory Visit: Payer: Self-pay | Admitting: Cardiothoracic Surgery

## 2021-12-13 DIAGNOSIS — Z952 Presence of prosthetic heart valve: Secondary | ICD-10-CM

## 2022-01-22 NOTE — Progress Notes (Deleted)
[address]       [place] [address]             [phone]        PCP is Patient, No Pcp Per Referring Provider is No ref. provider found  HPI: This is a 31 year old male with a past medical history of hypertension, diabetes, and sever AI, endocarditis, and sub annular abscess who underwent a Homograft aortic root replacement (size 25 mm) by Dr. Donata Clay on [phone]. He was treated with Rocephin and Daptomcyin. He was last seen in the office Opn 07/24/2021 and was recovering well from surgery. He presents today for CTA.  Past Medical History:  Diagnosis Date   Environmental allergies    Generalized skin cysts     Past Surgical History:  Procedure Laterality Date   ASCENDING AORTIC ROOT REPLACEMENT N/A 07/05/2021   Procedure: Homograft aortic root replacement using 69mm Cryo Aortic Valve.;  Surgeon: Lovett Sox, MD;  Location: [facility] OR;  Service: Open Heart Surgery;  Laterality: N/A;   COLON SURGERY     PILONIDAL CYST DRAINAGE N/A 07/03/2021   Procedure: EXCISIONAL DEBRIDEMENT CHRONIC PILONIDAL TRACT;  Surgeon: Abigail Miyamoto, MD;  Location: [facility] OR;  Service: General;  Laterality: N/A;   TEE WITHOUT CARDIOVERSION N/A 07/01/2021   Procedure: TRANSESOPHAGEAL ECHOCARDIOGRAM (TEE);  Surgeon: Pricilla Riffle, MD;  Location: [facility] ENDOSCOPY;  Service: Cardiovascular;  Laterality: N/A;   TEE WITHOUT CARDIOVERSION N/A 07/05/2021   Procedure: TRANSESOPHAGEAL ECHOCARDIOGRAM (TEE);  Surgeon: Lovett Sox, MD;  Location[facility] OR;  Service: Open Heart Surgery;  Laterality: N/A;   TEE WITHOUT CARDIOVERSION N/A 08/23/2021   Procedure: TRANSESOPHAGEAL ECHOCARDIOGRAM (TEE);  Surgeon: Quintella Reichert, MD;  Location: [facility] ENDOSCOPY;  Service: Cardiovascular;  Laterality: N/A;    Family History  Problem Relation Age of Onset   Hypertension Father    Hypertension Other    Diabetes Other    Asthma Mother     Social History Social History   Tobacco Use   Smoking status:  Never   Smokeless tobacco: Never  Vaping Use   Vaping Use: Never used  Substance Use Topics   Alcohol use: No   Drug use: No    Current Outpatient Medications  Medication Sig Dispense Refill   acetaminophen (TYLENOL) 325 MG tablet Take 650 mg by mouth every 6 (six) hours as needed for mild pain or headache.     aspirin EC 81 MG EC tablet Take 1 tablet (81 mg total) by mouth daily. Swallow whole. 30 tablet 11   cefTRIAXone 2 g in sodium chloride 0.9 % 100 mL Inject 2 g into the vein daily.     DAPTOmycin 900 mg in sodium chloride 0.9 % 50 mL Inject 900 mg into the vein daily at 8 pm.     lactated ringers infusion Inject 1 mL into the vein continuous. 250 mL 0   metoCLOPramide (REGLAN) 5 MG tablet Take 1 tablet (5 mg total) by mouth 3 (three) times daily before meals.     metroNIDAZOLE (FLAGYL) 500 MG/100ML Inject 100 mLs (500 mg total) into the vein every 12 (twelve) hours.     naproxen (NAPROSYN) 375 MG tablet Take 1 tablet (375 mg total) by mouth 2 (two) times daily with a meal.     No current facility-administered medications for this visit.    Allergies  Allergen Reactions   Eggs Or Egg-Derived Products     "eggs"   Peanut-Containing Drug  Products     "peanuts"     Vital Signs:  Physical Exam: CV Pulmonary Abdomen Wounds   Diagnostic Tests:   Impression and Plan:     Ardelle Balls, PA-C [facility] [phone]

## 2022-01-29 ENCOUNTER — Ambulatory Visit: Payer: BC Managed Care – PPO

## 2022-01-29 ENCOUNTER — Inpatient Hospital Stay: Admission: RE | Admit: 2022-01-29 | Payer: BC Managed Care – PPO | Source: Ambulatory Visit

## 2023-04-11 IMAGING — CT CT ANGIO CHEST
2 of 6 series · 17 of 46 positions shown · IV contrast (agent unspecified)
Comparison: Neck CT today.  Chest radiographs 06/24/2020.

CLINICAL DATA: 30-year-old male with sore throat, hoarseness,
fever, chills.

EXAM:
CT ANGIOGRAPHY CHEST WITH CONTRAST
TECHNIQUE: Multidetector CT imaging of the chest was performed using the
standard protocol during bolus administration of intravenous
contrast. Multiplanar CT image reconstructions and MIPs were
obtained to evaluate the vascular anatomy.

[Series 6: thins · axial · 0.92mm/px · z∈[-560,-340]mm · 14 of 242 slices shown]
[im 11/242  lung]
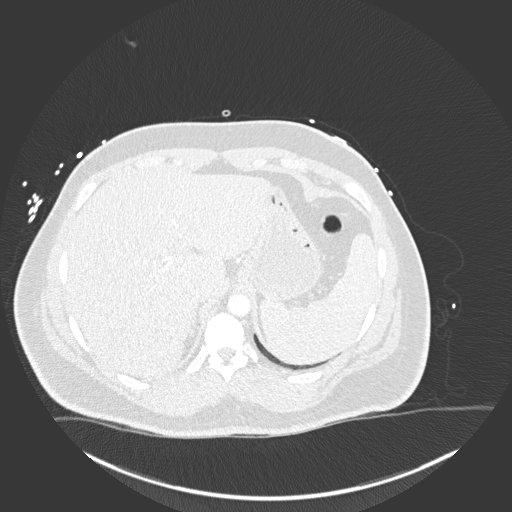
[im 32/242  soft-tissue]
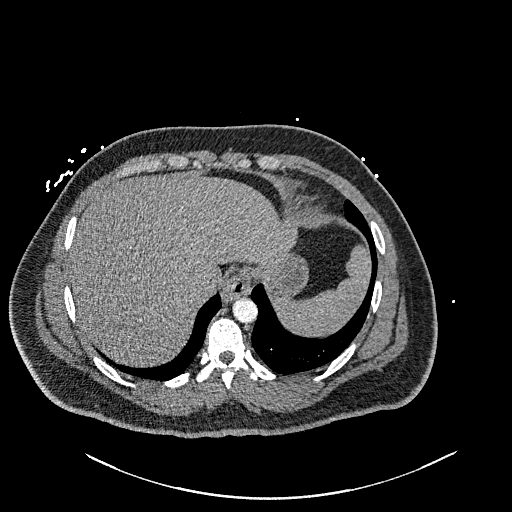
[im 42/242  lung]
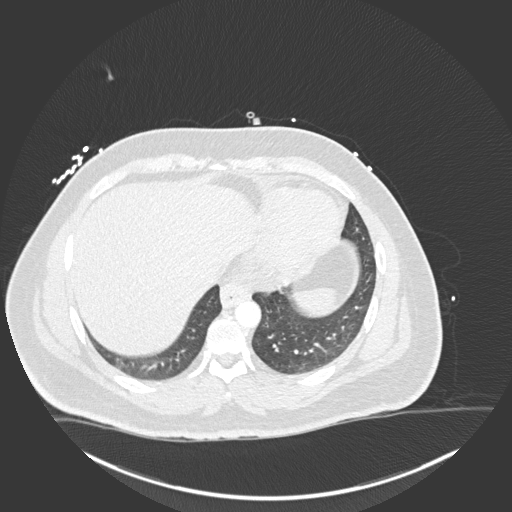
[im 63/242  soft-tissue]
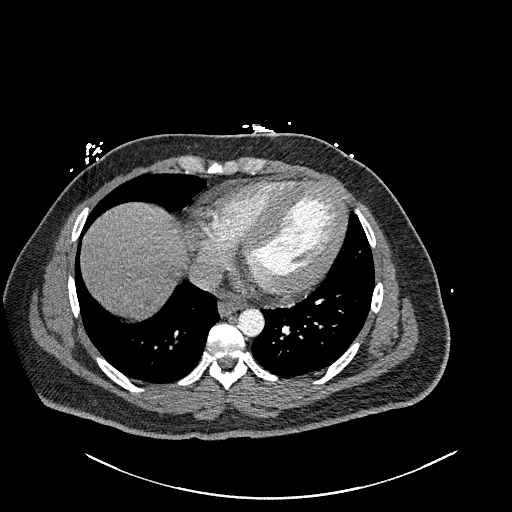
[im 84/242  lung]
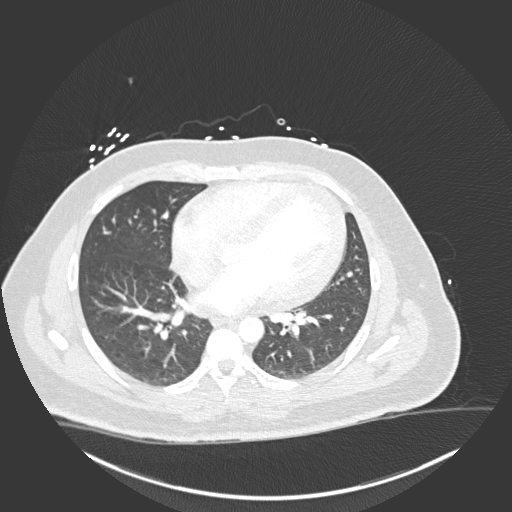
[im 95/242  soft-tissue]
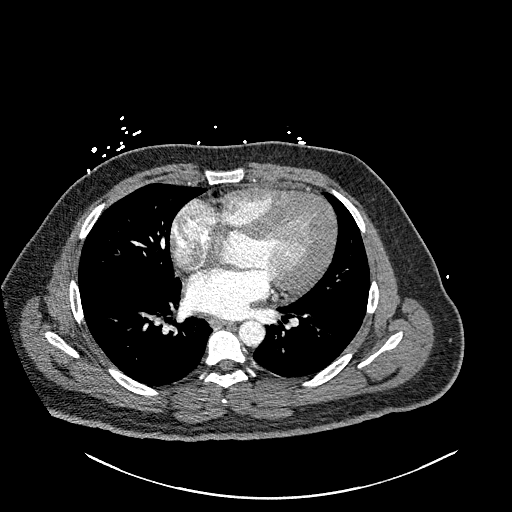
[im 116/242  lung]
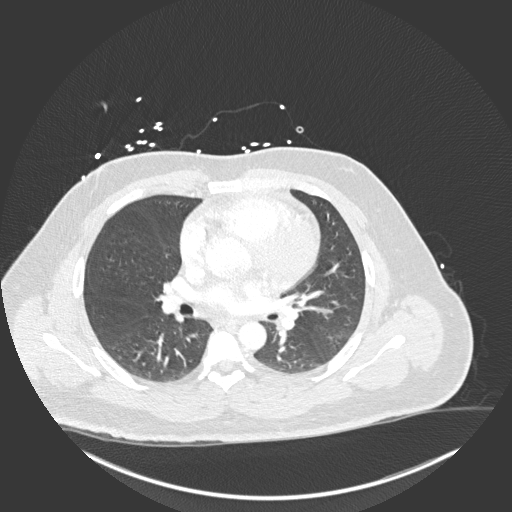
[im 126/242  soft-tissue]
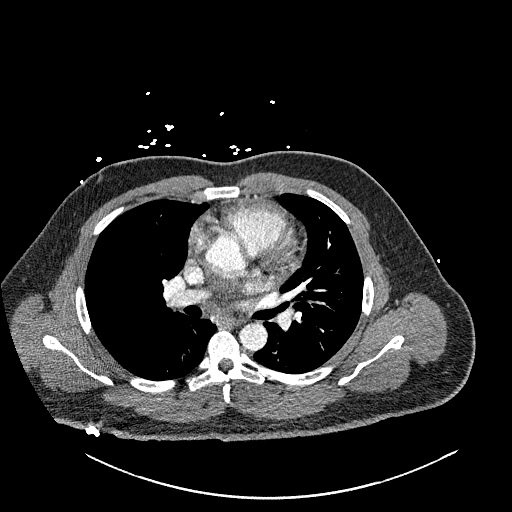
[im 147/242  lung]
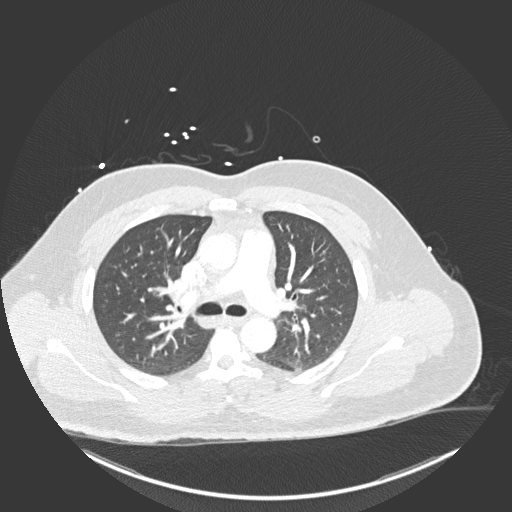
[im 158/242  soft-tissue]
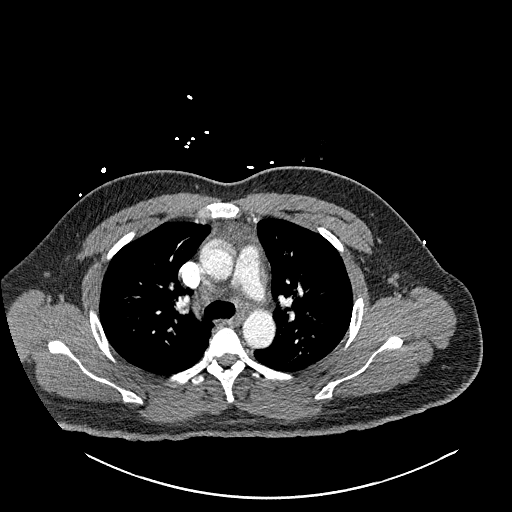
[im 179/242  lung]
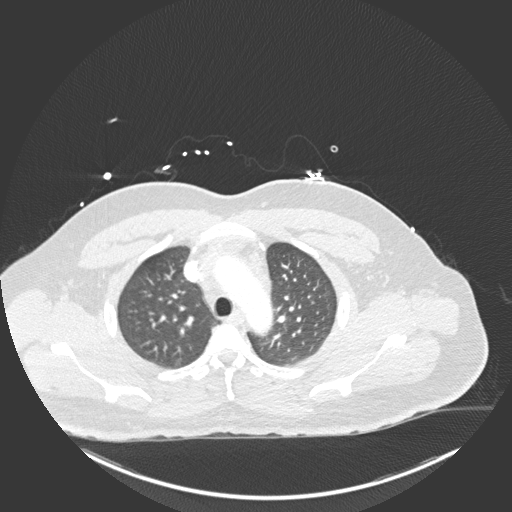
[im 200/242  soft-tissue]
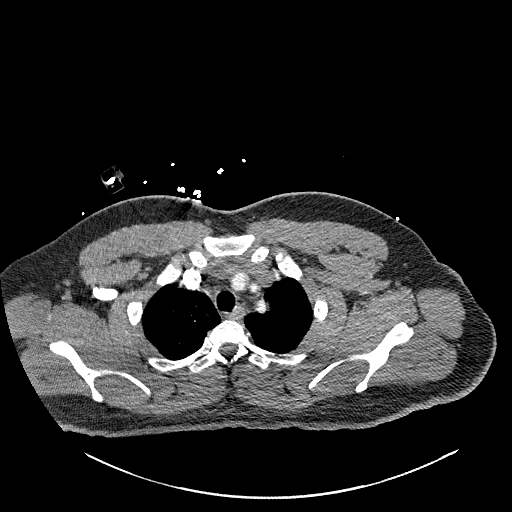
[im 210/242  lung]
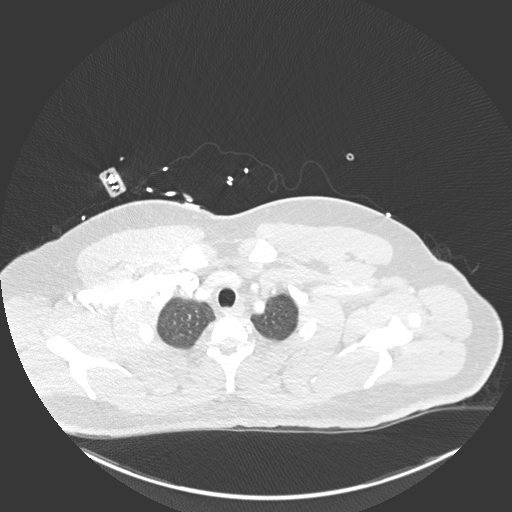
[im 231/242  soft-tissue]
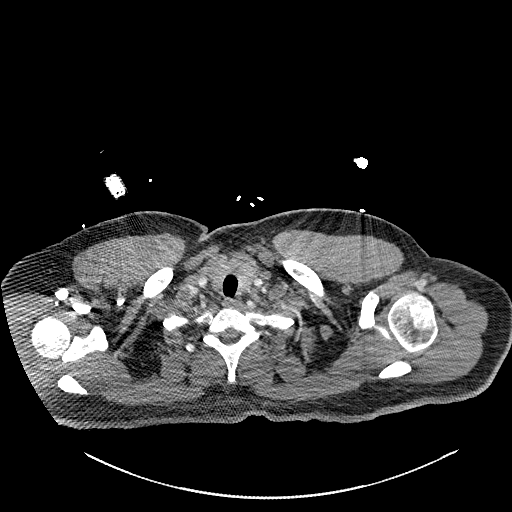

[Series 8: coronal mpr · coronal · 0.59mm/px · 3 of 151 slices shown]
[im 38/151  soft-tissue]
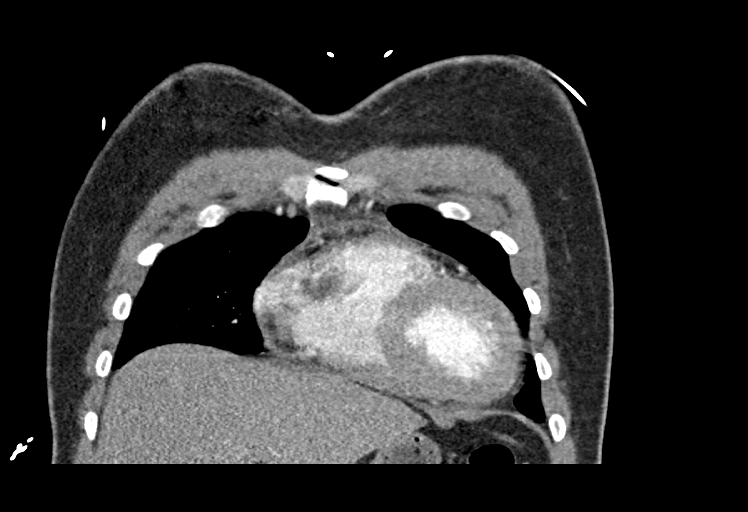
[im 76/151  soft-tissue]
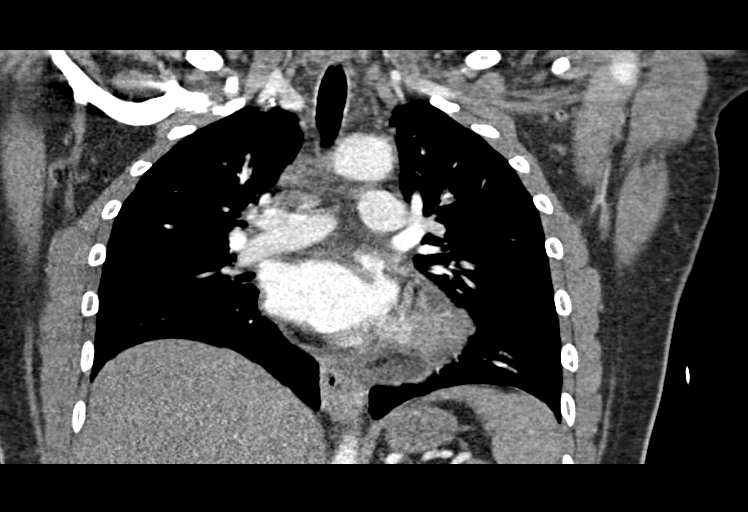
[im 113/151  soft-tissue]
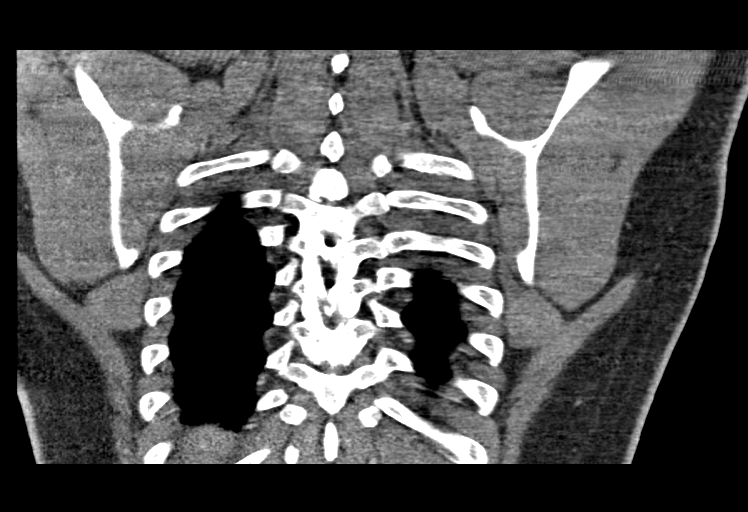

[17 of 46 positions shown; findings below may reference images not displayed]

RADIATION DOSE REDUCTION: This exam was performed according to the
departmental dose-optimization program which includes automated
exposure control, adjustment of the mA and/or kV according to
patient size and/or use of iterative reconstruction technique.

CONTRAST:  100mL OMNIPAQUE IOHEXOL 350 MG/ML SOLN
FINDINGS: Cardiovascular: Suboptimal, but there is adequate opacification of
the central pulmonary arteries. Bilateral subsegmental and distal
branch detail degraded by motion artifact. The central and hilar
pulmonary arteries are enhancing and patent. No convincing pulmonary
artery filling defect elsewhere.

Cardiac size at the upper limits of normal to mildly enlarged. No
pericardial effusion; physiologic appearing fluid in a superior
pericardial recess. Negative visible aorta. Contrast bolus timing in
the pulmonary arterial tree.

Mediastinum/Nodes: Negative. No mediastinal mass or lymphadenopathy.

Lungs/Pleura: Major airways are patent. Mild respiratory motion. But
both lungs appear negative. No pleural effusion. Minimal dependent
atelectasis.

Upper Abdomen: Negative visible liver, spleen, stomach.

Musculoskeletal: Chronic T7-T8 disc degeneration with some vacuum
disc. Otherwise negative.

Review of the MIP images confirms the above findings.
IMPRESSION: 1. Suboptimal evaluation for PE, with subsegmental and distal branch
detail degraded. But no acute pulmonary embolus is identified.

2. Borderline to mild cardiomegaly. Otherwise negative CT appearance
of the Chest.

## 2023-04-19 IMAGING — CR DG CHEST 1V PORT
1 series · 1 of 1 positions shown · non-contrast
Comparison: 07/04/2021

CLINICAL DATA: 30-year-old male with status post aortic root
replacement, assess for pneumothorax.

EXAM:
PORTABLE CHEST - 1 VIEW

[AP]
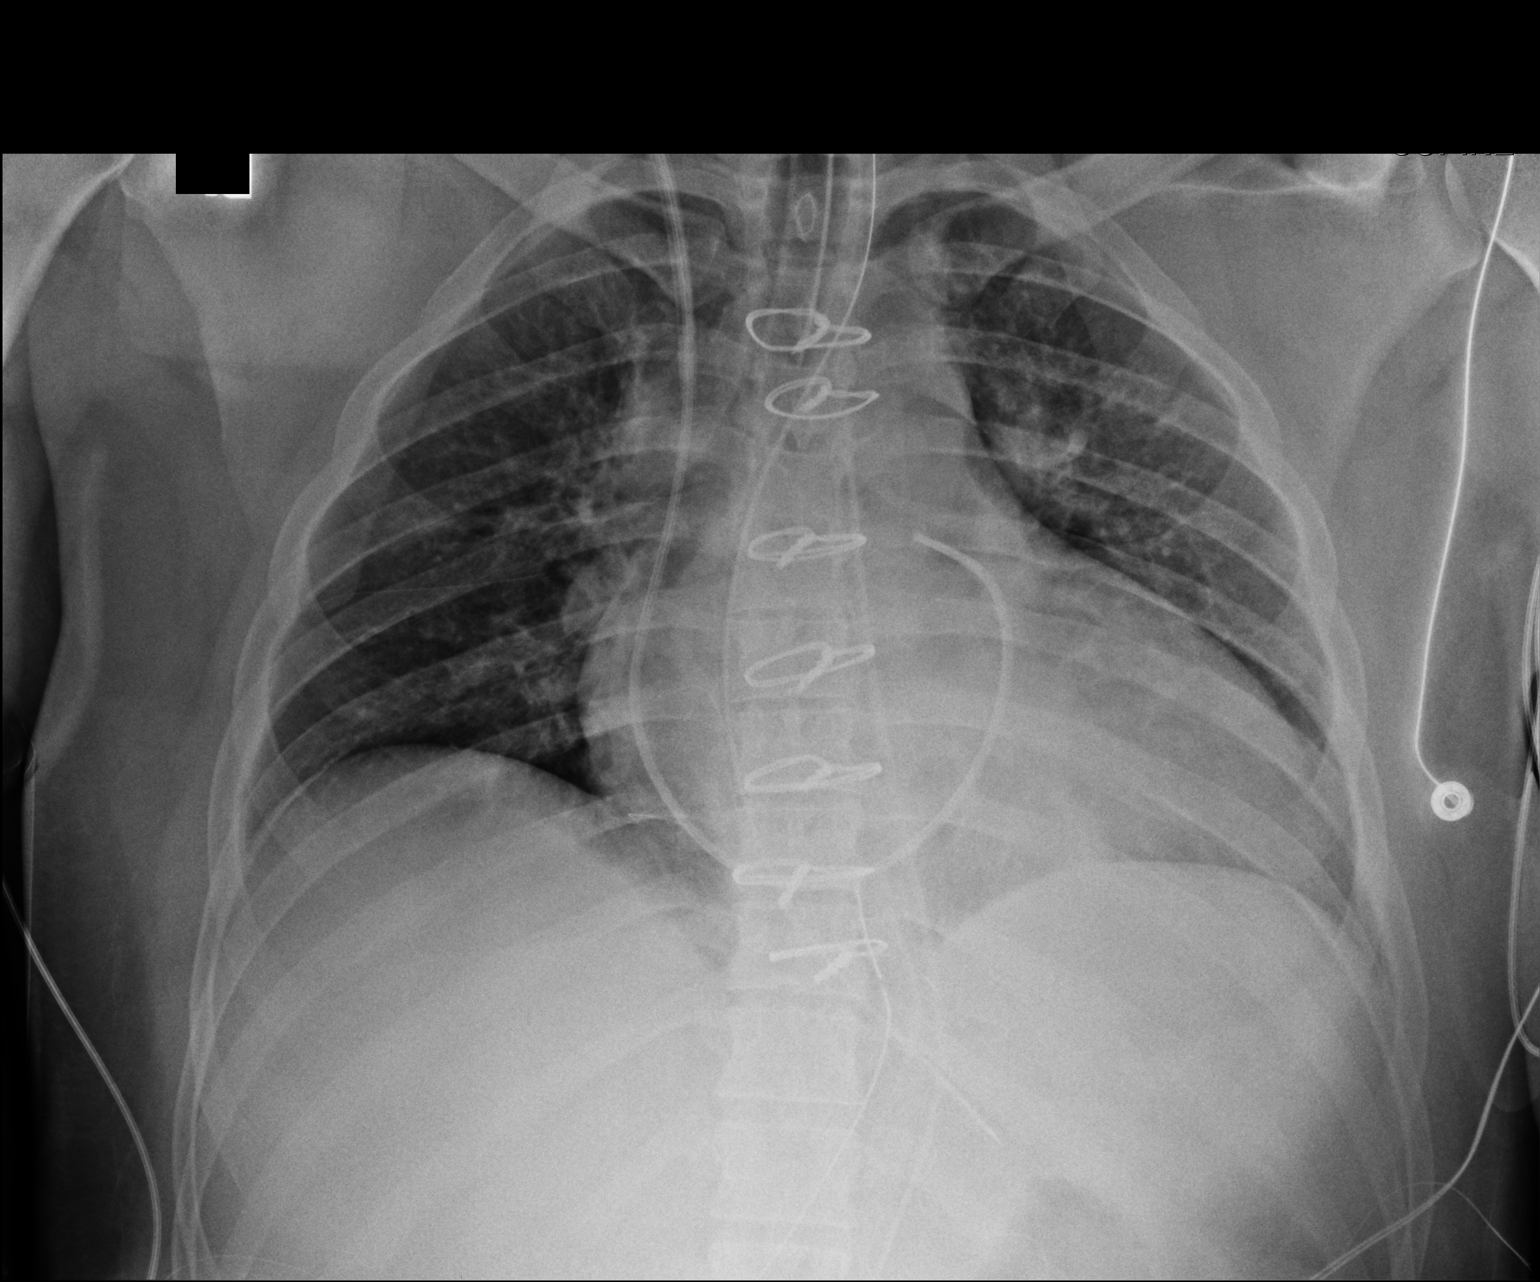

[1 of 1 positions shown; findings below may reference images not displayed]

FINDINGS: The mediastinal contours are within normal limits. Unchanged
cardiomegaly. Right internal jugular central line in place with
pulmonary artery catheter, tip projecting over the main pulmonary
artery. Endotracheal tube in place in the distal thoracic trachea,
tip approximately 1 cm above the carina. Gastric decompression tube
in place with the distal tip just beyond the gastroesophageal
junction. Low lung volumes. No focal consolidations, pleural
effusion, or evidence of pneumothorax. Median sternotomy wires in
place.
IMPRESSION: 1. No evidence of pneumothorax.
2. Endotracheal tube tip positioned approximately 1 cm above the
carina. Recommend retraction by 2-3 cm.
3. Gastric decompression tube tip barely within the stomach.
Recommend advancement by approximately 5-10 cm for optimum function.
4. Stable cardiomegaly after aortic root placement.

## 2023-04-21 IMAGING — DX DG CHEST 1V PORT
1 series · 1 of 1 positions shown · non-contrast
Comparison: 07/06/2021 and older studies.

CLINICAL DATA: Status post aortic valve replacement. Follow-up
exam.

EXAM:
PORTABLE CHEST 1 VIEW

[chest]
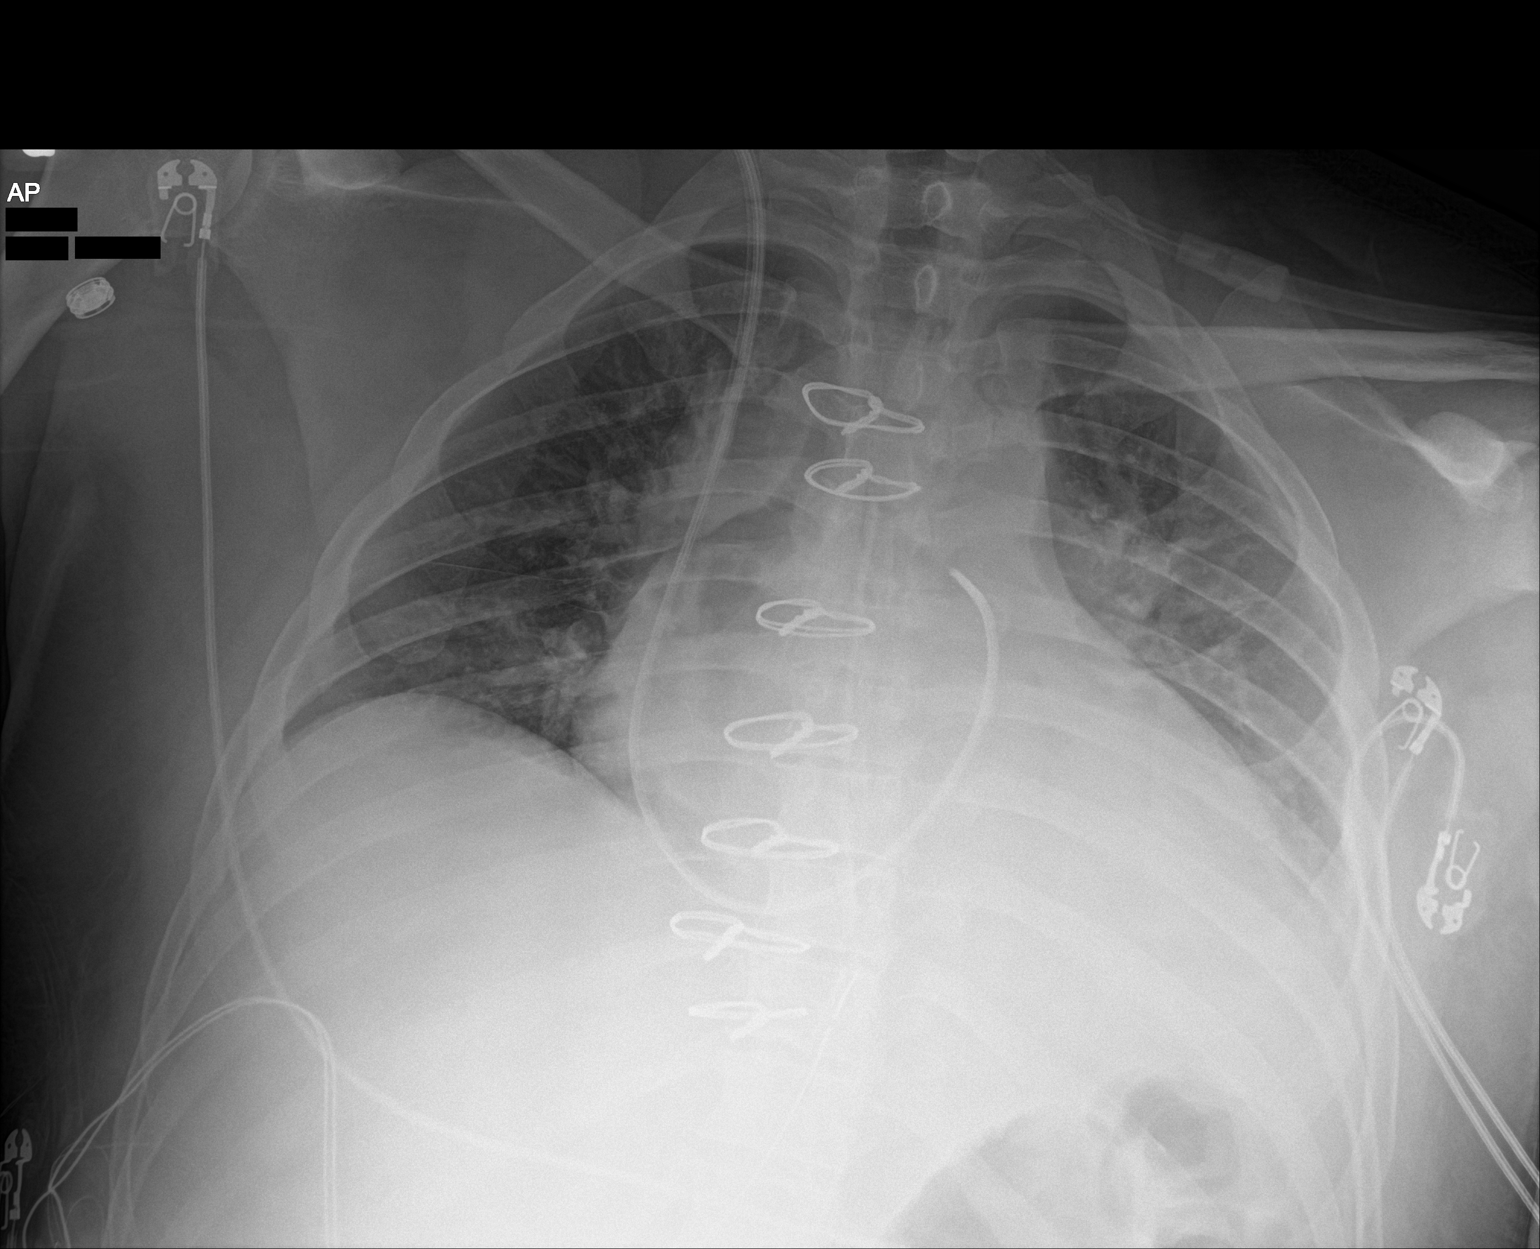

[1 of 1 positions shown; findings below may reference images not displayed]

FINDINGS: Since the previous day's exam, the endotracheal tube and
nasal/orogastric tube have been removed. The mediastinal tube and
right internal jugular Swan-Ganz catheter are unchanged.

Stable cardiac silhouette.  No mediastinal widening.

There is opacity at the lung base that now obscures the
hemidiaphragm, increased from the previous day's study, most likely
a combination of pleural fluid and atelectasis. Bilateral central
vascular congestion is accentuated by low lung volumes. Remainder of
the lungs is clear.

No pneumothorax.
IMPRESSION: 1. Increased opacity at the left lung base compared to the previous
day's exam, most likely a combination of a small effusion and
atelectasis.
2. No convincing pulmonary edema. No pneumothorax and no mediastinal
widening.
3. Remaining support apparatus is stable and well positioned.

## 2023-04-23 IMAGING — DX DG CHEST 1V PORT
1 series · 1 of 1 positions shown · non-contrast
Comparison: Radiographs 07/08/2021 and 07/07/2021.  CT 07/02/2021.

CLINICAL DATA: Recent aortic valve replacement. Chest tube removal.

EXAM:
PORTABLE CHEST 1 VIEW

[chest]
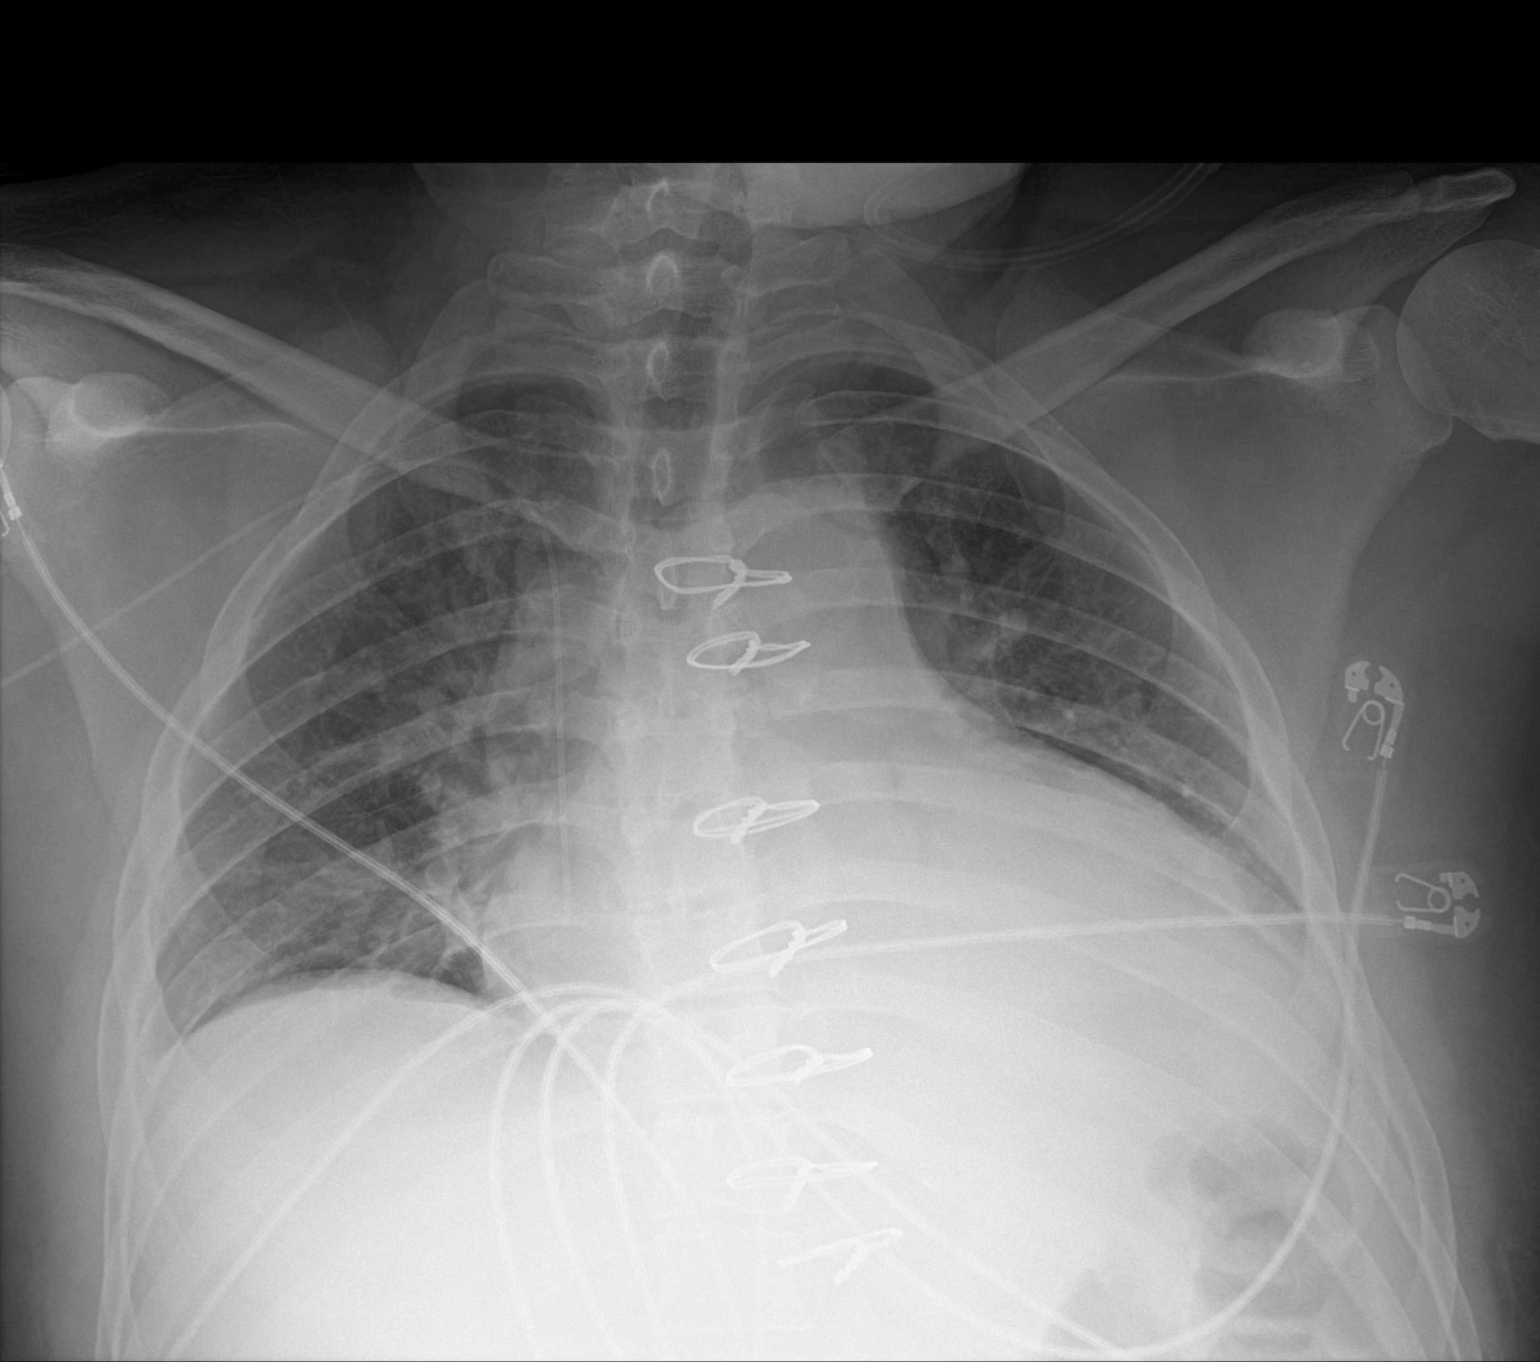

[1 of 1 positions shown; findings below may reference images not displayed]

FINDINGS: [id number] hours. Right arm PICC has been placed, projecting to the level
of the upper right atrium. The heart size and mediastinal contours
are stable status post median sternotomy. A mediastinal drain has
been removed in the interval. There is persistent left lower lobe
airspace disease and a small left pleural effusion. No evidence of
pneumothorax. Multiple telemetry leads overlie the chest.
IMPRESSION: Unchanged left lower lobe airspace disease and probable small left
pleural effusion. Right arm PICC projects to the level of the upper
right atrium.
